# Patient Record
Sex: Female | Born: 1940
Health system: Southern US, Community
[De-identification: ages and names within clinical notes are randomized; demographics above are authoritative.]

## PROBLEM LIST (undated history)

## (undated) DIAGNOSIS — R0602 Shortness of breath: Secondary | ICD-10-CM

## (undated) DIAGNOSIS — M48061 Spinal stenosis, lumbar region without neurogenic claudication: Secondary | ICD-10-CM

## (undated) DIAGNOSIS — N390 Urinary tract infection, site not specified: Secondary | ICD-10-CM

## (undated) DIAGNOSIS — M1710 Unilateral primary osteoarthritis, unspecified knee: Secondary | ICD-10-CM

## (undated) DIAGNOSIS — N200 Calculus of kidney: Secondary | ICD-10-CM

## (undated) DIAGNOSIS — E1142 Type 2 diabetes mellitus with diabetic polyneuropathy: Secondary | ICD-10-CM

## (undated) DIAGNOSIS — M7989 Other specified soft tissue disorders: Secondary | ICD-10-CM

## (undated) DIAGNOSIS — G4733 Obstructive sleep apnea (adult) (pediatric): Secondary | ICD-10-CM

## (undated) DIAGNOSIS — M170 Bilateral primary osteoarthritis of knee: Secondary | ICD-10-CM

## (undated) DIAGNOSIS — Z7409 Other reduced mobility: Secondary | ICD-10-CM

## (undated) DIAGNOSIS — M25561 Pain in right knee: Secondary | ICD-10-CM

## (undated) DIAGNOSIS — N2581 Secondary hyperparathyroidism of renal origin: Secondary | ICD-10-CM

## (undated) DIAGNOSIS — M5432 Sciatica, left side: Secondary | ICD-10-CM

## (undated) DIAGNOSIS — R700 Elevated erythrocyte sedimentation rate: Secondary | ICD-10-CM

## (undated) DIAGNOSIS — G894 Chronic pain syndrome: Secondary | ICD-10-CM

## (undated) DIAGNOSIS — E1129 Type 2 diabetes mellitus with other diabetic kidney complication: Secondary | ICD-10-CM

## (undated) HISTORY — DX: Pain in right knee: M25.561

## (undated) HISTORY — DX: Spinal stenosis, lumbar region without neurogenic claudication: M48.061

## (undated) HISTORY — PX: OTHER SURGICAL HISTORY: SHX169

## (undated) HISTORY — DX: Other reduced mobility: Z74.09

## (undated) HISTORY — DX: Unilateral primary osteoarthritis, unspecified knee: M17.10

## (undated) HISTORY — DX: Urinary tract infection, site not specified: N39.0

## (undated) HISTORY — DX: Chronic pain syndrome: G89.4

## (undated) HISTORY — DX: Type 2 diabetes mellitus with diabetic polyneuropathy: E11.42

## (undated) HISTORY — DX: Elevated erythrocyte sedimentation rate: R70.0

## (undated) HISTORY — DX: Calculus of kidney: N20.0

## (undated) HISTORY — DX: Secondary hyperparathyroidism of renal origin: N25.81

## (undated) HISTORY — DX: Sciatica, left side: M54.32

## (undated) HISTORY — DX: Obstructive sleep apnea (adult) (pediatric): G47.33

---
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
## 1991-03-06 HISTORY — PX: COLPOSCOPY: SHX161

## 1993-07-05 HISTORY — PX: OTHER SURGICAL HISTORY: SHX169

## 1998-09-17 ENCOUNTER — Encounter: Admission: RE | Admit: 1998-09-17 | Discharge: 1998-09-17 | Payer: Self-pay | Admitting: Family Medicine

## 1998-10-21 ENCOUNTER — Encounter: Admission: RE | Admit: 1998-10-21 | Discharge: 1998-10-21 | Payer: Self-pay | Admitting: Family Medicine

## 1998-12-03 ENCOUNTER — Encounter: Admission: RE | Admit: 1998-12-03 | Discharge: 1998-12-03 | Payer: Self-pay | Admitting: Family Medicine

## 1999-07-18 ENCOUNTER — Encounter: Admission: RE | Admit: 1999-07-18 | Discharge: 1999-07-18 | Payer: Self-pay | Admitting: Family Medicine

## 1999-09-24 ENCOUNTER — Encounter: Admission: RE | Admit: 1999-09-24 | Discharge: 1999-09-24 | Payer: Self-pay | Admitting: Family Medicine

## 1999-11-04 ENCOUNTER — Encounter: Admission: RE | Admit: 1999-11-04 | Discharge: 1999-11-04 | Payer: Self-pay | Admitting: Family Medicine

## 1999-11-26 ENCOUNTER — Encounter: Admission: RE | Admit: 1999-11-26 | Discharge: 1999-11-26 | Payer: Self-pay | Admitting: Family Medicine

## 1999-12-23 ENCOUNTER — Encounter: Admission: RE | Admit: 1999-12-23 | Discharge: 1999-12-23 | Payer: Self-pay | Admitting: Family Medicine

## 2000-01-02 ENCOUNTER — Encounter: Admission: RE | Admit: 2000-01-02 | Discharge: 2000-01-02 | Payer: Self-pay | Admitting: Family Medicine

## 2000-02-24 ENCOUNTER — Encounter: Admission: RE | Admit: 2000-02-24 | Discharge: 2000-02-24 | Payer: Self-pay | Admitting: Sports Medicine

## 2000-03-30 ENCOUNTER — Encounter: Admission: RE | Admit: 2000-03-30 | Discharge: 2000-03-30 | Payer: Self-pay | Admitting: Family Medicine

## 2000-05-21 ENCOUNTER — Encounter: Admission: RE | Admit: 2000-05-21 | Discharge: 2000-05-21 | Payer: Self-pay | Admitting: Family Medicine

## 2000-07-20 ENCOUNTER — Encounter: Admission: RE | Admit: 2000-07-20 | Discharge: 2000-07-20 | Payer: Self-pay | Admitting: Family Medicine

## 2000-07-27 ENCOUNTER — Encounter: Admission: RE | Admit: 2000-07-27 | Discharge: 2000-07-27 | Payer: Self-pay | Admitting: Family Medicine

## 2000-08-03 ENCOUNTER — Encounter: Admission: RE | Admit: 2000-08-03 | Discharge: 2000-08-03 | Payer: Self-pay | Admitting: Family Medicine

## 2000-10-12 ENCOUNTER — Encounter: Admission: RE | Admit: 2000-10-12 | Discharge: 2000-10-12 | Payer: Self-pay | Admitting: Family Medicine

## 2000-11-17 ENCOUNTER — Encounter: Admission: RE | Admit: 2000-11-17 | Discharge: 2000-11-17 | Payer: Self-pay | Admitting: Family Medicine

## 2001-04-04 DIAGNOSIS — M179 Osteoarthritis of knee, unspecified: Secondary | ICD-10-CM

## 2001-04-04 DIAGNOSIS — M1710 Unilateral primary osteoarthritis, unspecified knee: Secondary | ICD-10-CM

## 2001-04-04 HISTORY — DX: Osteoarthritis of knee, unspecified: M17.9

## 2001-04-04 HISTORY — DX: Unilateral primary osteoarthritis, unspecified knee: M17.10

## 2001-04-19 ENCOUNTER — Encounter: Admission: RE | Admit: 2001-04-19 | Discharge: 2001-04-19 | Payer: Self-pay | Admitting: Family Medicine

## 2001-04-19 ENCOUNTER — Encounter: Payer: Self-pay | Admitting: Family Medicine

## 2001-05-04 ENCOUNTER — Encounter: Admission: RE | Admit: 2001-05-04 | Discharge: 2001-05-04 | Payer: Self-pay | Admitting: Family Medicine

## 2001-09-20 ENCOUNTER — Encounter: Admission: RE | Admit: 2001-09-20 | Discharge: 2001-09-20 | Payer: Self-pay | Admitting: Family Medicine

## 2001-10-14 ENCOUNTER — Encounter: Admission: RE | Admit: 2001-10-14 | Discharge: 2001-10-14 | Payer: Self-pay | Admitting: Family Medicine

## 2002-05-09 ENCOUNTER — Encounter: Admission: RE | Admit: 2002-05-09 | Discharge: 2002-05-09 | Payer: Self-pay | Admitting: Family Medicine

## 2002-06-23 ENCOUNTER — Encounter: Admission: RE | Admit: 2002-06-23 | Discharge: 2002-06-23 | Payer: Self-pay | Admitting: Family Medicine

## 2002-07-17 ENCOUNTER — Encounter: Admission: RE | Admit: 2002-07-17 | Discharge: 2002-07-17 | Payer: Self-pay | Admitting: Family Medicine

## 2002-07-18 ENCOUNTER — Encounter: Admission: RE | Admit: 2002-07-18 | Discharge: 2002-07-18 | Payer: Self-pay | Admitting: Sports Medicine

## 2002-07-18 ENCOUNTER — Encounter: Payer: Self-pay | Admitting: Sports Medicine

## 2002-09-12 ENCOUNTER — Encounter: Admission: RE | Admit: 2002-09-12 | Discharge: 2002-09-12 | Payer: Self-pay | Admitting: Family Medicine

## 2002-10-10 ENCOUNTER — Encounter: Admission: RE | Admit: 2002-10-10 | Discharge: 2002-10-10 | Payer: Self-pay | Admitting: Family Medicine

## 2003-02-02 ENCOUNTER — Encounter: Admission: RE | Admit: 2003-02-02 | Discharge: 2003-02-02 | Payer: Self-pay | Admitting: Family Medicine

## 2003-02-06 ENCOUNTER — Encounter: Admission: RE | Admit: 2003-02-06 | Discharge: 2003-02-06 | Payer: Self-pay | Admitting: Family Medicine

## 2003-04-03 ENCOUNTER — Encounter: Admission: RE | Admit: 2003-04-03 | Discharge: 2003-04-03 | Payer: Self-pay | Admitting: Family Medicine

## 2003-04-25 ENCOUNTER — Encounter: Admission: RE | Admit: 2003-04-25 | Discharge: 2003-04-25 | Payer: Self-pay | Admitting: Family Medicine

## 2003-08-02 ENCOUNTER — Encounter: Admission: RE | Admit: 2003-08-02 | Discharge: 2003-08-02 | Payer: Self-pay | Admitting: Family Medicine

## 2003-11-23 ENCOUNTER — Encounter: Admission: RE | Admit: 2003-11-23 | Discharge: 2003-11-23 | Payer: Self-pay | Admitting: Family Medicine

## 2003-11-24 ENCOUNTER — Emergency Department (HOSPITAL_COMMUNITY): Admission: EM | Admit: 2003-11-24 | Discharge: 2003-11-24 | Payer: Self-pay | Admitting: Family Medicine

## 2003-12-04 ENCOUNTER — Encounter: Admission: RE | Admit: 2003-12-04 | Discharge: 2003-12-04 | Payer: Self-pay | Admitting: Family Medicine

## 2003-12-04 DIAGNOSIS — M5432 Sciatica, left side: Secondary | ICD-10-CM

## 2003-12-04 HISTORY — DX: Sciatica, left side: M54.32

## 2003-12-18 ENCOUNTER — Encounter: Admission: RE | Admit: 2003-12-18 | Discharge: 2003-12-18 | Payer: Self-pay | Admitting: Family Medicine

## 2003-12-25 ENCOUNTER — Encounter: Admission: RE | Admit: 2003-12-25 | Discharge: 2003-12-25 | Payer: Self-pay | Admitting: Family Medicine

## 2003-12-25 ENCOUNTER — Encounter: Admission: RE | Admit: 2003-12-25 | Discharge: 2003-12-25 | Payer: Self-pay | Admitting: Sports Medicine

## 2004-01-01 ENCOUNTER — Encounter: Admission: RE | Admit: 2004-01-01 | Discharge: 2004-01-01 | Payer: Self-pay | Admitting: Family Medicine

## 2004-01-15 ENCOUNTER — Encounter: Admission: RE | Admit: 2004-01-15 | Discharge: 2004-01-15 | Payer: Self-pay | Admitting: Family Medicine

## 2004-01-23 ENCOUNTER — Encounter: Admission: RE | Admit: 2004-01-23 | Discharge: 2004-02-29 | Payer: Self-pay | Admitting: Family Medicine

## 2004-01-29 ENCOUNTER — Encounter: Admission: RE | Admit: 2004-01-29 | Discharge: 2004-01-29 | Payer: Self-pay | Admitting: Sports Medicine

## 2004-02-26 ENCOUNTER — Encounter: Admission: RE | Admit: 2004-02-26 | Discharge: 2004-02-26 | Payer: Self-pay | Admitting: Sports Medicine

## 2004-03-21 ENCOUNTER — Encounter: Admission: RE | Admit: 2004-03-21 | Discharge: 2004-03-21 | Payer: Self-pay | Admitting: Family Medicine

## 2004-03-25 ENCOUNTER — Encounter: Admission: RE | Admit: 2004-03-25 | Discharge: 2004-03-25 | Payer: Self-pay | Admitting: Family Medicine

## 2004-03-29 ENCOUNTER — Encounter: Admission: RE | Admit: 2004-03-29 | Discharge: 2004-03-29 | Payer: Self-pay | Admitting: Family Medicine

## 2004-04-04 ENCOUNTER — Encounter (INDEPENDENT_AMBULATORY_CARE_PROVIDER_SITE_OTHER): Payer: Self-pay | Admitting: *Deleted

## 2004-04-04 DIAGNOSIS — M48061 Spinal stenosis, lumbar region without neurogenic claudication: Secondary | ICD-10-CM

## 2004-04-04 HISTORY — DX: Spinal stenosis, lumbar region without neurogenic claudication: M48.061

## 2004-04-04 LAB — CONVERTED CEMR LAB

## 2004-04-22 ENCOUNTER — Encounter: Admission: RE | Admit: 2004-04-22 | Discharge: 2004-04-22 | Payer: Self-pay | Admitting: Family Medicine

## 2004-04-22 ENCOUNTER — Encounter: Payer: Self-pay | Admitting: Family Medicine

## 2004-05-06 ENCOUNTER — Encounter: Admission: RE | Admit: 2004-05-06 | Discharge: 2004-05-06 | Payer: Self-pay | Admitting: Family Medicine

## 2004-07-21 ENCOUNTER — Ambulatory Visit: Payer: Self-pay | Admitting: Sports Medicine

## 2004-09-03 ENCOUNTER — Emergency Department (HOSPITAL_COMMUNITY): Admission: EM | Admit: 2004-09-03 | Discharge: 2004-09-03 | Payer: Self-pay | Admitting: Family Medicine

## 2004-09-09 ENCOUNTER — Ambulatory Visit: Payer: Self-pay | Admitting: Family Medicine

## 2004-10-07 ENCOUNTER — Ambulatory Visit: Payer: Self-pay | Admitting: Family Medicine

## 2004-12-02 ENCOUNTER — Ambulatory Visit: Payer: Self-pay | Admitting: Family Medicine

## 2004-12-09 ENCOUNTER — Ambulatory Visit: Payer: Self-pay | Admitting: Family Medicine

## 2004-12-30 ENCOUNTER — Ambulatory Visit: Payer: Self-pay | Admitting: Family Medicine

## 2005-01-05 ENCOUNTER — Emergency Department (HOSPITAL_COMMUNITY): Admission: EM | Admit: 2005-01-05 | Discharge: 2005-01-05 | Payer: Self-pay | Admitting: Family Medicine

## 2005-07-07 ENCOUNTER — Ambulatory Visit: Payer: Self-pay | Admitting: Family Medicine

## 2006-04-06 ENCOUNTER — Ambulatory Visit: Payer: Self-pay | Admitting: Family Medicine

## 2006-07-14 ENCOUNTER — Ambulatory Visit: Payer: Self-pay | Admitting: Sports Medicine

## 2006-09-03 ENCOUNTER — Ambulatory Visit: Payer: Self-pay | Admitting: Family Medicine

## 2006-09-07 ENCOUNTER — Ambulatory Visit: Payer: Self-pay | Admitting: Family Medicine

## 2006-12-02 DIAGNOSIS — M5136 Other intervertebral disc degeneration, lumbar region: Secondary | ICD-10-CM

## 2006-12-02 DIAGNOSIS — F339 Major depressive disorder, recurrent, unspecified: Secondary | ICD-10-CM | POA: Insufficient documentation

## 2006-12-02 DIAGNOSIS — E1149 Type 2 diabetes mellitus with other diabetic neurological complication: Secondary | ICD-10-CM | POA: Insufficient documentation

## 2006-12-02 DIAGNOSIS — J329 Chronic sinusitis, unspecified: Secondary | ICD-10-CM | POA: Insufficient documentation

## 2006-12-02 DIAGNOSIS — M159 Polyosteoarthritis, unspecified: Secondary | ICD-10-CM | POA: Insufficient documentation

## 2006-12-02 DIAGNOSIS — I2510 Atherosclerotic heart disease of native coronary artery without angina pectoris: Secondary | ICD-10-CM

## 2006-12-02 DIAGNOSIS — I10 Essential (primary) hypertension: Secondary | ICD-10-CM

## 2006-12-02 DIAGNOSIS — E7800 Pure hypercholesterolemia, unspecified: Secondary | ICD-10-CM

## 2006-12-02 DIAGNOSIS — N3941 Urge incontinence: Secondary | ICD-10-CM

## 2006-12-03 ENCOUNTER — Encounter (INDEPENDENT_AMBULATORY_CARE_PROVIDER_SITE_OTHER): Payer: Self-pay | Admitting: *Deleted

## 2007-03-25 ENCOUNTER — Telehealth (INDEPENDENT_AMBULATORY_CARE_PROVIDER_SITE_OTHER): Payer: Self-pay

## 2007-04-18 ENCOUNTER — Encounter: Payer: Self-pay | Admitting: Family Medicine

## 2007-05-03 ENCOUNTER — Ambulatory Visit: Payer: Self-pay | Admitting: Family Medicine

## 2007-05-03 ENCOUNTER — Encounter: Payer: Self-pay | Admitting: Family Medicine

## 2007-05-03 LAB — CONVERTED CEMR LAB
BUN: 26 mg/dL — ABNORMAL HIGH (ref 6–23)
CO2: 24 meq/L (ref 19–32)
Calcium: 9.4 mg/dL (ref 8.4–10.5)
Chloride: 100 meq/L (ref 96–112)
Cholesterol: 139 mg/dL (ref 0–200)
Creatinine, Ser: 1.1 mg/dL (ref 0.40–1.20)
Glucose, Bld: 95 mg/dL (ref 70–99)
HDL: 39 mg/dL — ABNORMAL LOW (ref >39)
LDL Cholesterol: 52 mg/dL (ref 0–99)
Potassium: 4.6 meq/L (ref 3.5–5.3)
Sodium: 138 meq/L (ref 135–145)
TSH: 1.614 microintl units/mL (ref 0.350–5.50)
Total CHOL/HDL Ratio: 3.6
Triglycerides: 241 mg/dL — ABNORMAL HIGH (ref <150)
VLDL: 48 mg/dL — ABNORMAL HIGH (ref 0–40)

## 2007-05-04 ENCOUNTER — Telehealth: Payer: Self-pay | Admitting: Family Medicine

## 2007-05-04 ENCOUNTER — Encounter: Payer: Self-pay | Admitting: Family Medicine

## 2007-07-20 ENCOUNTER — Ambulatory Visit: Payer: Self-pay | Admitting: Family Medicine

## 2007-08-06 ENCOUNTER — Emergency Department (HOSPITAL_COMMUNITY): Admission: EM | Admit: 2007-08-06 | Discharge: 2007-08-06 | Payer: Self-pay | Admitting: Emergency Medicine

## 2007-08-11 ENCOUNTER — Ambulatory Visit: Payer: Self-pay | Admitting: Family Medicine

## 2008-05-22 ENCOUNTER — Telehealth: Payer: Self-pay | Admitting: Family Medicine

## 2008-06-05 ENCOUNTER — Ambulatory Visit: Payer: Self-pay | Admitting: Family Medicine

## 2008-06-05 LAB — CONVERTED CEMR LAB
AST: 15 units/L (ref 0–37)
Albumin: 4.4 g/dL (ref 3.5–5.2)
BUN: 25 mg/dL — ABNORMAL HIGH (ref 6–23)
CO2: 24 meq/L (ref 19–32)
Calcium: 9.3 mg/dL (ref 8.4–10.5)
Chloride: 97 meq/L (ref 96–112)
Cholesterol: 142 mg/dL (ref 0–200)
Glucose, Bld: 161 mg/dL — ABNORMAL HIGH (ref 70–99)
HDL: 34 mg/dL — ABNORMAL LOW (ref >39)
Hemoglobin: 15.1 g/dL — ABNORMAL HIGH (ref 12.0–15.0)
Potassium: 3.8 meq/L (ref 3.5–5.3)
RBC: 4.86 M/uL (ref 3.87–5.11)
Triglycerides: 350 mg/dL — ABNORMAL HIGH (ref <150)
Uric Acid, Serum: 8.2 mg/dL — ABNORMAL HIGH (ref 2.4–7.0)
WBC: 9.9 10*3/uL (ref 4.0–10.5)

## 2008-06-07 ENCOUNTER — Encounter: Payer: Self-pay | Admitting: Family Medicine

## 2008-06-27 ENCOUNTER — Ambulatory Visit: Payer: Self-pay | Admitting: Family Medicine

## 2009-01-08 ENCOUNTER — Ambulatory Visit: Payer: Self-pay | Admitting: Family Medicine

## 2009-01-08 DIAGNOSIS — J301 Allergic rhinitis due to pollen: Secondary | ICD-10-CM

## 2009-01-22 ENCOUNTER — Ambulatory Visit (HOSPITAL_COMMUNITY): Admission: RE | Admit: 2009-01-22 | Discharge: 2009-01-22 | Payer: Self-pay | Admitting: Family Medicine

## 2009-01-22 ENCOUNTER — Ambulatory Visit: Payer: Self-pay | Admitting: Family Medicine

## 2009-01-22 DIAGNOSIS — I4821 Permanent atrial fibrillation: Secondary | ICD-10-CM

## 2009-01-22 LAB — CONVERTED CEMR LAB
CO2: 25 meq/L (ref 19–32)
Chloride: 99 meq/L (ref 96–112)
Creatinine, Ser: 1.06 mg/dL (ref 0.40–1.20)
HCT: 42.2 % (ref 36.0–46.0)
Hemoglobin: 14.4 g/dL (ref 12.0–15.0)
RDW: 14.3 % (ref 11.5–15.5)
aPTT: 30 s (ref 24–37)

## 2009-01-24 ENCOUNTER — Telehealth (INDEPENDENT_AMBULATORY_CARE_PROVIDER_SITE_OTHER): Payer: Self-pay | Admitting: *Deleted

## 2009-01-25 ENCOUNTER — Encounter: Payer: Self-pay | Admitting: Family Medicine

## 2009-01-28 ENCOUNTER — Encounter: Payer: Self-pay | Admitting: Family Medicine

## 2009-01-29 ENCOUNTER — Ambulatory Visit: Payer: Self-pay | Admitting: Family Medicine

## 2009-01-29 ENCOUNTER — Ambulatory Visit (HOSPITAL_COMMUNITY): Admission: RE | Admit: 2009-01-29 | Discharge: 2009-01-29 | Payer: Self-pay | Admitting: Family Medicine

## 2009-01-30 ENCOUNTER — Encounter: Payer: Self-pay | Admitting: Family Medicine

## 2009-01-30 ENCOUNTER — Ambulatory Visit (HOSPITAL_COMMUNITY): Admission: RE | Admit: 2009-01-30 | Discharge: 2009-01-30 | Payer: Self-pay | Admitting: Family Medicine

## 2009-01-30 ENCOUNTER — Ambulatory Visit: Payer: Self-pay | Admitting: Internal Medicine

## 2009-01-31 ENCOUNTER — Ambulatory Visit: Payer: Self-pay | Admitting: Family Medicine

## 2009-01-31 ENCOUNTER — Encounter: Payer: Self-pay | Admitting: Internal Medicine

## 2009-01-31 ENCOUNTER — Encounter: Payer: Self-pay | Admitting: Family Medicine

## 2009-01-31 LAB — CONVERTED CEMR LAB: INR: 1.7

## 2009-02-01 ENCOUNTER — Ambulatory Visit: Payer: Self-pay | Admitting: Family Medicine

## 2009-02-01 ENCOUNTER — Encounter: Payer: Self-pay | Admitting: Family Medicine

## 2009-02-01 LAB — CONVERTED CEMR LAB: INR: 2.7

## 2009-02-04 ENCOUNTER — Ambulatory Visit: Payer: Self-pay | Admitting: Family Medicine

## 2009-02-08 ENCOUNTER — Ambulatory Visit: Payer: Self-pay | Admitting: Family Medicine

## 2009-02-12 ENCOUNTER — Ambulatory Visit: Payer: Self-pay | Admitting: Family Medicine

## 2009-02-12 LAB — CONVERTED CEMR LAB: INR: 2.3

## 2009-02-19 ENCOUNTER — Ambulatory Visit: Payer: Self-pay | Admitting: Family Medicine

## 2009-02-19 LAB — CONVERTED CEMR LAB: INR: 1.9

## 2009-02-20 ENCOUNTER — Ambulatory Visit: Payer: Self-pay | Admitting: Internal Medicine

## 2009-02-26 ENCOUNTER — Ambulatory Visit: Payer: Self-pay | Admitting: Family Medicine

## 2009-02-26 LAB — CONVERTED CEMR LAB: INR: 1.9

## 2009-03-05 ENCOUNTER — Ambulatory Visit: Payer: Self-pay | Admitting: Family Medicine

## 2009-03-05 LAB — CONVERTED CEMR LAB: INR: 1.8

## 2009-03-19 ENCOUNTER — Ambulatory Visit: Payer: Self-pay | Admitting: Family Medicine

## 2009-04-16 ENCOUNTER — Ambulatory Visit: Payer: Self-pay | Admitting: Family Medicine

## 2009-04-16 LAB — CONVERTED CEMR LAB: INR: 2.4

## 2009-05-14 ENCOUNTER — Ambulatory Visit: Payer: Self-pay | Admitting: Family Medicine

## 2009-05-14 LAB — CONVERTED CEMR LAB

## 2009-06-13 ENCOUNTER — Ambulatory Visit: Payer: Self-pay | Admitting: Family Medicine

## 2009-07-11 ENCOUNTER — Ambulatory Visit: Payer: Self-pay | Admitting: Family Medicine

## 2009-08-08 ENCOUNTER — Ambulatory Visit: Payer: Self-pay | Admitting: Family Medicine

## 2009-08-08 LAB — CONVERTED CEMR LAB: INR: 2.4

## 2009-09-03 ENCOUNTER — Ambulatory Visit: Payer: Self-pay | Admitting: Family Medicine

## 2009-09-03 LAB — CONVERTED CEMR LAB
Hgb A1c MFr Bld: 6.5 %
INR: 2.2

## 2009-09-04 ENCOUNTER — Encounter: Payer: Self-pay | Admitting: Family Medicine

## 2009-09-04 LAB — CONVERTED CEMR LAB
ALT: 12 units/L (ref 0–35)
AST: 12 units/L (ref 0–37)
Albumin: 4.4 g/dL (ref 3.5–5.2)
Alkaline Phosphatase: 54 units/L (ref 39–117)
BUN: 24 mg/dL — ABNORMAL HIGH (ref 6–23)
Calcium: 9.8 mg/dL (ref 8.4–10.5)
Chloride: 101 meq/L (ref 96–112)
Potassium: 4.2 meq/L (ref 3.5–5.3)
Sodium: 139 meq/L (ref 135–145)
Total Protein: 7 g/dL (ref 6.0–8.3)

## 2009-09-24 ENCOUNTER — Encounter: Payer: Self-pay | Admitting: Family Medicine

## 2009-10-01 ENCOUNTER — Ambulatory Visit: Payer: Self-pay | Admitting: Family Medicine

## 2009-10-23 ENCOUNTER — Encounter: Payer: Self-pay | Admitting: Family Medicine

## 2009-10-23 ENCOUNTER — Telehealth: Payer: Self-pay | Admitting: Family Medicine

## 2009-10-29 ENCOUNTER — Encounter: Payer: Self-pay | Admitting: Family Medicine

## 2009-10-29 ENCOUNTER — Ambulatory Visit: Payer: Self-pay | Admitting: Family Medicine

## 2009-10-29 LAB — CONVERTED CEMR LAB
HDL: 38 mg/dL — ABNORMAL LOW (ref >39)
INR: 2.6
LDL Cholesterol: 30 mg/dL (ref 0–99)

## 2009-11-12 ENCOUNTER — Ambulatory Visit: Payer: Self-pay | Admitting: Family Medicine

## 2009-11-26 ENCOUNTER — Ambulatory Visit: Payer: Self-pay | Admitting: Family Medicine

## 2009-11-26 ENCOUNTER — Encounter: Payer: Self-pay | Admitting: Family Medicine

## 2009-11-26 LAB — CONVERTED CEMR LAB
HCT: 46.8 % — ABNORMAL HIGH (ref 36.0–46.0)
Hemoglobin: 15.6 g/dL — ABNORMAL HIGH (ref 12.0–15.0)
INR: 2.3
MCHC: 33.3 g/dL (ref 30.0–36.0)
MCV: 94 fL (ref 78.0–100.0)
RBC: 4.98 M/uL (ref 3.87–5.11)

## 2009-11-27 ENCOUNTER — Encounter: Payer: Self-pay | Admitting: Family Medicine

## 2009-12-13 ENCOUNTER — Encounter: Payer: Self-pay | Admitting: Family Medicine

## 2009-12-16 ENCOUNTER — Encounter: Payer: Self-pay | Admitting: Family Medicine

## 2009-12-24 ENCOUNTER — Ambulatory Visit: Payer: Self-pay | Admitting: Family Medicine

## 2010-01-21 ENCOUNTER — Ambulatory Visit: Payer: Self-pay | Admitting: Family Medicine

## 2010-02-06 ENCOUNTER — Encounter: Payer: Self-pay | Admitting: Family Medicine

## 2010-02-18 ENCOUNTER — Ambulatory Visit: Payer: Self-pay | Admitting: Family Medicine

## 2010-03-04 ENCOUNTER — Ambulatory Visit: Payer: Self-pay | Admitting: Family Medicine

## 2010-03-04 LAB — CONVERTED CEMR LAB
CO2: 24 meq/L (ref 19–32)
Chloride: 101 meq/L (ref 96–112)
Glucose, Bld: 106 mg/dL — ABNORMAL HIGH (ref 70–99)
Potassium: 4.3 meq/L (ref 3.5–5.3)
Sodium: 139 meq/L (ref 135–145)

## 2010-03-05 ENCOUNTER — Encounter: Payer: Self-pay | Admitting: Family Medicine

## 2010-03-18 ENCOUNTER — Ambulatory Visit: Payer: Self-pay | Admitting: Family Medicine

## 2010-03-25 ENCOUNTER — Encounter: Payer: Self-pay | Admitting: *Deleted

## 2010-04-15 ENCOUNTER — Ambulatory Visit: Payer: Self-pay | Admitting: Family Medicine

## 2010-05-20 ENCOUNTER — Ambulatory Visit: Payer: Self-pay | Admitting: Family Medicine

## 2010-05-20 LAB — CONVERTED CEMR LAB: Hgb A1c MFr Bld: 6.1 %

## 2010-06-17 ENCOUNTER — Ambulatory Visit: Payer: Self-pay | Admitting: Family Medicine

## 2010-06-17 LAB — CONVERTED CEMR LAB: INR: 2.6

## 2010-07-15 ENCOUNTER — Ambulatory Visit: Payer: Self-pay | Admitting: Family Medicine

## 2010-08-12 ENCOUNTER — Ambulatory Visit: Payer: Self-pay | Admitting: Family Medicine

## 2010-08-12 LAB — CONVERTED CEMR LAB: INR: 2.3

## 2010-09-09 ENCOUNTER — Ambulatory Visit: Payer: Self-pay | Admitting: Family Medicine

## 2010-09-09 LAB — CONVERTED CEMR LAB: INR: 2.5

## 2010-09-16 ENCOUNTER — Ambulatory Visit: Payer: Self-pay | Admitting: Family Medicine

## 2010-09-16 LAB — CONVERTED CEMR LAB: Hgb A1c MFr Bld: 6.4 %

## 2010-10-14 ENCOUNTER — Ambulatory Visit: Admission: RE | Admit: 2010-10-14 | Discharge: 2010-10-14 | Payer: Self-pay | Source: Home / Self Care

## 2010-11-04 NOTE — Letter (Signed)
Summary: Results Follow-up Letter  [facility]  [address]   [place], [place] [address]   Phone: [phone]  Fax: [phone]    03/05/2010  [place], [place]  [address]  Dear Ms. Degidio,   The following are the results of your recent test(s):  Tests: (1) Basic Metabolic Panel (A999333)   Sodium                    139 mEq/L                   135-145   Potassium                 4.3 mEq/L                   3.5-5.3   Chloride                  101 mEq/L                   96-112   CO2                       24 mEq/L                    19-32   Glucose              [H]  106 mg/dL                   70-99   BUN                  [H]  24 mg/dL                    6-23   Creatinine                1.07 mg/dL                  0.40-1.20   Calcium                   9.4 mg/dL                   8.4-10.5  Your kidney function remains good. I added Enalapril 20 mg to take one daily to decrease your blood pressure and protect your kidneys. Losing weight will also help your blood pressure. Please make an appointment in one month to check your blood pressure  and kidney function.  Don't forget to schedule your mammogram. We could also do your last pap smear then.   03/04/2010 22:49 Sincerely,  Candelaria Celeste MD [facility]           Appended Document: Results Follow-up Letter mailed.

## 2010-11-04 NOTE — Assessment & Plan Note (Signed)
Summary: F/U VISIT AND INR CHECK/[facility]   Vital Signs:  Patient profile:   70 year old female Height:      63 inches Weight:      263 pounds BMI:     46.76 Pulse rate:   92 / minute BP sitting:   130 / 88  (right arm)  Vitals Entered By: Mauricia Area CMA, (May 20, 2010 3:03 PM) CC: f/up DM and refill meds. Is Patient Diabetic? Yes Pain Assessment Patient in pain? no        Primary Care Provider:  Candelaria Celeste MD  CC:  f/up DM and refill meds..  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   Her older son is now walking as he recovers from the car accident.   Denies chest pain or palpitations  No hyppglycemic symptoms   Habits & Providers  Alcohol-Tobacco-Diet     Tobacco Status: never  Current Medications (verified): 1)  Acetaminophen 500 Mg Tabs (Acetaminophen) .... As Needed 2)  Nitrostat 0.4 Mg Subl (Nitroglycerin) .... Place 1 Tablet Under Tongue As Directed 3)  Procardia Xl 90 Mg Xr24h-Tab (Nifedipine) .... Take One Tablet Daily 4)  Metoprolol Succinate 100 Mg Xr24h-Tab (Metoprolol Succinate) .... Take One Tablet Twice Daily 5)  Flonase 50 Mcg/act  Susp (Fluticasone Propionate) .... Two Sprays in Each Nostril Daily 6)  Zyrtec Allergy 10 Mg Tabs (Cetirizine Hcl) .... Take One Tablet Daily 7)  Coumadin 5 Mg Tabs (Warfarin Sodium) .... Take As Directed 8)  Metformin Hcl 500 Mg Tabs (Metformin Hcl) .... Take One Tablet Two Times A Day 9)  Enalapril-Hydrochlorothiazide 10-25 Mg Tabs (Enalapril-Hydrochlorothiazide) .... Take One Tablet Daily 10)  Crestor 20 Mg Tabs (Rosuvastatin Calcium) .Marland Kitchen.. 1 Tablet By Mouth Daily At Bedtime 11)  Hydrocodone-Acetaminophen 7.5-500 Mg Tabs (Hydrocodone-Acetaminophen) .... Take One Tab Every 4 Hr Prn 12)  Enalapril Maleate 10 Mg Tabs (Enalapril Maleate) .... Take One Tab Daily 13)  Zostavax 19400 Unt/0.69ml Solr (Zoster Vaccine Live) .... Take As  Directed  Allergies (verified): 1)  Lisinopril (Lisinopril)  Physical Exam  General:  alert, well-nourished, and a very overweight-appearing.   Lungs:  Normal respiratory effort, chest expands symmetrically. Lungs are clear to auscultation, no crackles or wheezes. Heart:  irregularly irregular rhythm at apical rate of 80.  S1 and S2 normal without gallop, murmur, click, rub or other extra sounds.  Extremities:  trace left pedal edema and trace right pedal edema.     Impression & Recommendations:  Problem # 1:  DM (ICD-250.00) Assessment Improved  Her updated medication list for this problem includes:    Metformin Hcl 500 Mg Tabs (Metformin hcl) .Marland Kitchen... Take one tablet two times a day    Enalapril-hydrochlorothiazide 10-25 Mg Tabs (Enalapril-hydrochlorothiazide) .Marland Kitchen... Take one tablet daily    Enalapril Maleate 10 Mg Tabs (Enalapril maleate) .Marland Kitchen... Take one tab daily  Orders: A1C-[facility] [id number]) [place]- Est  Level 4 [id number])  Problem # 2:  SCIATICA (ICD-724.3) Assessment: Unchanged  Her updated medication list for this problem includes:    Acetaminophen 500 Mg Tabs (Acetaminophen) .Marland Kitchen... As needed    Hydrocodone-acetaminophen 7.5-500 Mg Tabs (Hydrocodone-acetaminophen) .Marland Kitchen... Take one tab every 4 hr prn  Orders: [place]- Est  Level 4 [id number])  Problem # 3:  ATRIAL FIBRILLATION (ICD-427.31) Assessment: Unchanged  Her updated medication list for this problem includes:    Procardia Xl 90 Mg Xr24h-tab (Nifedipine) .Marland Kitchen... Take one tablet daily  Metoprolol Succinate 100 Mg Xr24h-tab (Metoprolol succinate) .Marland Kitchen... Take one tablet twice daily    Coumadin 5 Mg Tabs (Warfarin sodium) .Marland Kitchen... Take as directed  Orders: [place]- Est  Level 4 ([id number])  Problem # 4:  OBESITY, NOS (ICD-278.00) 1 lb weight loss Orders: [place]- Est  Level 4 [id number])  Problem # 5:  HYPERTENSION, BENIGN SYSTEMIC (ICD-401.1) Assessment: Improved  Her updated medication list for this problem includes:    Procardia Xl 90 Mg  Xr24h-tab (Nifedipine) .Marland Kitchen... Take one tablet daily    Metoprolol Succinate 100 Mg Xr24h-tab (Metoprolol succinate) .Marland Kitchen... Take one tablet twice daily    Enalapril-hydrochlorothiazide 10-25 Mg Tabs (Enalapril-hydrochlorothiazide) .Marland Kitchen... Take one tablet daily    Enalapril Maleate 10 Mg Tabs (Enalapril maleate) .Marland Kitchen... Take one tab daily  Orders: [place]- Est  Level 4 [id number])  Problem # 6:  CORONARY, ARTERIOSCLEROSIS (ICD-414.00) no symptoms  Her updated medication list for this problem includes:    Nitrostat 0.4 Mg Subl (Nitroglycerin) .Marland Kitchen... Place 1 tablet under tongue as directed    Procardia Xl 90 Mg Xr24h-tab (Nifedipine) .Marland Kitchen... Take one tablet daily    Metoprolol Succinate 100 Mg Xr24h-tab (Metoprolol succinate) .Marland Kitchen... Take one tablet twice daily    Enalapril-hydrochlorothiazide 10-25 Mg Tabs (Enalapril-hydrochlorothiazide) .Marland Kitchen... Take one tablet daily    Enalapril Maleate 10 Mg Tabs (Enalapril maleate) .Marland Kitchen... Take one tab daily  Complete Medication List: 1)  Acetaminophen 500 Mg Tabs (Acetaminophen) .... As needed 2)  Nitrostat 0.4 Mg Subl (Nitroglycerin) .... Place 1 tablet under tongue as directed 3)  Procardia Xl 90 Mg Xr24h-tab (Nifedipine) .... Take one tablet daily 4)  Metoprolol Succinate 100 Mg Xr24h-tab (Metoprolol succinate) .... Take one tablet twice daily 5)  Flonase 50 Mcg/act Susp (Fluticasone propionate) .... Two sprays in each nostril daily 6)  Zyrtec Allergy 10 Mg Tabs (Cetirizine hcl) .... Take one tablet daily 7)  Coumadin 5 Mg Tabs (Warfarin sodium) .... Take as directed 8)  Metformin Hcl 500 Mg Tabs (Metformin hcl) .... Take one tablet two times a day 9)  Enalapril-hydrochlorothiazide 10-25 Mg Tabs (Enalapril-hydrochlorothiazide) .... Take one tablet daily 10)  Crestor 20 Mg Tabs (Rosuvastatin calcium) .Marland Kitchen.. 1 tablet by mouth daily at bedtime 11)  Hydrocodone-acetaminophen 7.5-500 Mg Tabs (Hydrocodone-acetaminophen) .... Take one tab every 4 hr prn 12)  Enalapril Maleate 10 Mg  Tabs (Enalapril maleate) .... Take one tab daily 13)  Zostavax 19400 Unt/0.18ml Solr (Zoster vaccine live) .... Take as directed  Other Orders: INR/PT-[facility] [id number])  Patient Instructions: 1)  Try Fiber One cereal for fiber 2)  Please schedule a follow-up appointment in 3 months .  Prescriptions: HYDROCODONE-ACETAMINOPHEN 7.5-500 MG TABS (HYDROCODONE-ACETAMINOPHEN) Take one tab every 4 hr prn  #100 x 2   Entered and Authorized by:   Candelaria Celeste MD   Signed by:   Candelaria Celeste MD on 05/20/2010   Method used:   Print then Give to Patient   RxID:   [id number] PROCARDIA XL 90 MG XR24H-TAB (NIFEDIPINE) Take one tablet daily  #90 Tablet x 3   Entered and Authorized by:   Candelaria Celeste MD   Signed by:   Candelaria Celeste MD on 05/20/2010   Method used:   Electronically to        [id number] (retail)       [address]       [place], [place]  [address]       Ph: [phone]  Fax: [phone]   RxID:   [id number]    ANTICOAGULATION RECORD PREVIOUS REGIMEN & LAB RESULTS Anticoagulation Diagnosis:  Atrial fibrillation on  01/31/2009 Previous INR Goal Range:  2-3 on  01/31/2009 Previous INR:  2.3 on  04/15/2010 Previous Coumadin Dose(mg):  5MG  TABLET on  01/31/2009 Previous Regimen:  2.5mg  Sun, Tues, Demetrius Charity; 5mg  other days on  04/15/2010  NEW REGIMEN & LAB RESULTS Current INR: 2.8 Regimen: continue 2.5mg  Sun, Tues, Thur; 5mg  other days  Provider: Dr. Walker Kehr Repeat testing in: 4 weeks Other Comments: ...........test performed by...........Marland KitchenHedy Camara, CMA   Dose has been reviewed with patient or caretaker during this visit. Reviewed by: D. Delsa Sale, CMA  Anticoagulation Visit Questionnaire Coumadin dose missed/changed:  No Abnormal Bleeding Symptoms:  No  Any diet changes including alcohol intake, vegetables or greens since the last visit:  No Any illnesses or hospitalizations since the last visit:  No Any signs of clotting since the last  visit (including chest discomfort, dizziness, shortness of breath, arm tingling, slurred speech, swelling or redness in leg):  No  MEDICATIONS ACETAMINOPHEN 500 MG TABS (ACETAMINOPHEN) as needed NITROSTAT 0.4 MG SUBL (NITROGLYCERIN) Place 1 tablet under tongue as directed PROCARDIA XL 90 MG XR24H-TAB (NIFEDIPINE) Take one tablet daily METOPROLOL SUCCINATE 100 MG XR24H-TAB (METOPROLOL SUCCINATE) Take one tablet twice daily FLONASE 50 MCG/ACT  SUSP (FLUTICASONE PROPIONATE) Two sprays in each nostril daily ZYRTEC ALLERGY 10 MG TABS (CETIRIZINE HCL) Take one tablet daily COUMADIN 5 MG TABS (WARFARIN SODIUM) Take as directed METFORMIN HCL 500 MG TABS (METFORMIN HCL) Take one tablet two times a day ENALAPRIL-HYDROCHLOROTHIAZIDE 10-25 MG TABS (ENALAPRIL-HYDROCHLOROTHIAZIDE) Take one tablet daily CRESTOR 20 MG TABS (ROSUVASTATIN CALCIUM) 1 tablet by mouth daily at bedtime HYDROCODONE-ACETAMINOPHEN 7.5-500 MG TABS (HYDROCODONE-ACETAMINOPHEN) Take one tab every 4 hr prn ENALAPRIL MALEATE 10 MG TABS (ENALAPRIL MALEATE) Take one tab daily ZOSTAVAX [address] UNT/0.65ML SOLR (ZOSTER VACCINE LIVE) Take as directed    Laboratory Results   Blood Tests   Date/Time Received: May 20, 2010 3:07 PM  Date/Time Reported: May 20, 2010 3:19 PM   HGBA1C: 6.1%   (Normal Range: Non-Diabetic - 3-6%   Control Diabetic - 6-8%)  INR: 2.8   (Normal Range: 0.88-1.12   Therap INR: 2.0-3.5) Comments: ...........test performed by...........Marland KitchenHedy Camara, CMA       Prevention & Chronic Care Immunizations   Influenza vaccine: Historical  (07/14/2009)   Influenza vaccine due: 06/27/2009    Tetanus booster: 01/04/2004: Done.   Tetanus booster due: 01/03/2014    Pneumococcal vaccine: Pneumovax (Medicare)  (03/04/2010)   Pneumococcal vaccine deferral: Not indicated  (05/14/2009)   Pneumococcal vaccine due: None    H. zoster vaccine: Not documented  Colorectal Screening   Hemoccult: Done.  (05/06/1995)    Hemoccult action/deferral: Ordered  (11/12/2009)   Hemoccult due: 05/05/1996    Colonoscopy: Not documented   Colonoscopy action/deferral: Deferred  (03/04/2010)  Other Screening   Pap smear: Done.  (04/04/2004)   Pap smear due: 04/04/2005    Mammogram: Done.  (05/05/2002)   Mammogram due: 05/06/2003    DXA bone density scan: Not documented   Smoking status: never  (05/20/2010)  Diabetes Mellitus   HgbA1C: 6.1  (05/20/2010)    Eye exam: No diabetic retinopathy.     (01/03/2010)   Eye exam due: 10/2009    Foot exam: yes  (09/03/2009)   High risk foot: Not documented   Foot care education: Not documented    Urine microalbumin/creatinine ratio: Not documented    Diabetes  flowsheet reviewed?: Yes   Progress toward A1C goal: Improved  Lipids   Total Cholesterol: 128  (10/29/2009)   LDL: 30  (10/29/2009)   LDL Direct: 59  (09/03/2009)   HDL: 38  (10/29/2009)   Triglycerides: 300  (10/29/2009)    SGOT (AST): 12  (09/03/2009)   SGPT (ALT): 12  (09/03/2009)   Alkaline phosphatase: 54  (09/03/2009)   Total bilirubin: 0.6  (09/03/2009)    Lipid flowsheet reviewed?: Yes   Progress toward LDL goal: At goal  Hypertension   Last Blood Pressure: 130 / 88  (05/20/2010)   Serum creatinine: 1.07  (03/04/2010)   Serum potassium 4.3  (03/04/2010)    Hypertension flowsheet reviewed?: Yes   Progress toward BP goal: At goal  Self-Management Support :   Personal Goals (by the next clinic visit) :     Personal A1C goal: 7  (09/03/2009)     Personal blood pressure goal: 130/80  (09/03/2009)     Personal LDL goal: 100  (09/03/2009)    Diabetes self-management support: Not documented    Hypertension self-management support: Not documented    Lipid self-management support: Not documented

## 2010-11-04 NOTE — Miscellaneous (Signed)
Summary: shingles vaccine rx   Clinical Lists Changes  Medications: Added new medication of ZOSTAVAX 91478 UNT/0.65ML SOLR (ZOSTER VACCINE LIVE) Take as directed - Signed Rx of ZOSTAVAX 29562 UNT/0.65ML SOLR (ZOSTER VACCINE LIVE) Take as directed;  #1 x 0;  Signed;  Entered by: Mauricia Area CMA,;  Authorized by: Candelaria Celeste MD;  Method used: Print then Give to Patient    Prescriptions: ZOSTAVAX 13086 UNT/0.65ML SOLR (ZOSTER VACCINE LIVE) Take as directed  #1 x 0   Entered by:   Mauricia Area CMA,   Authorized by:   Candelaria Celeste MD   Signed by:   Mauricia Area CMA, on 03/25/2010   Method used:   Print then Give to Patient   RxID:   [id number]

## 2010-11-04 NOTE — Miscellaneous (Signed)
  Clinical Lists Changes  Medications: Rx of LORTAB 7.5-500 MG/15ML ELIX (HYDROCODONE-ACETAMINOPHEN) Take one tab every four hours as needed for pain;  #100 x 1;  Signed;  Entered by: Candelaria Celeste MD;  Authorized by: Candelaria Celeste MD;  Method used: Historical Returned fax from pharmacy with one additional refill.    Prescriptions: LORTAB 7.5-500 MG/15ML ELIX (HYDROCODONE-ACETAMINOPHEN) Take one tab every four hours as needed for pain  #100 x 1   Entered and Authorized by:   Candelaria Celeste MD   Signed by:   Candelaria Celeste MD on 12/13/2009   Method used:   Historical   RxID:   [id number]

## 2010-11-04 NOTE — Miscellaneous (Signed)
Summary: hydrocodone refill  Clinical Lists Changes Received fax refill request from pharm.  Dr. Walker Kehr out.  Reviewing record, she has been getting 100 per month and the refill is timely.  Will approve the fax request. Medications: Rx of LORTAB 7.5-500 MG/15ML ELIX (HYDROCODONE-ACETAMINOPHEN) Take one tab every four hours as needed for pain;  #100 x 1;  Signed;  Entered by: Madison Hickman MD;  Authorized by: Madison Hickman MD;  Method used: Handwritten    Prescriptions: LORTAB 7.5-500 MG/15ML ELIX (HYDROCODONE-ACETAMINOPHEN) Take one tab every four hours as needed for pain  #100 x 1   Entered and Authorized by:   Madison Hickman MD   Signed by:   Madison Hickman MD on 10/23/2009   Method used:   Handwritten   RxID:   [id number]

## 2010-11-04 NOTE — Letter (Signed)
Summary: Results Follow-up Letter  [facility]  [address]   [place], [place] [address]   Phone: [phone]  Fax: [phone]    11/27/2009  [place], [place]  [address]  Dear Ms. Bellucci,   The following are the results of your recent test(s):  Patient: Abigail Duncan Note: All result statuses are Final unless otherwise noted.  Tests: (1) CBC NO Diff (Complete Blood Count) (10000)   WBC                       9.4 K/uL                    4.0-10.5   RBC                       4.98 MIL/uL                 3.87-5.11   Hemoglobin           [H]  15.6 g/dL                   12.0-15.0   Hematocrit           [H]  46.8 %                      36.0-46.0   MCV                       94.0 fL                     78.0-100.0   MCHC                      33.3 g/dL                   30.0-36.0   RDW                       13.7 %                      11.5-15.5   Platelet Count            329 K/uL                    150-400  Your blood count is on the high side of normal. Not a problem unless you are low on oxygen at times. Do you have any symptoms of sleep apnea, like snoring, pausing breathing, resting poorly?   Document Creation Date: 11/27/2009 12:04 AM  Sincerely,  Candelaria Celeste MD [facility]           Appended Document: Results Follow-up Letter mailed.
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
## 2010-11-04 NOTE — Progress Notes (Signed)
Summary: Rx Prob  Phone Note Call from Patient Call back at Home Phone [phone]   Caller: Patient Summary of Call: Pt wanted Dr. Walker Kehr to know that she did not change the Crestor.  She is still wanting to take it vs. the new medication Dr. Walker Kehr was going to put her on.  Her children paid for last months supply, but her ins should pay for it this month.  And she wants to get this called back in for her.  [facility]. Initial call taken by: Raymond Gurney,  October 23, 2009 10:12 AM  Follow-up for Phone Call        will forward this message to Dr.McDiarmid, preceptor today, in Dr. Jamison Oka absence . Follow-up by: Marcell Barlow RN,  October 23, 2009 10:18 AM  Additional Follow-up for Phone Call Additional follow up Details #1::        Please let Ms Manoogian know I sent in a prescription for Crestor to her pharmacy Additional Follow-up by: Sherren Mocha McDiarmid MD,  October 23, 2009 11:24 AM    New/Updated Medications: CRESTOR 20 MG TABS (ROSUVASTATIN CALCIUM) 1 tablet by mouth daily at bedtime Prescriptions: CRESTOR 20 MG TABS (ROSUVASTATIN CALCIUM) 1 tablet by mouth daily at bedtime  #34 x 2   Entered and Authorized by:   Sherren Mocha McDiarmid MD   Signed by:   Sherren Mocha McDiarmid MD on 10/23/2009   Method used:   [facility]        [facility]  [address] [id number] (retail)       [address]       [place], [place]  [address]       Ph: [phone]       Fax: [phone]   RxID:   [id number]   Appended Document: Rx Prob patient notified. patient states the pharmacist told her that Pravastatin would not mix with some of her other meds. she has had no problem with Crestor and her children are helping her pay for it.

## 2010-11-04 NOTE — Miscellaneous (Signed)
Summary: Returned fax to replace elixer  Clinical Lists Changes  Medications: Removed medication of LORTAB 7.5-500 MG/15ML ELIX (HYDROCODONE-ACETAMINOPHEN) Take one tab every four hours as needed for pain Added new medication of HYDROCODONE-ACETAMINOPHEN 7.5-500 MG TABS (HYDROCODONE-ACETAMINOPHEN) Take one tab every 4 hr prn - Signed Rx of HYDROCODONE-ACETAMINOPHEN 7.5-500 MG TABS (HYDROCODONE-ACETAMINOPHEN) Take one tab every 4 hr prn;  #100 x 1;  Signed;  Entered by: Candelaria Celeste MD;  Authorized by: Candelaria Celeste MD;  Method used: Printed then [facility] [address] [id number]*, [address] [place], [place], [place]  [address], Ph: [phone], Fax: [phone]    Prescriptions: HYDROCODONE-ACETAMINOPHEN 7.5-500 MG TABS (HYDROCODONE-ACETAMINOPHEN) Take one tab every 4 hr prn  #100 x 1   Entered and Authorized by:   Candelaria Celeste MD   Signed by:   Candelaria Celeste MD on 12/16/2009   Method used:   Printed then faxed to ...       [id number] ([facility]ail)       [address]       [place], [place]  [address]       Ph: [phone]       Fax: [phone]   RxID:   [id number]

## 2010-11-04 NOTE — Miscellaneous (Signed)
Summary: Hydrocodone refill via fax request  Clinical Lists Changes Received fax request for refill of hydrocodone.  Seems like a long term med for her.  I am unclear if this would be an early refill.  She received an RX for 100 on 3/14 with one refill.  I will give her the benefit of the doubt and send RX with zero refills.  I'll let Dr. Walker Kehr address longterm plan on his return. Medications: Rx of HYDROCODONE-ACETAMINOPHEN 7.5-500 MG TABS (HYDROCODONE-ACETAMINOPHEN) Take one tab every 4 hr prn;  #100 x 0;  Signed;  Entered by: Madison Hickman MD;  Authorized by: Madison Hickman MD;  Method used: Handwritten    Prescriptions: HYDROCODONE-ACETAMINOPHEN 7.5-500 MG TABS (HYDROCODONE-ACETAMINOPHEN) Take one tab every 4 hr prn  #100 x 0   Entered and Authorized by:   Madison Hickman MD   Signed by:   Madison Hickman MD on 02/06/2010   Method used:   Handwritten   RxID:   [id number]

## 2010-11-04 NOTE — Assessment & Plan Note (Signed)
Summary: FU/KH   Vital Signs:  Patient profile:   70 year old female Height:      63 inches Weight:      259.2 pounds BMI:     46.08 Pulse rate:   60 / minute BP sitting:   130 / 80  (right arm)  Vitals Entered By: Mauricia Area CMA, (November 12, 2009 1:41 PM) CC: f/up HTN Is Patient Diabetic? Yes Pain Assessment Patient in pain? no        Primary Care Provider:  Candelaria Celeste MD  CC:  f/up HTN.  History of Present Illness: right hip and knee pain the same.   Didn't switch to Crestor due to interaction of meds warned by pharmacist.Will bear the cost.  Accuchecks in low 100's, no hypoglycemia  Retired from [employer], plans to garden for exercise.   No chest pain or palpitations.    Habits & Providers  Alcohol-Tobacco-Diet     Tobacco Status: never  Current Medications (verified): 1)  Acetaminophen 500 Mg Tabs (Acetaminophen) .... As Needed 2)  Nitrostat 0.4 Mg Subl (Nitroglycerin) .... Place 1 Tablet Under Tongue As Directed 3)  Procardia Xl 90 Mg Tb24 (Nifedipine) .... Take One Tablet Daily 4)  Metoprolol Succinate 100 Mg Xr24h-Tab (Metoprolol Succinate) .... Take One Tablet Twice Daily 5)  Flonase 50 Mcg/act  Susp (Fluticasone Propionate) .... Two Sprays in Each Nostril Daily 6)  Zyrtec Allergy 10 Mg Tabs (Cetirizine Hcl) .... Take One Tablet Daily 7)  Lortab 7.5-500 Mg/48ml Elix (Hydrocodone-Acetaminophen) .... Take One Tab Every Four Hours As Needed For Pain 8)  Coumadin 5 Mg Tabs (Warfarin Sodium) .... Take As Directed 9)  Metformin Hcl 500 Mg Tabs (Metformin Hcl) .... Take One Tablet Two Times A Day 10)  Enalapril-Hydrochlorothiazide 10-25 Mg Tabs (Enalapril-Hydrochlorothiazide) .... Take One Tab Daily 11)  Crestor 20 Mg Tabs (Rosuvastatin Calcium) .Marland Kitchen.. 1 Tablet By Mouth Daily At Bedtime  Allergies (verified): 1)  Lisinopril (Lisinopril)  Social History: Lives in [place].  Retired, widowed, husband died of lung cancer Son, [name], disabled lives  with her Son, Traer, well She denies ETOH, drugs, or tobacco use.  Physical Exam  General:  alert, well-nourished, and a very overweight-appearing.   Lungs:  Normal respiratory effort, chest expands symmetrically. Lungs are clear to auscultation, no crackles or wheezes. Heart:  irregularly irregular rhythm at apical rate of 76.  S1 and S2 normal without gallop, murmur, click, rub or other extra sounds.  Psych:  normally interactive, good eye contact, and slightly anxious.     Impression & Recommendations:  Problem # 1:  DM (ICD-250.00) Due for A1c in 1 month.  Her updated medication list for this problem includes:    Metformin Hcl 500 Mg Tabs (Metformin hcl) .Marland Kitchen... Take one tablet two times a day    Enalapril-hydrochlorothiazide 10-25 Mg Tabs (Enalapril-hydrochlorothiazide) .Marland Kitchen... Take one tab daily  Orders: [place]- Est  Level 4 [id number])  Problem # 2:  SCIATICA (ICD-724.3) Assessment: Unchanged  Her updated medication list for this problem includes:    Acetaminophen 500 Mg Tabs (Acetaminophen) .Marland Kitchen... As needed    Lortab 7.5-500 Mg/35ml Elix (Hydrocodone-acetaminophen) .Marland Kitchen... Take one tab every four hours as needed for pain  Orders: [place]- Est  Level 4 (99214)  Problem # 3:  OBESITY, NOS (ICD-278.00) Assessment: Unchanged  Orders: [place]- Est  Level 4 [id number])  Problem # 4:  ATRIAL FIBRILLATION (ICD-427.31) Sounds like she is in sinus rhythm currently Her updated medication list for this problem includes:  Procardia Xl 90 Mg Tb24 (Nifedipine) .Marland Kitchen... Take one tablet daily    Metoprolol Succinate 100 Mg Xr24h-tab (Metoprolol succinate) .Marland Kitchen... Take one tablet twice daily    Coumadin 5 Mg Tabs (Warfarin sodium) .Marland Kitchen... Take as directed  Complete Medication List: 1)  Acetaminophen 500 Mg Tabs (Acetaminophen) .... As needed 2)  Nitrostat 0.4 Mg Subl (Nitroglycerin) .... Place 1 tablet under tongue as directed 3)  Procardia Xl 90 Mg Tb24 (Nifedipine) .... Take one tablet daily 4)   Metoprolol Succinate 100 Mg Xr24h-tab (Metoprolol succinate) .... Take one tablet twice daily 5)  Flonase 50 Mcg/act Susp (Fluticasone propionate) .... Two sprays in each nostril daily 6)  Zyrtec Allergy 10 Mg Tabs (Cetirizine hcl) .... Take one tablet daily 7)  Lortab 7.5-500 Mg/17ml Elix (Hydrocodone-acetaminophen) .... Take one tab every four hours as needed for pain 8)  Coumadin 5 Mg Tabs (Warfarin sodium) .... Take as directed 9)  Metformin Hcl 500 Mg Tabs (Metformin hcl) .... Take one tablet two times a day 10)  Enalapril-hydrochlorothiazide 10-25 Mg Tabs (Enalapril-hydrochlorothiazide) .... Take one tab daily 11)  Crestor 20 Mg Tabs (Rosuvastatin calcium) .Marland Kitchen.. 1 tablet by mouth daily at bedtime  Other Orders: Hemoccult Cards (Take Home) (Hemoccult Cards)  Patient Instructions: 1)  Please schedule a follow-up appointment in 2 months.  2)  Walk 3 days weekly x 20 minutes plus work in the garden.  3)  Mammogram will be scheduled by her.  4)  Zostavax info given.  5)  Handicap tag form signed. But encouraged to walk   Prevention & Chronic Care Immunizations   Influenza vaccine: Historical  (07/14/2009)   Influenza vaccine due: 06/27/2009    Tetanus booster: 01/04/2004: Done.   Tetanus booster due: 01/03/2014    Pneumococcal vaccine: Done.  (11/05/1998)   Pneumococcal vaccine deferral: Not indicated  (05/14/2009)   Pneumococcal vaccine due: None    H. zoster vaccine: Not documented  Colorectal Screening   Hemoccult: Done.  (05/06/1995)   Hemoccult action/deferral: Ordered  (11/12/2009)   Hemoccult due: 05/05/1996    Colonoscopy: Not documented  Other Screening   Pap smear: Done.  (04/04/2004)   Pap smear due: 04/04/2005    Mammogram: Done.  (05/05/2002)   Mammogram due: 05/06/2003    DXA bone density scan: Not documented   Smoking status: never  (11/12/2009)  Diabetes Mellitus   HgbA1C: 6.5  (09/03/2009)    Eye exam: normal  (10/05/2008)   Eye exam due:  10/2009    Foot exam: yes  (09/03/2009)   High risk foot: Not documented   Foot care education: Not documented    Urine microalbumin/creatinine ratio: Not documented    Diabetes flowsheet reviewed?: Yes   Progress toward A1C goal: At goal  Lipids   Total Cholesterol: 128  (10/29/2009)   LDL: 30  (10/29/2009)   LDL Direct: 59  (09/03/2009)   HDL: 38  (10/29/2009)   Triglycerides: 300  (10/29/2009)    SGOT (AST): 12  (09/03/2009)   SGPT (ALT): 12  (09/03/2009)   Alkaline phosphatase: 54  (09/03/2009)   Total bilirubin: 0.6  (09/03/2009)    Lipid flowsheet reviewed?: Yes   Progress toward LDL goal: At goal  Hypertension   Last Blood Pressure: 130 / 80  (11/12/2009)   Serum creatinine: 1.02  (09/03/2009)   Serum potassium 4.2  (09/03/2009)    Hypertension flowsheet reviewed?: Yes   Progress toward BP goal: At goal  Self-Management Support :   Personal Goals (by the next clinic  visit) :     Personal A1C goal: 7  (09/03/2009)     Personal blood pressure goal: 130/80  (09/03/2009)     Personal LDL goal: 100  (09/03/2009)    Patient will work on the following items until the next clinic visit to reach self-care goals:     Medications and monitoring: check my blood pressure, bring all of my medications to every visit  (09/03/2009)     Activity: join a walking program  (09/03/2009)    Diabetes self-management support: Not documented    Hypertension self-management support: Not documented    Lipid self-management support: Not documented    Nursing Instructions: Provide Hemoccult cards with instructions (see order)     Prevention & Chronic Care Immunizations   Influenza vaccine: Historical  (07/14/2009)   Influenza vaccine due: 06/27/2009    Tetanus booster: 01/04/2004: Done.   Tetanus booster due: 01/03/2014    Pneumococcal vaccine: Done.  (11/05/1998)   Pneumococcal vaccine deferral: Not indicated  (05/14/2009)   Pneumococcal vaccine due: None    H. zoster  vaccine: Not documented  Colorectal Screening   Hemoccult: Done.  (05/06/1995)   Hemoccult action/deferral: Ordered  (11/12/2009)   Hemoccult due: 05/05/1996    Colonoscopy: Not documented  Other Screening   Pap smear: Done.  (04/04/2004)   Pap smear due: 04/04/2005    Mammogram: Done.  (05/05/2002)   Mammogram due: 05/06/2003    DXA bone density scan: Not documented   Smoking status: never  (11/12/2009)  Diabetes Mellitus   HgbA1C: 6.5  (09/03/2009)    Eye exam: normal  (10/05/2008)   Eye exam due: 10/2009    Foot exam: yes  (09/03/2009)   High risk foot: Not documented   Foot care education: Not documented    Urine microalbumin/creatinine ratio: Not documented    Diabetes flowsheet reviewed?: Yes   Progress toward A1C goal: At goal  Lipids   Total Cholesterol: 128  (10/29/2009)   LDL: 30  (10/29/2009)   LDL Direct: 59  (09/03/2009)   HDL: 38  (10/29/2009)   Triglycerides: 300  (10/29/2009)    SGOT (AST): 12  (09/03/2009)   SGPT (ALT): 12  (09/03/2009)   Alkaline phosphatase: 54  (09/03/2009)   Total bilirubin: 0.6  (09/03/2009)    Lipid flowsheet reviewed?: Yes   Progress toward LDL goal: At goal  Hypertension   Last Blood Pressure: 130 / 80  (11/12/2009)   Serum creatinine: 1.02  (09/03/2009)   Serum potassium 4.2  (09/03/2009)    Hypertension flowsheet reviewed?: Yes   Progress toward BP goal: At goal  Self-Management Support :   Personal Goals (by the next clinic visit) :     Personal A1C goal: 7  (09/03/2009)     Personal blood pressure goal: 130/80  (09/03/2009)     Personal LDL goal: 100  (09/03/2009)    Diabetes self-management support: Not documented    Hypertension self-management support: Not documented    Lipid self-management support: Not documented

## 2010-11-04 NOTE — Miscellaneous (Signed)
Summary: [place] Controlled Substance Contract  [facility] Controlled Substance Contract   Imported By: Raymond Gurney 03/06/2010 13:42:58  _____________________________________________________________________  External Attachment:    Type:   Image     Comment:   External Document  Appended Document: [facility] Controlled Substance Contract    Clinical Lists Changes  Directives: Added new directive of PAIN CONTRACT IN DOCUMENTS

## 2010-11-06 NOTE — Assessment & Plan Note (Signed)
    Prevention & Chronic Care Immunizations   Influenza vaccine: Historical  (07/14/2009)   Influenza vaccine due: 06/27/2009    Tetanus booster: 01/04/2004: Done.   Tetanus booster due: 01/03/2014    Pneumococcal vaccine: Pneumovax (Medicare)  (03/04/2010)   Pneumococcal vaccine deferral: Not indicated  (05/14/2009)   Pneumococcal vaccine due: None    H. zoster vaccine: Not documented  Colorectal Screening   Hemoccult: Done.  (05/06/1995)   Hemoccult action/deferral: Ordered  (11/12/2009)   Hemoccult due: 05/05/1996    Colonoscopy: Not documented   Colonoscopy action/deferral: Deferred  (03/04/2010)  Other Screening   Pap smear: Done.  (04/04/2004)   Pap smear due: 04/04/2005    Mammogram: Done.  (05/05/2002)   Mammogram due: 05/06/2003    DXA bone density scan: Not documented   Smoking status: never  (05/20/2010)  Diabetes Mellitus   HgbA1C: 6.1  (05/20/2010)    Eye exam: No diabetic retinopathy.     (01/03/2010)   Eye exam due: 10/2009    Foot exam: yes  (09/03/2009)   High risk foot: Not documented   Foot care education: Not documented    Urine microalbumin/creatinine ratio: Not documented  Lipids   Total Cholesterol: 128  (10/29/2009)   LDL: 30  (10/29/2009)   LDL Direct: 59  (09/03/2009)   HDL: 38  (10/29/2009)   Triglycerides: 300  (10/29/2009)    SGOT (AST): 12  (09/03/2009)   SGPT (ALT): 12  (09/03/2009)   Alkaline phosphatase: 54  (09/03/2009)   Total bilirubin: 0.6  (09/03/2009)  Hypertension   Last Blood Pressure: 130 / 88  (05/20/2010)   Serum creatinine: 1.07  (03/04/2010)   Serum potassium 4.3  (03/04/2010)  Self-Management Support :   Personal Goals (by the next clinic visit) :     Personal A1C goal: 7  (09/03/2009)     Personal blood pressure goal: 130/80  (09/03/2009)     Personal LDL goal: 100  (09/03/2009)    Diabetes self-management support: Not documented    Hypertension self-management support: Not documented    Lipid  self-management support: Not documented
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
## 2010-11-11 ENCOUNTER — Encounter: Payer: Self-pay | Admitting: Family Medicine

## 2010-11-11 ENCOUNTER — Other Ambulatory Visit (INDEPENDENT_AMBULATORY_CARE_PROVIDER_SITE_OTHER): Payer: MEDICARE

## 2010-11-11 DIAGNOSIS — I4891 Unspecified atrial fibrillation: Secondary | ICD-10-CM

## 2010-11-11 DIAGNOSIS — Z7901 Long term (current) use of anticoagulants: Secondary | ICD-10-CM

## 2010-11-11 LAB — CONVERTED CEMR LAB
ALT: 14 units/L (ref 0–35)
AST: 14 units/L (ref 0–37)
Alkaline Phosphatase: 51 units/L (ref 39–117)
Cholesterol: 133 mg/dL (ref 0–200)
INR: 2.3
MCHC: 32.9 g/dL (ref 30.0–36.0)
MCV: 94.7 fL (ref 78.0–100.0)
Platelets: 321 10*3/uL (ref 150–400)
Sodium: 139 meq/L (ref 135–145)
Total Bilirubin: 0.5 mg/dL (ref 0.3–1.2)
Total Protein: 6.6 g/dL (ref 6.0–8.3)
VLDL: 59 mg/dL — ABNORMAL HIGH (ref 0–40)

## 2010-11-12 ENCOUNTER — Encounter: Payer: Self-pay | Admitting: Family Medicine

## 2010-11-15 ENCOUNTER — Encounter: Payer: Self-pay | Admitting: Family Medicine

## 2010-11-15 DIAGNOSIS — Z7901 Long term (current) use of anticoagulants: Secondary | ICD-10-CM | POA: Insufficient documentation

## 2010-11-15 DIAGNOSIS — I4891 Unspecified atrial fibrillation: Secondary | ICD-10-CM

## 2010-11-20 NOTE — Letter (Signed)
Summary: Results Follow-up Letter  [facility]  [address]   [place], [place] [address]   Phone: [phone]  Fax: [phone]    11/12/2010  [place], [place]  [address]  [place]  Dear Ms. Law,   The following are the results of your recent test(s): Patient: Abigail Duncan Your body salts are normal  Tests: (1) CMP with Estimated GFR (2402)   Order Note: FASTING   Sodium                    139 mEq/L                   135-145   Potassium                 4.3 mEq/L                   3.5-5.3   Chloride                  103 mEq/L                   96-112   CO2                       22 mEq/L                    19-32   Glucose              [H]  130 mg/dL                   70-99   BUN                  [H]  27 mg/dL                    6-23   Creatinine                1.12 mg/dL                  0.40-1.20   Bilirubin, Total          0.5 mg/dL                   0.3-1.2   Alkaline Phosphatase      51 U/L                      39-117   AST/SGOT                  14 U/L                      0-37   ALT/SGPT                  14 U/L                      0-35   Total Protein             6.6 g/dL                    6.0-8.3   Albumin                   4.2 g/dL  3.5-5.2   Calcium                   8.9 mg/dL                   8.4-10.5 ! Est GFR, African American                        [L]  59 mL/min                   >60 You have a mild decrease in your kidney function  Tests: (2) CBC NO Diff (Complete Blood Count) (10000)   WBC                       9.6 K/uL                    4.0-10.5   RBC                       4.52 MIL/uL                 3.87-5.11   Hemoglobin                14.1 g/dL                   12.0-15.0   Hematocrit                42.8 %                      36.0-46.0   MCV                       94.7 fL                     78.0-100.0 ! MCH                       31.2 pg                     26.0-34.0   MCHC                       32.9 g/dL                   30.0-36.0   RDW                       14.3 %                      11.5-15.5   Platelet Count            321 K/uL                    150-400  Tests: (3) Lipid Profile [id number])   Cholesterol               133 mg/dL                   0-200         Triglyceride         [H]  293 mg/dL                   <150   HDL Cholesterol      [  L]  36 mg/dL                    >39   Total Chol/HDL Ratio      3.7 Ratio  VLDL Cholesterol (Calc)                        [H]  59 mg/dL                    0-40  LDL Cholesterol (Calc)                             38 mg/dL                    0-99 Your cholesterol is low enough that it should prevent further build-up of plaque in your arteries. Document Creation Date: 11/12/2010 10:27 AM _______________________________________________________________________  Sincerely,  Candelaria Celeste MD [facility]

## 2010-11-20 NOTE — Letter (Signed)
Summary: Results Follow-up Letter  [facility]  [address]   [place], [place] [address]   Phone: [phone]  Fax: [phone]    11/12/2010  [place], [place]  [address]  [place]  Dear Ms. Winrow,   The following are the results of your recent test(s):  Test     Result     Pap Smear    Normal_______  Not Normal_____       Comments: _________________________________________________________ Cholesterol LDL(Bad cholesterol):          Your goal is less than:         HDL (Good cholesterol):        Your goal is more than: _________________________________________________________ Other Tests:   _________________________________________________________  Please call for an appointment Or _________________________________________________________ _________________________________________________________ _________________________________________________________  Sincerely,  Candelaria Celeste MD [place]

## 2010-12-08 ENCOUNTER — Ambulatory Visit (INDEPENDENT_AMBULATORY_CARE_PROVIDER_SITE_OTHER): Payer: MEDICARE | Admitting: *Deleted

## 2010-12-08 DIAGNOSIS — Z7901 Long term (current) use of anticoagulants: Secondary | ICD-10-CM

## 2010-12-08 DIAGNOSIS — I4891 Unspecified atrial fibrillation: Secondary | ICD-10-CM

## 2010-12-08 LAB — PROTIME-INR: INR: 2.4 — AB (ref 0.9–1.1)

## 2010-12-15 ENCOUNTER — Ambulatory Visit: Payer: MEDICARE

## 2010-12-15 ENCOUNTER — Encounter: Payer: Self-pay | Admitting: Home Health Services

## 2011-01-05 ENCOUNTER — Ambulatory Visit (INDEPENDENT_AMBULATORY_CARE_PROVIDER_SITE_OTHER): Payer: MEDICARE | Admitting: *Deleted

## 2011-01-05 DIAGNOSIS — I4891 Unspecified atrial fibrillation: Secondary | ICD-10-CM

## 2011-01-05 DIAGNOSIS — Z7901 Long term (current) use of anticoagulants: Secondary | ICD-10-CM

## 2011-01-05 LAB — POCT INR: INR: 3.2

## 2011-01-13 LAB — GLUCOSE, CAPILLARY: Glucose-Capillary: 199 mg/dL — ABNORMAL HIGH (ref 70–99)

## 2011-01-20 ENCOUNTER — Ambulatory Visit: Payer: MEDICARE | Admitting: Family Medicine

## 2011-01-20 ENCOUNTER — Ambulatory Visit: Payer: MEDICARE

## 2011-01-23 ENCOUNTER — Ambulatory Visit (INDEPENDENT_AMBULATORY_CARE_PROVIDER_SITE_OTHER): Payer: MEDICARE | Admitting: *Deleted

## 2011-01-23 DIAGNOSIS — I4891 Unspecified atrial fibrillation: Secondary | ICD-10-CM

## 2011-01-23 DIAGNOSIS — Z7901 Long term (current) use of anticoagulants: Secondary | ICD-10-CM

## 2011-01-29 ENCOUNTER — Other Ambulatory Visit: Payer: Self-pay | Admitting: Family Medicine

## 2011-01-29 MED ORDER — HYDROCODONE-ACETAMINOPHEN 7.5-500 MG PO TABS: 1.0000 | ORAL_TABLET | ORAL | Status: DC | PRN

## 2011-01-29 NOTE — Telephone Encounter (Signed)
Refilled faxed on their form with 0 refills. She has a visit next week.

## 2011-02-03 ENCOUNTER — Ambulatory Visit (INDEPENDENT_AMBULATORY_CARE_PROVIDER_SITE_OTHER): Payer: MEDICARE | Admitting: Family Medicine

## 2011-02-03 ENCOUNTER — Encounter: Payer: Self-pay | Admitting: Family Medicine

## 2011-02-03 VITALS — BP 156/83 | HR 68 | Temp 98.1°F | Ht 63.0 in | Wt 268.0 lb

## 2011-02-03 DIAGNOSIS — I10 Essential (primary) hypertension: Secondary | ICD-10-CM

## 2011-02-03 DIAGNOSIS — E119 Type 2 diabetes mellitus without complications: Secondary | ICD-10-CM

## 2011-02-03 DIAGNOSIS — M5430 Sciatica, unspecified side: Secondary | ICD-10-CM

## 2011-02-03 DIAGNOSIS — M1710 Unilateral primary osteoarthritis, unspecified knee: Secondary | ICD-10-CM

## 2011-02-03 DIAGNOSIS — E785 Hyperlipidemia, unspecified: Secondary | ICD-10-CM

## 2011-02-03 DIAGNOSIS — E66813 Obesity, class 3: Secondary | ICD-10-CM

## 2011-02-03 DIAGNOSIS — IMO0002 Reserved for concepts with insufficient information to code with codable children: Secondary | ICD-10-CM

## 2011-02-03 LAB — POCT UA - MICROALBUMIN: Albumin/Creatinine Ratio, Urine, POC: <30

## 2011-02-03 MED ORDER — METFORMIN HCL 500 MG PO TABS: 500.0000 mg | ORAL_TABLET | Freq: Two times a day (BID) | ORAL | Status: DC

## 2011-02-03 MED ORDER — HYDROCODONE-ACETAMINOPHEN 7.5-500 MG PO TABS: 1.0000 | ORAL_TABLET | ORAL | Status: DC | PRN

## 2011-02-03 NOTE — Progress Notes (Signed)
  Subjective:    Patient ID: LAQUIDA STROUP, female    DOB: Apr 01, 1941, 70 y.o.   MRN: [id number]  HPI Mrs. Escobar is here for a followup visit for diabetes and hypertension. She continues her back and knee pain which are worse on the right side.  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 Her back pain continues to be controlled with the hydrocodone.  She has scheduled a mammogram soon and after that plans to get colonoscopy  She has not had chest pain or used any nitroglycerin.  [facility]80-83    Review of Systems  Constitutional: Positive for appetite change. Negative for activity change and unexpected weight change.  Respiratory: Negative for cough and shortness of breath.   Cardiovascular: Negative for chest pain and leg swelling.  Neurological: Positive for tremors.       Objective:   Physical Exam  Constitutional:       Generalized morbid obesity Comfortable  Cardiovascular: Normal rate and regular rhythm.   Pulmonary/Chest: Effort normal and breath sounds normal.  Musculoskeletal: She exhibits edema.       Trace edema Decreased rotation right hip Limited range of motion low back Lumbar lordosis compensatory for abdominal obesty  Neurological: She exhibits normal muscle tone.       Fine postural tremor that she reports having for years.          Assessment & Plan:

## 2011-02-03 NOTE — Patient Instructions (Signed)
Recheck in 3 months. Sooner if your blood pressure is running above 130.

## 2011-02-06 ENCOUNTER — Encounter: Payer: Self-pay | Admitting: Family Medicine

## 2011-02-06 MED ORDER — OMEGA-3 FATTY ACIDS 1000 MG PO CAPS: 2.0000 g | ORAL_CAPSULE | Freq: Every day | ORAL | Status: DC

## 2011-02-06 NOTE — Assessment & Plan Note (Signed)
Fair control. Definite white coal elevation.

## 2011-02-06 NOTE — Assessment & Plan Note (Signed)
LDL well controlled. Triglycerides high. Increase fish oil to 2 caps daily

## 2011-02-06 NOTE — Assessment & Plan Note (Signed)
No success at weight loss. I reminded her that reducing weight is best treatment for her arthritic pains.

## 2011-02-06 NOTE — Assessment & Plan Note (Signed)
Continues low back pain

## 2011-02-12 ENCOUNTER — Ambulatory Visit: Payer: MEDICARE | Admitting: Home Health Services

## 2011-02-20 ENCOUNTER — Other Ambulatory Visit: Payer: Self-pay | Admitting: Family Medicine

## 2011-02-20 ENCOUNTER — Ambulatory Visit (INDEPENDENT_AMBULATORY_CARE_PROVIDER_SITE_OTHER): Payer: Medicare Other | Admitting: *Deleted

## 2011-02-20 DIAGNOSIS — I4891 Unspecified atrial fibrillation: Secondary | ICD-10-CM

## 2011-02-20 DIAGNOSIS — Z7901 Long term (current) use of anticoagulants: Secondary | ICD-10-CM

## 2011-02-20 LAB — POCT INR: INR: 2.2

## 2011-02-20 NOTE — Telephone Encounter (Signed)
Refill request

## 2011-02-25 ENCOUNTER — Telehealth: Payer: Self-pay | Admitting: Family Medicine

## 2011-02-25 NOTE — Telephone Encounter (Signed)
Ms. Sultani calling regarding the last rx she got for her hydrocodone.  She is use to having the rx filled for 100 tabs, but only received rx for 30 this last refill.  Want to know why the reduction in quantity.

## 2011-02-26 ENCOUNTER — Telehealth: Payer: Self-pay | Admitting: *Deleted
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
## 2011-02-27 ENCOUNTER — Other Ambulatory Visit: Payer: Self-pay | Admitting: Family Medicine

## 2011-02-27 MED ORDER — HYDROCODONE-ACETAMINOPHEN 7.5-500 MG PO TABS: 1.0000 | ORAL_TABLET | ORAL | Status: DC | PRN

## 2011-02-27 NOTE — Telephone Encounter (Signed)
I called the corrected prescription to the pharmacy and left a message on her machine that I had done so.

## 2011-03-13 ENCOUNTER — Other Ambulatory Visit: Payer: Self-pay | Admitting: Family Medicine

## 2011-03-13 NOTE — Telephone Encounter (Signed)
Refill request

## 2011-03-24 ENCOUNTER — Ambulatory Visit (INDEPENDENT_AMBULATORY_CARE_PROVIDER_SITE_OTHER): Payer: Medicare Other | Admitting: *Deleted

## 2011-03-24 DIAGNOSIS — I4891 Unspecified atrial fibrillation: Secondary | ICD-10-CM

## 2011-03-24 DIAGNOSIS — Z7901 Long term (current) use of anticoagulants: Secondary | ICD-10-CM

## 2011-03-29 ENCOUNTER — Other Ambulatory Visit: Payer: Self-pay | Admitting: Family Medicine

## 2011-03-29 NOTE — Telephone Encounter (Signed)
Refill request

## 2011-04-21 ENCOUNTER — Ambulatory Visit (INDEPENDENT_AMBULATORY_CARE_PROVIDER_SITE_OTHER): Payer: Medicare Other | Admitting: *Deleted

## 2011-04-21 DIAGNOSIS — Z7901 Long term (current) use of anticoagulants: Secondary | ICD-10-CM

## 2011-04-21 DIAGNOSIS — I4891 Unspecified atrial fibrillation: Secondary | ICD-10-CM

## 2011-05-17 ENCOUNTER — Other Ambulatory Visit: Payer: Self-pay | Admitting: Family Medicine

## 2011-05-17 DIAGNOSIS — I10 Essential (primary) hypertension: Secondary | ICD-10-CM

## 2011-05-17 NOTE — Telephone Encounter (Signed)
Refill request

## 2011-05-19 ENCOUNTER — Ambulatory Visit (INDEPENDENT_AMBULATORY_CARE_PROVIDER_SITE_OTHER): Payer: Medicare Other | Admitting: *Deleted

## 2011-05-19 DIAGNOSIS — Z7901 Long term (current) use of anticoagulants: Secondary | ICD-10-CM

## 2011-05-19 DIAGNOSIS — I4891 Unspecified atrial fibrillation: Secondary | ICD-10-CM

## 2011-05-19 LAB — POCT INR: INR: 2.6

## 2011-06-09 ENCOUNTER — Ambulatory Visit: Payer: Medicare Other | Admitting: Family Medicine

## 2011-06-16 ENCOUNTER — Ambulatory Visit (INDEPENDENT_AMBULATORY_CARE_PROVIDER_SITE_OTHER): Payer: Medicare Other | Admitting: Family Medicine

## 2011-06-16 ENCOUNTER — Ambulatory Visit: Payer: Medicare Other

## 2011-06-16 ENCOUNTER — Ambulatory Visit (INDEPENDENT_AMBULATORY_CARE_PROVIDER_SITE_OTHER): Payer: Medicare Other | Admitting: *Deleted

## 2011-06-16 ENCOUNTER — Encounter: Payer: Self-pay | Admitting: Family Medicine

## 2011-06-16 VITALS — BP 150/80 | HR 89 | Ht 62.0 in | Wt 261.0 lb

## 2011-06-16 DIAGNOSIS — I998 Other disorder of circulatory system: Secondary | ICD-10-CM | POA: Insufficient documentation

## 2011-06-16 DIAGNOSIS — I4891 Unspecified atrial fibrillation: Secondary | ICD-10-CM

## 2011-06-16 DIAGNOSIS — M5430 Sciatica, unspecified side: Secondary | ICD-10-CM

## 2011-06-16 DIAGNOSIS — I10 Essential (primary) hypertension: Secondary | ICD-10-CM

## 2011-06-16 DIAGNOSIS — I999 Unspecified disorder of circulatory system: Secondary | ICD-10-CM

## 2011-06-16 DIAGNOSIS — Z7901 Long term (current) use of anticoagulants: Secondary | ICD-10-CM

## 2011-06-16 DIAGNOSIS — E119 Type 2 diabetes mellitus without complications: Secondary | ICD-10-CM

## 2011-06-16 DIAGNOSIS — I2510 Atherosclerotic heart disease of native coronary artery without angina pectoris: Secondary | ICD-10-CM

## 2011-06-16 LAB — POCT URINALYSIS DIPSTICK
Blood, UA: NEGATIVE
Glucose, UA: NEGATIVE
Nitrite, UA: NEGATIVE
Urobilinogen, UA: 1
pH, UA: 5.5

## 2011-06-16 LAB — POCT GLYCOSYLATED HEMOGLOBIN (HGB A1C): Hemoglobin A1C: 6.1

## 2011-06-16 MED ORDER — NIFEDIPINE ER OSMOTIC RELEASE 90 MG PO TB24: 90.0000 mg | ORAL_TABLET | Freq: Every day | ORAL | Status: DC

## 2011-06-16 MED ORDER — HYDROCODONE-ACETAMINOPHEN 7.5-500 MG PO TABS: 1.0000 | ORAL_TABLET | Freq: Four times a day (QID) | ORAL | Status: DC | PRN

## 2011-06-16 NOTE — Assessment & Plan Note (Signed)
Rate controlled 

## 2011-06-16 NOTE — Progress Notes (Signed)
  Subjective:    Patient ID: Abigail Duncan, female    DOB: [birth year], 70 y.o.   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 She does not have trouble with her back as long she takes the hydrocodone. It does bother her as in the right low back and in the right by Dr. does not go down the leg. She does have a great toes feel cold but she's not get any calf pain with walking. 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    Review of Systems     Objective:   Physical Exam  Constitutional:       Morbidly obese  Cardiovascular: Normal rate.   No murmur heard.      Irregularly irregular rhythm  Dorsalis pedis pulses were one + Posterior tibial pulses not palpable, but ankles obese   Pulmonary/Chest: Effort normal and breath sounds normal.  Musculoskeletal: She exhibits no edema.  Neurological: She is alert.  Psychiatric: She has a normal mood and affect. Her behavior is normal. Judgment and thought content normal.          Assessment & Plan:

## 2011-06-16 NOTE — Assessment & Plan Note (Signed)
In therapeutic range. Recheck in one month

## 2011-06-16 NOTE — Assessment & Plan Note (Signed)
Cold toes could be ASPVD. Will order Arterial dopplers.

## 2011-06-16 NOTE — Patient Instructions (Signed)
I will call if any lab abnormality or need to change medication doses.   Recheck in 3 months.

## 2011-06-16 NOTE — Assessment & Plan Note (Addendum)
No chest pain

## 2011-06-16 NOTE — Assessment & Plan Note (Signed)
well controlled Continue medications the same

## 2011-06-17 LAB — BASIC METABOLIC PANEL
CO2: 23 mEq/L (ref 19–32)
Chloride: 102 mEq/L (ref 96–112)
Creat: 1.1 mg/dL (ref 0.50–1.10)

## 2011-06-17 LAB — DRUG SCREEN, URINE
Creatinine,U: 241.4 mg/dL
Methadone: NEGATIVE
Opiates: POSITIVE — AB
Phencyclidine (PCP): NEGATIVE
Propoxyphene: NEGATIVE

## 2011-07-14 ENCOUNTER — Ambulatory Visit (INDEPENDENT_AMBULATORY_CARE_PROVIDER_SITE_OTHER): Payer: Medicare Other | Admitting: *Deleted

## 2011-07-14 DIAGNOSIS — I4891 Unspecified atrial fibrillation: Secondary | ICD-10-CM

## 2011-07-14 DIAGNOSIS — Z7901 Long term (current) use of anticoagulants: Secondary | ICD-10-CM

## 2011-07-18 ENCOUNTER — Other Ambulatory Visit: Payer: Self-pay | Admitting: Family Medicine

## 2011-07-18 DIAGNOSIS — I10 Essential (primary) hypertension: Secondary | ICD-10-CM

## 2011-07-19 NOTE — Telephone Encounter (Signed)
Refill request

## 2011-07-20 ENCOUNTER — Telehealth: Payer: Self-pay | Admitting: *Deleted

## 2011-07-20 DIAGNOSIS — M5430 Sciatica, unspecified side: Secondary | ICD-10-CM

## 2011-07-20 MED ORDER — HYDROCODONE-ACETAMINOPHEN 7.5-500 MG PO TABS: 1.0000 | ORAL_TABLET | Freq: Four times a day (QID) | ORAL | Status: DC | PRN

## 2011-07-20 NOTE — Telephone Encounter (Signed)
I called Abigail Duncan who confirmed that [facility], so I called and left a message for that Rx #100 with one refill. She denies taking any benzodiazepines though the UDS was positive for that.

## 2011-07-20 NOTE — Telephone Encounter (Addendum)
Call received from pharmacy . Patient needs refill on lortabs. Last rx was 06/16/2011. Was filled on 06/22/2011. Pharmacy can not fax request any longer and Rx cannot be faxed to them. Has to be called in or either patient picks up RX to take to pharmacy. Will send message to MD.  [facility] [place].

## 2011-08-11 ENCOUNTER — Ambulatory Visit (INDEPENDENT_AMBULATORY_CARE_PROVIDER_SITE_OTHER): Payer: Medicare Other | Admitting: *Deleted

## 2011-08-11 DIAGNOSIS — Z7901 Long term (current) use of anticoagulants: Secondary | ICD-10-CM

## 2011-08-11 DIAGNOSIS — I4891 Unspecified atrial fibrillation: Secondary | ICD-10-CM

## 2011-08-11 LAB — POCT INR: INR: 2.5

## 2011-09-08 ENCOUNTER — Ambulatory Visit: Payer: Medicare Other | Admitting: Family Medicine

## 2011-09-08 ENCOUNTER — Ambulatory Visit (INDEPENDENT_AMBULATORY_CARE_PROVIDER_SITE_OTHER): Payer: Medicare Other | Admitting: *Deleted

## 2011-09-08 DIAGNOSIS — Z7901 Long term (current) use of anticoagulants: Secondary | ICD-10-CM

## 2011-09-08 DIAGNOSIS — I4891 Unspecified atrial fibrillation: Secondary | ICD-10-CM

## 2011-09-08 LAB — POCT INR: INR: 1.9

## 2011-09-15 ENCOUNTER — Ambulatory Visit (INDEPENDENT_AMBULATORY_CARE_PROVIDER_SITE_OTHER): Payer: Medicare Other | Admitting: Family Medicine

## 2011-09-15 ENCOUNTER — Encounter: Payer: Self-pay | Admitting: Family Medicine

## 2011-09-15 VITALS — BP 134/80 | HR 82 | Ht 62.0 in | Wt 263.0 lb

## 2011-09-15 DIAGNOSIS — M5430 Sciatica, unspecified side: Secondary | ICD-10-CM

## 2011-09-15 DIAGNOSIS — E785 Hyperlipidemia, unspecified: Secondary | ICD-10-CM

## 2011-09-15 DIAGNOSIS — I2510 Atherosclerotic heart disease of native coronary artery without angina pectoris: Secondary | ICD-10-CM

## 2011-09-15 DIAGNOSIS — E119 Type 2 diabetes mellitus without complications: Secondary | ICD-10-CM

## 2011-09-15 MED ORDER — METOPROLOL TARTRATE 100 MG PO TABS: 100.0000 mg | ORAL_TABLET | Freq: Two times a day (BID) | ORAL | Status: DC

## 2011-09-15 MED ORDER — NITROGLYCERIN 0.4 MG SL SUBL: 0.4000 mg | SUBLINGUAL_TABLET | SUBLINGUAL | Status: DC

## 2011-09-15 MED ORDER — HYDROCODONE-ACETAMINOPHEN 7.5-500 MG PO TABS: 1.0000 | ORAL_TABLET | Freq: Four times a day (QID) | ORAL | Status: DC | PRN

## 2011-09-15 NOTE — Patient Instructions (Signed)
Return end of Feb for fasting lab tests Return to see Dr Walker Kehr in 3 months  Signing up with [employer] is a good idea.   Schedule your mammogram.

## 2011-09-15 NOTE — Assessment & Plan Note (Signed)
Remains functional on her current narcotic dose

## 2011-09-15 NOTE — Progress Notes (Signed)
  Subjective:    Patient ID: Abigail Duncan, female    DOB: 1941/01/21, 70 y.o.   MRN: [id number]  HPI from back in right sciatica pain continue about the same. She manages it with 3 times a day Vicodin.  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  Hasn't had chest pain. She hasn't used it but needs her nitroglycerin refilled.  She promises to schedule her mammogram.  She hasn't been able lose weight and she has pain in her knees right greater than left.    Review of Systems     Objective:   Physical Exam  Constitutional: She is oriented to person, place, and time.       Morbidly obese  Cardiovascular: Normal rate, regular rhythm and normal heart sounds.   No murmur heard. Pulmonary/Chest: Effort normal and breath sounds normal.  Musculoskeletal: She exhibits edema.       No effusions, but DJD type enlargement  Trace ankle edema  Lordotic due to obesity. No pain with range of motion of back, but limited   Neurological: She is alert and oriented to person, place, and time.  Psychiatric: She has a normal mood and affect. Her behavior is normal. Thought content normal.          Assessment & Plan:

## 2011-09-15 NOTE — Assessment & Plan Note (Signed)
No improvement

## 2011-09-15 NOTE — Assessment & Plan Note (Signed)
well controlled  

## 2011-09-15 NOTE — Assessment & Plan Note (Signed)
Asymptomatic. Will continue to have ntg on hand

## 2011-09-28 ENCOUNTER — Other Ambulatory Visit: Payer: Self-pay | Admitting: Family Medicine

## 2011-09-28 DIAGNOSIS — I10 Essential (primary) hypertension: Secondary | ICD-10-CM

## 2011-09-30 NOTE — Telephone Encounter (Signed)
Refill request

## 2011-10-13 ENCOUNTER — Ambulatory Visit (INDEPENDENT_AMBULATORY_CARE_PROVIDER_SITE_OTHER): Payer: Medicare Other | Admitting: *Deleted

## 2011-10-13 DIAGNOSIS — I4891 Unspecified atrial fibrillation: Secondary | ICD-10-CM

## 2011-10-13 DIAGNOSIS — Z7901 Long term (current) use of anticoagulants: Secondary | ICD-10-CM

## 2011-10-13 LAB — POCT INR: INR: 2.2

## 2011-11-10 ENCOUNTER — Ambulatory Visit (INDEPENDENT_AMBULATORY_CARE_PROVIDER_SITE_OTHER): Payer: Medicare Other | Admitting: *Deleted

## 2011-11-10 DIAGNOSIS — Z7901 Long term (current) use of anticoagulants: Secondary | ICD-10-CM

## 2011-11-10 DIAGNOSIS — I4891 Unspecified atrial fibrillation: Secondary | ICD-10-CM

## 2011-11-10 LAB — POCT INR: INR: 2.4

## 2011-11-24 ENCOUNTER — Ambulatory Visit (INDEPENDENT_AMBULATORY_CARE_PROVIDER_SITE_OTHER): Payer: Medicare Other | Admitting: Family Medicine

## 2011-11-24 ENCOUNTER — Encounter: Payer: Self-pay | Admitting: Family Medicine

## 2011-11-24 VITALS — BP 118/81 | HR 63 | Temp 97.9°F | Ht 62.0 in | Wt 263.0 lb

## 2011-11-24 DIAGNOSIS — L2089 Other atopic dermatitis: Secondary | ICD-10-CM

## 2011-11-24 DIAGNOSIS — L209 Atopic dermatitis, unspecified: Secondary | ICD-10-CM | POA: Insufficient documentation

## 2011-11-24 MED ORDER — HYDROCORTISONE 2.5 % EX LOTN: TOPICAL_LOTION | Freq: Two times a day (BID) | CUTANEOUS | Status: AC

## 2011-11-24 MED ORDER — CETIRIZINE HCL 10 MG PO TABS
10.0000 mg | ORAL_TABLET | Freq: Every day | ORAL | Status: DC
Start: 2011-11-24 — End: 2014-07-23

## 2011-11-24 NOTE — Patient Instructions (Signed)
It was a pleasure to see you today.  I believe that your rash and itch is a reaction to something in your environment.   I am prescribing a topical steroid lotion, called Hydrocortisone 2.5% lotion, to apply two times daily.  Use for no more than 2 weeks consecutively.   Also, resume the Zyrtec 10mg  one time daily, for the itch.    I agree with the baby oil and oatmeal baths, which can provide some relief.   Follow up if not better.

## 2011-11-24 NOTE — Progress Notes (Signed)
Abigail Duncan is a 71 y.o. female with PMHx of diabetes and HTN who presents to [facility] today for rash.  Pt reports that she noticed an itch all over her back about 4 days ago.  She says that since then, it has not improved at all.  Oatmeal baths help relieve pruritis, but other creams do not.  Denies associated burning, tingling, or pain with the rash.  She says that it is the worst when she is lying down but it is extremely itchy all the time.  She denies any changes in detergent, clothes, environment, soaps, or foods.  No sick contacts,recent travel, or med changes.  Denies fever, chills, nausea, diarrhea, vomiting.  She says that she has never had anything like this before.     PMH reviewed.  ROS as above otherwise neg Medications reviewed. Current Outpatient Prescriptions  Medication Sig Dispense Refill  . acetaminophen (TYLENOL) 500 MG tablet As needed       . cetirizine (ZYRTEC ALLERGY) 10 MG tablet Take 10 mg by mouth daily.        . CRESTOR 20 MG tablet 1 TABLET BY MOUTH DAILY AT BEDTIME  34 tablet  9  . enalapril (VASOTEC) 10 MG tablet TAKE ONE TAB DAILY  30 tablet  11  . enalapril-hydrochlorothiazide (VASERETIC) 10-25 MG per tablet TAKE 1 TABLET EVERY DAY  30 tablet  11  . fluticasone (FLONASE) 50 MCG/ACT nasal spray 2 sprays. In each nostril daily       . HYDROcodone-acetaminophen (LORTAB) 7.5-500 MG per tablet Take 1 tablet by mouth every 6 (six) hours as needed.  100 tablet  3  . metFORMIN (GLUCOPHAGE) 500 MG tablet TAKE ONE TABLET TWO TIMES A DAY  60 tablet  11  . metoprolol (LOPRESSOR) 100 MG tablet TAKE 1 TABLET BY MOUTH TWICE DAILY  60 tablet  11  . NIFEdipine (PROCARDIA XL/ADALAT-CC) 90 MG 24 hr tablet Take 1 tablet (90 mg total) by mouth daily.  90 tablet  3  . nitroGLYCERIN (NITROSTAT) 0.4 MG SL tablet Place 1 tablet (0.4 mg total) under the tongue as directed.  20 tablet  11  . warfarin (COUMADIN) 5 MG tablet TAKE AS DIRECTED  30 tablet  9    Exam:  BP 118/81  Pulse 63   Temp(Src) 97.9 F (36.6 C) (Oral)  Ht 5\' 2"  (1.575 m)  Wt 263 lb (119.296 kg)  BMI 48.10 kg/m2 Gen: Well NAD, obese  HEENT: EOMI,  MMM, erythematous ear canals Lungs: CTABL Nl WOB Heart: RRR no MRG, distant heart sounds 2/2 body habitus Exts: 1+ pitting BL  LE, warm and well perfused.  Skin: diffuse bilateral blanching erythema spreading from nape of neck, to either scapula down to mid-thoracic vertebrae; no pustules, wheals, scaling, or edema  Assessment and Plan: Abigail Duncan is a 71 yo F with PMHx of diabetes and htn who presents to PCP with pruritis and rash on upper and mid back.  Physical exam findings and history are consistent with a contact dermatitis or xerosis.  Due to condition of patient and nature of rash, skin biopsy is not necessary at this time.  1. Hydrocortisone 2.5% apply as needed 2. Zyrtec 10 mg po daily 3. Oatmeal baths and Eucerin if desired for itch relief  Abigail Duncan, MS3  Patient seen and examined with medical student Abigail Duncan (MS3), I have reviewed and personally verified all aspect of her her documentation and exam.   Patient has acute onset pruritic erythematous area on  mid-back; has not found relief with baby oil, but uses oatmeal baths with some relief.  No oral meds for this.  Scratches with a stick.  Not expanding to other areas of body.  She denies changes in hygiene products or household cleaners, etc.  No recent illness.  PE Well appearing, no apparent distress SKIN: diffuse area of blanching erythema without papules/pustules from mid-scapulae to upper lumbar area; not involving nuchal area. Not extending to intertriginous area or abdomen/extremities, or scalp.

## 2011-11-25 NOTE — Assessment & Plan Note (Signed)
Patient with diffuse blanching erythematous area across mid-back, from scapulae to upper lumbar region, not involving nape of neck, not radiating anteriorly to front.  No pustules or papules.  Not present in intertriginous areas.  Suspect atopic dermatitis, discussed use of mild-moderate potency topical steroid, continued use of oatmeal baths, emollients, follow up if not improved.  She cannot identify any new hygiene product or household product that might have provoked this.

## 2011-12-08 ENCOUNTER — Ambulatory Visit (INDEPENDENT_AMBULATORY_CARE_PROVIDER_SITE_OTHER): Payer: Medicare Other | Admitting: *Deleted

## 2011-12-08 DIAGNOSIS — Z7901 Long term (current) use of anticoagulants: Secondary | ICD-10-CM

## 2011-12-08 DIAGNOSIS — E785 Hyperlipidemia, unspecified: Secondary | ICD-10-CM

## 2011-12-08 DIAGNOSIS — I4891 Unspecified atrial fibrillation: Secondary | ICD-10-CM

## 2011-12-08 LAB — COMPREHENSIVE METABOLIC PANEL
ALT: 11 U/L (ref 0–35)
AST: 15 U/L (ref 0–37)
Albumin: 4.1 g/dL (ref 3.5–5.2)
Alkaline Phosphatase: 53 U/L (ref 39–117)
BUN: 26 mg/dL — ABNORMAL HIGH (ref 6–23)
Calcium: 9 mg/dL (ref 8.4–10.5)
Chloride: 102 mEq/L (ref 96–112)
Potassium: 4.2 mEq/L (ref 3.5–5.3)

## 2011-12-08 LAB — LIPID PANEL
HDL: 35 mg/dL — ABNORMAL LOW (ref >39)
LDL Cholesterol: 42 mg/dL (ref 0–99)
Total CHOL/HDL Ratio: 3.9 Ratio

## 2011-12-25 ENCOUNTER — Encounter: Payer: Self-pay | Admitting: Family Medicine

## 2011-12-29 ENCOUNTER — Encounter: Payer: Self-pay | Admitting: Family Medicine

## 2011-12-29 ENCOUNTER — Ambulatory Visit (INDEPENDENT_AMBULATORY_CARE_PROVIDER_SITE_OTHER): Payer: Medicare Other | Admitting: Family Medicine

## 2011-12-29 ENCOUNTER — Ambulatory Visit (INDEPENDENT_AMBULATORY_CARE_PROVIDER_SITE_OTHER): Payer: Medicare Other | Admitting: *Deleted

## 2011-12-29 VITALS — BP 137/81 | HR 79 | Ht 64.0 in | Wt 267.5 lb

## 2011-12-29 DIAGNOSIS — E119 Type 2 diabetes mellitus without complications: Secondary | ICD-10-CM

## 2011-12-29 DIAGNOSIS — I4891 Unspecified atrial fibrillation: Secondary | ICD-10-CM

## 2011-12-29 DIAGNOSIS — I10 Essential (primary) hypertension: Secondary | ICD-10-CM

## 2011-12-29 DIAGNOSIS — Z7901 Long term (current) use of anticoagulants: Secondary | ICD-10-CM

## 2011-12-29 DIAGNOSIS — L209 Atopic dermatitis, unspecified: Secondary | ICD-10-CM

## 2011-12-29 DIAGNOSIS — M5430 Sciatica, unspecified side: Secondary | ICD-10-CM

## 2011-12-29 DIAGNOSIS — M1710 Unilateral primary osteoarthritis, unspecified knee: Secondary | ICD-10-CM

## 2011-12-29 DIAGNOSIS — L2089 Other atopic dermatitis: Secondary | ICD-10-CM

## 2011-12-29 DIAGNOSIS — Z780 Asymptomatic menopausal state: Secondary | ICD-10-CM

## 2011-12-29 MED ORDER — HYDROCODONE-ACETAMINOPHEN 7.5-500 MG PO TABS: 1.0000 | ORAL_TABLET | Freq: Four times a day (QID) | ORAL | Status: DC | PRN

## 2011-12-29 NOTE — Assessment & Plan Note (Signed)
resolved 

## 2011-12-29 NOTE — Assessment & Plan Note (Addendum)
Rate controlled and seems to be in NSR

## 2011-12-29 NOTE — Assessment & Plan Note (Signed)
Not at goal, encouraged weight loss and salt restriction

## 2011-12-29 NOTE — Progress Notes (Signed)
  Subjective:    Patient ID: Abigail Duncan, female    DOB: 1940/12/28, 71 y.o.   MRN: [id number]  HPIshe admits that she is gets and other bread but she can't resist. She also realizes she is ingesting more salt than she should.  She sleeps on 2 pillows chronically but has not had PND or orthopnea.   No recent visit chest pain. Has her nitroglycerin with her  Can't recall when she had her last bone mineral density test done.  Plan to schedule her mammogram in 2 weeks when she's on vacation  She's not recently had a need to use her nasal steroid or antihistamine  She continues on her hydrocodone for her low back pain without any red flags. Her walking is limited to walking into and to the  back of [employer] when she first clocks in, when she eats lunch and then leaves for the day. Working 36 hours weekly  No recent hypoglycemic symptoms. She plans to schedule her ophthalmologic exam  Review of Systems     Objective:   Physical Exam  Constitutional:       morbid generalized obesity  Cardiovascular: Normal rate, regular rhythm and normal heart sounds.   No murmur heard. Pulmonary/Chest: Effort normal and breath sounds normal. No respiratory distress. She has no wheezes. She has no rales.  Abdominal: Soft. She exhibits no mass. There is no tenderness. There is no rebound and no guarding.       Exam limited by abdominal obesity  Musculoskeletal: She exhibits edema.       2 Plus right and 1+ left ankle edema          Assessment & Plan:

## 2011-12-29 NOTE — Patient Instructions (Signed)
Please return to see Dr Walker Kehr in 1months.  Work on losing weight and avoiding salt to reach a blood pressure under 130/80  Increase walking by parking further away at your job.

## 2011-12-29 NOTE — Assessment & Plan Note (Signed)
well controlled  

## 2012-01-05 ENCOUNTER — Ambulatory Visit: Payer: Medicare Other

## 2012-01-16 ENCOUNTER — Other Ambulatory Visit: Payer: Self-pay | Admitting: Family Medicine

## 2012-01-19 ENCOUNTER — Encounter: Payer: Self-pay | Admitting: Family Medicine

## 2012-01-19 ENCOUNTER — Ambulatory Visit (INDEPENDENT_AMBULATORY_CARE_PROVIDER_SITE_OTHER): Payer: Medicare Other | Admitting: Family Medicine

## 2012-01-19 VITALS — BP 146/86 | HR 98 | Ht 62.0 in | Wt 267.0 lb

## 2012-01-19 DIAGNOSIS — M1710 Unilateral primary osteoarthritis, unspecified knee: Secondary | ICD-10-CM

## 2012-01-19 NOTE — Assessment & Plan Note (Addendum)
R knee injected with steroid Continue acetominophen for pain Weight loss encouraged.  If no improvement consider Ortho consult

## 2012-01-19 NOTE — Patient Instructions (Signed)
Return as scheduled. Sooner if the joint gets worse instead of better or if any signs of infection.  Avoid step and squats until the knee has improved.

## 2012-01-19 NOTE — Progress Notes (Signed)
  Subjective:    Patient ID: Abigail Duncan, female    DOB: [birth year], 71 y.o.   MRN: [id number]  HPI The right knee continues to be painful and she would like a steroid injection which helped in the past.    Review of Systems     Objective:   Physical Exam  Musculoskeletal:       Obese legs. 1+ ankle edema bilaterally Both knees are large without effusion and are stable with full range of motion. right knee tender along the medial joint line.  After obtaining consent and doing sterile preparation the right knee was injected in the medial suprapatellar pouch with 3 cc Triamcinolone 10 mg and 5 cc of Marcaine. She had some relief of weight bearing pain immediately after the procedure.    Xrays from 2003 showed DJD in both knees       Assessment & Plan:

## 2012-01-26 ENCOUNTER — Ambulatory Visit (INDEPENDENT_AMBULATORY_CARE_PROVIDER_SITE_OTHER): Payer: Medicare Other | Admitting: *Deleted

## 2012-01-26 DIAGNOSIS — Z7901 Long term (current) use of anticoagulants: Secondary | ICD-10-CM

## 2012-01-26 DIAGNOSIS — I4891 Unspecified atrial fibrillation: Secondary | ICD-10-CM

## 2012-02-13 ENCOUNTER — Other Ambulatory Visit: Payer: Self-pay | Admitting: Family Medicine

## 2012-02-23 ENCOUNTER — Ambulatory Visit (INDEPENDENT_AMBULATORY_CARE_PROVIDER_SITE_OTHER): Payer: Medicare Other | Admitting: *Deleted

## 2012-02-23 DIAGNOSIS — Z7901 Long term (current) use of anticoagulants: Secondary | ICD-10-CM

## 2012-02-23 DIAGNOSIS — I4891 Unspecified atrial fibrillation: Secondary | ICD-10-CM

## 2012-02-23 LAB — POCT INR: INR: 2.7

## 2012-03-22 ENCOUNTER — Ambulatory Visit (INDEPENDENT_AMBULATORY_CARE_PROVIDER_SITE_OTHER): Payer: Medicare Other | Admitting: *Deleted

## 2012-03-22 DIAGNOSIS — I4891 Unspecified atrial fibrillation: Secondary | ICD-10-CM

## 2012-03-22 DIAGNOSIS — Z7901 Long term (current) use of anticoagulants: Secondary | ICD-10-CM

## 2012-03-22 LAB — POCT INR: INR: 2.2

## 2012-03-29 ENCOUNTER — Other Ambulatory Visit: Payer: Self-pay | Admitting: Family Medicine

## 2012-04-05 ENCOUNTER — Other Ambulatory Visit: Payer: Self-pay | Admitting: Family Medicine

## 2012-04-11 ENCOUNTER — Other Ambulatory Visit: Payer: Self-pay | Admitting: *Deleted

## 2012-04-11 DIAGNOSIS — I10 Essential (primary) hypertension: Secondary | ICD-10-CM

## 2012-04-11 MED ORDER — METOPROLOL TARTRATE 100 MG PO TABS: 100.0000 mg | ORAL_TABLET | Freq: Two times a day (BID) | ORAL | Status: DC

## 2012-04-11 MED ORDER — METFORMIN HCL 500 MG PO TABS: 500.0000 mg | ORAL_TABLET | Freq: Two times a day (BID) | ORAL | Status: DC

## 2012-04-21 ENCOUNTER — Other Ambulatory Visit: Payer: Self-pay | Admitting: Family Medicine

## 2012-04-21 DIAGNOSIS — M5430 Sciatica, unspecified side: Secondary | ICD-10-CM

## 2012-04-21 MED ORDER — HYDROCODONE-ACETAMINOPHEN 7.5-500 MG PO TABS: 1.0000 | ORAL_TABLET | Freq: Four times a day (QID) | ORAL | Status: DC | PRN

## 2012-04-26 ENCOUNTER — Ambulatory Visit (INDEPENDENT_AMBULATORY_CARE_PROVIDER_SITE_OTHER): Payer: Medicare Other | Admitting: *Deleted

## 2012-04-26 DIAGNOSIS — I4891 Unspecified atrial fibrillation: Secondary | ICD-10-CM

## 2012-04-26 DIAGNOSIS — Z7901 Long term (current) use of anticoagulants: Secondary | ICD-10-CM

## 2012-04-26 LAB — POCT INR: INR: 2

## 2012-05-17 ENCOUNTER — Ambulatory Visit (INDEPENDENT_AMBULATORY_CARE_PROVIDER_SITE_OTHER): Payer: Medicare Other | Admitting: *Deleted

## 2012-05-17 ENCOUNTER — Ambulatory Visit (INDEPENDENT_AMBULATORY_CARE_PROVIDER_SITE_OTHER): Payer: Medicare Other | Admitting: Family Medicine

## 2012-05-17 ENCOUNTER — Encounter: Payer: Self-pay | Admitting: Family Medicine

## 2012-05-17 VITALS — BP 130/80 | HR 98 | Ht 62.0 in | Wt 261.0 lb

## 2012-05-17 DIAGNOSIS — I4891 Unspecified atrial fibrillation: Secondary | ICD-10-CM

## 2012-05-17 DIAGNOSIS — E119 Type 2 diabetes mellitus without complications: Secondary | ICD-10-CM

## 2012-05-17 DIAGNOSIS — Z7901 Long term (current) use of anticoagulants: Secondary | ICD-10-CM

## 2012-05-17 DIAGNOSIS — M1710 Unilateral primary osteoarthritis, unspecified knee: Secondary | ICD-10-CM

## 2012-05-17 DIAGNOSIS — M5430 Sciatica, unspecified side: Secondary | ICD-10-CM

## 2012-05-17 DIAGNOSIS — I10 Essential (primary) hypertension: Secondary | ICD-10-CM

## 2012-05-17 LAB — POCT GLYCOSYLATED HEMOGLOBIN (HGB A1C): Hemoglobin A1C: 6.3

## 2012-05-17 MED ORDER — HYDROCODONE-ACETAMINOPHEN 7.5-500 MG PO TABS: 1.0000 | ORAL_TABLET | Freq: Four times a day (QID) | ORAL | Status: DC | PRN

## 2012-05-17 NOTE — Patient Instructions (Addendum)
Your repeat blood pressure was 130/80  Gradually increase your walking   Please return to see Dr Walker Kehr in 6months.

## 2012-05-18 NOTE — Assessment & Plan Note (Signed)
well controlled  

## 2012-05-18 NOTE — Progress Notes (Signed)
  Subjective:    Patient ID: Abigail Duncan, female    DOB: December 23, 1940, 71 y.o.   MRN: [id number]  HPI Back pain - This is tolerable on the pain medication. 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 No chest pain or increasing dyspnea on exertion.   Hip - pain on right side at times  Anticoagulation - recent INR's have been stable. No bleeding.  Atrial fib - No chest pain or palpitations felt.  DM - no hypoglycemia episodes. Thinks she had an eye exam this year.   HM - still hasn't schedule her mammogram. Doesn't like them, but willing to get one and BMD test  Sleep - trouble initiating, goes to bed 9-11 PM   see HPI for ROS  Review of Systems     Objective:   Physical Exam  Constitutional: She is oriented to person, place, and time.       Generalized obesity no apparent distress   Cardiovascular: Normal rate.   No murmur heard.      Irregularly irreg  Pulmonary/Chest: Effort normal and breath sounds normal.  Musculoskeletal: She exhibits no edema.       Hips - decreased internal rotation on right compared with left  Neurological: She is alert and oriented to person, place, and time.  Psychiatric: She has a normal mood and affect. Her behavior is normal. Judgment and thought content normal.          Assessment & Plan:

## 2012-05-18 NOTE — Assessment & Plan Note (Signed)
Congratulated on her weight loss. Also occurred last year, so encouraged to keep it off this year.

## 2012-05-18 NOTE — Assessment & Plan Note (Signed)
Use medication to increase function

## 2012-05-19 MED ORDER — NIFEDIPINE ER OSMOTIC RELEASE 90 MG PO TB24: 90.0000 mg | ORAL_TABLET | Freq: Every day | ORAL | Status: DC

## 2012-05-19 NOTE — Assessment & Plan Note (Signed)
Clinically suspect more DJD on right hip. Will xray if more symptomatic.

## 2012-06-14 ENCOUNTER — Ambulatory Visit (INDEPENDENT_AMBULATORY_CARE_PROVIDER_SITE_OTHER): Payer: Medicare Other | Admitting: *Deleted

## 2012-06-14 DIAGNOSIS — Z7901 Long term (current) use of anticoagulants: Secondary | ICD-10-CM

## 2012-06-14 DIAGNOSIS — I4891 Unspecified atrial fibrillation: Secondary | ICD-10-CM

## 2012-06-14 LAB — POCT INR: INR: 2.5

## 2012-06-24 ENCOUNTER — Encounter: Payer: Self-pay | Admitting: Family Medicine

## 2012-07-19 ENCOUNTER — Ambulatory Visit (INDEPENDENT_AMBULATORY_CARE_PROVIDER_SITE_OTHER): Payer: Medicare Other | Admitting: *Deleted

## 2012-07-19 DIAGNOSIS — I4891 Unspecified atrial fibrillation: Secondary | ICD-10-CM

## 2012-07-19 DIAGNOSIS — Z7901 Long term (current) use of anticoagulants: Secondary | ICD-10-CM

## 2012-08-11 ENCOUNTER — Other Ambulatory Visit: Payer: Self-pay | Admitting: Family Medicine

## 2012-08-16 ENCOUNTER — Encounter: Payer: Self-pay | Admitting: Family Medicine

## 2012-08-16 ENCOUNTER — Ambulatory Visit (INDEPENDENT_AMBULATORY_CARE_PROVIDER_SITE_OTHER): Payer: Medicare Other | Admitting: Family Medicine

## 2012-08-16 ENCOUNTER — Ambulatory Visit (INDEPENDENT_AMBULATORY_CARE_PROVIDER_SITE_OTHER): Payer: Medicare Other | Admitting: *Deleted

## 2012-08-16 VITALS — BP 132/76 | HR 99 | Ht 64.0 in | Wt 262.0 lb

## 2012-08-16 DIAGNOSIS — I4891 Unspecified atrial fibrillation: Secondary | ICD-10-CM

## 2012-08-16 DIAGNOSIS — E119 Type 2 diabetes mellitus without complications: Secondary | ICD-10-CM

## 2012-08-16 DIAGNOSIS — M1710 Unilateral primary osteoarthritis, unspecified knee: Secondary | ICD-10-CM

## 2012-08-16 DIAGNOSIS — Z7901 Long term (current) use of anticoagulants: Secondary | ICD-10-CM

## 2012-08-16 DIAGNOSIS — I10 Essential (primary) hypertension: Secondary | ICD-10-CM

## 2012-08-16 DIAGNOSIS — M5430 Sciatica, unspecified side: Secondary | ICD-10-CM

## 2012-08-16 LAB — POCT GLYCOSYLATED HEMOGLOBIN (HGB A1C): Hemoglobin A1C: 6.2

## 2012-08-16 MED ORDER — ENALAPRIL-HYDROCHLOROTHIAZIDE 10-25 MG PO TABS: 1.0000 | ORAL_TABLET | Freq: Every day | ORAL | Status: DC

## 2012-08-16 MED ORDER — HYDROCODONE-ACETAMINOPHEN 7.5-325 MG PO TABS: 1.0000 | ORAL_TABLET | Freq: Four times a day (QID) | ORAL | Status: DC | PRN

## 2012-08-16 NOTE — Patient Instructions (Addendum)
Your diabetes control is excellent.   Your blood pressure is borderline. Try harder to lose weight and reduce salt.   I recommend restarting at the recreation center.  Please return to see Dr Walker Kehr in 53months.

## 2012-08-17 NOTE — Assessment & Plan Note (Signed)
Rate controlled 

## 2012-08-17 NOTE — Progress Notes (Signed)
  Subjective:    Patient ID: Abigail Duncan, female    DOB: 05-04-1941, 71 y.o.   MRN: [id number]  HPI CAD - She denies chest pain or use of ntg. No shortness of breath   Arthritis - Activity is walking to the back of [employer]. right knee has been hurting recently.   DM - No hypoglycemic symptoms   Anticoagulation - no bleeding  AF - no lightheadedness or palpitations  Hypertension - blood pressures about 135/85 at home   see HPI for ROS   Review of Systems     Objective:   Physical Exam  Constitutional:       Generalized obesity   Cardiovascular: Normal rate.   Pulmonary/Chest: Effort normal and breath sounds normal. She has no rales.  Musculoskeletal: She exhibits edema.       Bilateral trace edema  Neurological: She is alert.  Psychiatric: She has a normal mood and affect. Her behavior is normal. Thought content normal.          Assessment & Plan:

## 2012-08-17 NOTE — Assessment & Plan Note (Signed)
well controlled  

## 2012-08-17 NOTE — Assessment & Plan Note (Signed)
DJD flare, reminded that losing weight would help

## 2012-08-17 NOTE — Assessment & Plan Note (Signed)
Close to good control

## 2012-08-31 ENCOUNTER — Other Ambulatory Visit: Payer: Self-pay | Admitting: Family Medicine

## 2012-09-13 ENCOUNTER — Ambulatory Visit (INDEPENDENT_AMBULATORY_CARE_PROVIDER_SITE_OTHER): Payer: Medicare Other | Admitting: *Deleted

## 2012-09-13 DIAGNOSIS — Z7901 Long term (current) use of anticoagulants: Secondary | ICD-10-CM

## 2012-09-13 DIAGNOSIS — I4891 Unspecified atrial fibrillation: Secondary | ICD-10-CM

## 2012-09-13 LAB — POCT INR: INR: 2.2

## 2012-10-06 ENCOUNTER — Other Ambulatory Visit: Payer: Self-pay | Admitting: Family Medicine

## 2012-10-18 ENCOUNTER — Ambulatory Visit (INDEPENDENT_AMBULATORY_CARE_PROVIDER_SITE_OTHER): Payer: Medicare Other | Admitting: *Deleted

## 2012-10-18 DIAGNOSIS — Z7901 Long term (current) use of anticoagulants: Secondary | ICD-10-CM

## 2012-10-18 DIAGNOSIS — I4891 Unspecified atrial fibrillation: Secondary | ICD-10-CM

## 2012-11-22 ENCOUNTER — Ambulatory Visit (INDEPENDENT_AMBULATORY_CARE_PROVIDER_SITE_OTHER): Payer: Medicare Other | Admitting: *Deleted

## 2012-11-22 DIAGNOSIS — I4891 Unspecified atrial fibrillation: Secondary | ICD-10-CM

## 2012-11-22 DIAGNOSIS — Z7901 Long term (current) use of anticoagulants: Secondary | ICD-10-CM

## 2012-12-04 ENCOUNTER — Other Ambulatory Visit: Payer: Self-pay | Admitting: Family Medicine

## 2012-12-05 ENCOUNTER — Other Ambulatory Visit: Payer: Self-pay | Admitting: Family Medicine

## 2012-12-07 ENCOUNTER — Other Ambulatory Visit: Payer: Self-pay | Admitting: Family Medicine

## 2012-12-13 ENCOUNTER — Ambulatory Visit: Payer: Medicare Other

## 2012-12-15 ENCOUNTER — Ambulatory Visit (INDEPENDENT_AMBULATORY_CARE_PROVIDER_SITE_OTHER): Payer: Medicare Other | Admitting: *Deleted

## 2012-12-15 DIAGNOSIS — I10 Essential (primary) hypertension: Secondary | ICD-10-CM

## 2012-12-15 DIAGNOSIS — Z7901 Long term (current) use of anticoagulants: Secondary | ICD-10-CM

## 2012-12-15 DIAGNOSIS — I4891 Unspecified atrial fibrillation: Secondary | ICD-10-CM

## 2012-12-15 DIAGNOSIS — E785 Hyperlipidemia, unspecified: Secondary | ICD-10-CM

## 2012-12-15 DIAGNOSIS — E119 Type 2 diabetes mellitus without complications: Secondary | ICD-10-CM

## 2012-12-15 LAB — POCT GLYCOSYLATED HEMOGLOBIN (HGB A1C): Hemoglobin A1C: 6.2

## 2012-12-15 LAB — COMPREHENSIVE METABOLIC PANEL
Albumin: 4 g/dL (ref 3.5–5.2)
BUN: 28 mg/dL — ABNORMAL HIGH (ref 6–23)
Calcium: 9.3 mg/dL (ref 8.4–10.5)
Chloride: 102 mEq/L (ref 96–112)
Glucose, Bld: 122 mg/dL — ABNORMAL HIGH (ref 70–99)
Potassium: 4.2 mEq/L (ref 3.5–5.3)
Sodium: 138 mEq/L (ref 135–145)
Total Protein: 6.5 g/dL (ref 6.0–8.3)

## 2012-12-15 LAB — CBC
HCT: 41.2 % (ref 36.0–46.0)
Hemoglobin: 14.4 g/dL (ref 12.0–15.0)
MCH: 30.3 pg (ref 26.0–34.0)
MCHC: 35 g/dL (ref 30.0–36.0)
RBC: 4.76 MIL/uL (ref 3.87–5.11)

## 2012-12-15 LAB — LIPID PANEL
HDL: 37 mg/dL — ABNORMAL LOW (ref >39)
Triglycerides: 249 mg/dL — ABNORMAL HIGH (ref <150)

## 2012-12-15 LAB — TSH: TSH: 2.508 u[IU]/mL (ref 0.350–4.500)

## 2012-12-15 NOTE — Progress Notes (Signed)
Fasting CMET, FLP, TSH, CBC, INR, A1c done today.  Orders per Dr. Walker Kehr.  Pt has ov scheduled for Tues. Unk Lightning, MLS

## 2012-12-15 NOTE — Addendum Note (Signed)
Addended by: [place], Brena Windsor on: 12/15/2012 01:05 PM   Modules accepted: Orders

## 2012-12-20 ENCOUNTER — Ambulatory Visit: Payer: Medicare Other | Admitting: Family Medicine

## 2012-12-29 ENCOUNTER — Ambulatory Visit (INDEPENDENT_AMBULATORY_CARE_PROVIDER_SITE_OTHER): Payer: Medicare Other | Admitting: Family Medicine

## 2012-12-29 ENCOUNTER — Encounter: Payer: Self-pay | Admitting: Family Medicine

## 2012-12-29 VITALS — BP 116/62 | HR 80 | Ht 63.0 in | Wt 263.0 lb

## 2012-12-29 DIAGNOSIS — M1710 Unilateral primary osteoarthritis, unspecified knee: Secondary | ICD-10-CM

## 2012-12-29 DIAGNOSIS — I10 Essential (primary) hypertension: Secondary | ICD-10-CM

## 2012-12-29 DIAGNOSIS — M5431 Sciatica, right side: Secondary | ICD-10-CM

## 2012-12-29 DIAGNOSIS — IMO0002 Reserved for concepts with insufficient information to code with codable children: Secondary | ICD-10-CM

## 2012-12-29 DIAGNOSIS — E119 Type 2 diabetes mellitus without complications: Secondary | ICD-10-CM

## 2012-12-29 DIAGNOSIS — E66813 Obesity, class 3: Secondary | ICD-10-CM

## 2012-12-29 DIAGNOSIS — M5430 Sciatica, unspecified side: Secondary | ICD-10-CM

## 2012-12-29 DIAGNOSIS — I4891 Unspecified atrial fibrillation: Secondary | ICD-10-CM

## 2012-12-29 MED ORDER — ENALAPRIL MALEATE 10 MG PO TABS: ORAL_TABLET | ORAL | Status: DC

## 2012-12-29 MED ORDER — HYDROCODONE-ACETAMINOPHEN 7.5-325 MG PO TABS: ORAL_TABLET | ORAL | Status: DC

## 2012-12-29 NOTE — Assessment & Plan Note (Signed)
well controlled by recent normal A1c without hypoglycemia symptoms

## 2012-12-29 NOTE — Assessment & Plan Note (Signed)
No improvement

## 2012-12-29 NOTE — Assessment & Plan Note (Signed)
well controlled  

## 2012-12-29 NOTE — Assessment & Plan Note (Signed)
DJD in knees and clinically in right hip. I emphasized the importance of losing weight.

## 2012-12-29 NOTE — Patient Instructions (Addendum)
Please return to see Dr Walker Kehr in 3 months  Add a 325 mg Acetaminophen three times daily to your hydrocodone acetaminophen.   Please schedule your mammogram

## 2012-12-29 NOTE — Progress Notes (Signed)
  Subjective:    Patient ID: Abigail Duncan, female    DOB: 09-22-1941, 72 y.o.   MRN: [id number]  HPI DJD - Knees hurting R>L, worse doing steps and squatting. No falls. Not very active this winter. Not taking any additional acetaminophen.   Chronic low back pain - well controlled on hydrocodone. Mild discomfort going down the right leg.  Diabetes - no diabetic symptoms or hypoglycemia. Occasionally has brief numbness in feel. Promises to schedule an ey exam at [facility].   CAD - No chest pain or shortness of breath.    Review of Systems     Objective:   Physical Exam  Constitutional: She is oriented to person, place, and time.  Generalized obesity   Cardiovascular: Normal rate.   No murmur heard. Irregularly  Irregular, rate controlled  Pulmonary/Chest: Effort normal and breath sounds normal. She has no wheezes. She has no rales.  Musculoskeletal: She exhibits no edema.  Ankles obese but not pitting  Negative sitting root test on right   Tender over both anterior-medial joint lines without warmth or effusion. Full range of motion without crepitus.   Neurological: She is alert and oriented to person, place, and time.  Psychiatric: She has a normal mood and affect. Her behavior is normal. Judgment and thought content normal.          Assessment & Plan:

## 2012-12-29 NOTE — Assessment & Plan Note (Signed)
well controlled on recheck

## 2012-12-29 NOTE — Assessment & Plan Note (Signed)
Rate controlled 

## 2013-01-17 ENCOUNTER — Ambulatory Visit: Payer: Medicare Other

## 2013-01-23 ENCOUNTER — Ambulatory Visit (INDEPENDENT_AMBULATORY_CARE_PROVIDER_SITE_OTHER): Payer: Medicare Other | Admitting: *Deleted

## 2013-01-23 ENCOUNTER — Encounter: Payer: Self-pay | Admitting: *Deleted

## 2013-01-23 ENCOUNTER — Other Ambulatory Visit: Payer: Self-pay | Admitting: Family Medicine

## 2013-01-23 DIAGNOSIS — I4891 Unspecified atrial fibrillation: Secondary | ICD-10-CM

## 2013-01-23 DIAGNOSIS — Z7901 Long term (current) use of anticoagulants: Secondary | ICD-10-CM

## 2013-01-23 LAB — POCT INR: INR: 2.4

## 2013-01-26 ENCOUNTER — Other Ambulatory Visit: Payer: Self-pay | Admitting: Family Medicine

## 2013-02-26 ENCOUNTER — Other Ambulatory Visit: Payer: Self-pay | Admitting: Family Medicine

## 2013-02-28 ENCOUNTER — Other Ambulatory Visit: Payer: Self-pay | Admitting: Family Medicine

## 2013-02-28 ENCOUNTER — Other Ambulatory Visit: Payer: Self-pay | Admitting: *Deleted

## 2013-02-28 ENCOUNTER — Ambulatory Visit (INDEPENDENT_AMBULATORY_CARE_PROVIDER_SITE_OTHER): Payer: Medicare Other | Admitting: *Deleted

## 2013-02-28 DIAGNOSIS — I10 Essential (primary) hypertension: Secondary | ICD-10-CM

## 2013-02-28 DIAGNOSIS — Z7901 Long term (current) use of anticoagulants: Secondary | ICD-10-CM

## 2013-02-28 DIAGNOSIS — I4891 Unspecified atrial fibrillation: Secondary | ICD-10-CM

## 2013-02-28 LAB — POCT INR: INR: 2

## 2013-02-28 MED ORDER — ENALAPRIL-HYDROCHLOROTHIAZIDE 10-25 MG PO TABS: 1.0000 | ORAL_TABLET | Freq: Every day | ORAL | Status: DC

## 2013-02-28 NOTE — Telephone Encounter (Signed)
Requested Prescriptions   Pending Prescriptions Disp Refills  . enalapril-hydrochlorothiazide (VASERETIC) 10-25 MG per tablet 30 tablet 11    Sig: Take 1 tablet by mouth daily.

## 2013-02-28 NOTE — Telephone Encounter (Signed)
I believe this is the 4th request I've gotten for this, even though I've signed it over duplicate warnings. Where's the problem?

## 2013-03-28 ENCOUNTER — Ambulatory Visit (INDEPENDENT_AMBULATORY_CARE_PROVIDER_SITE_OTHER): Payer: Medicare Other | Admitting: Family Medicine

## 2013-03-28 ENCOUNTER — Ambulatory Visit (INDEPENDENT_AMBULATORY_CARE_PROVIDER_SITE_OTHER): Payer: Medicare Other | Admitting: *Deleted

## 2013-03-28 ENCOUNTER — Encounter: Payer: Self-pay | Admitting: Family Medicine

## 2013-03-28 VITALS — BP 138/84 | HR 91 | Ht 63.0 in | Wt 263.0 lb

## 2013-03-28 DIAGNOSIS — I2510 Atherosclerotic heart disease of native coronary artery without angina pectoris: Secondary | ICD-10-CM

## 2013-03-28 DIAGNOSIS — M5431 Sciatica, right side: Secondary | ICD-10-CM

## 2013-03-28 DIAGNOSIS — Z7901 Long term (current) use of anticoagulants: Secondary | ICD-10-CM

## 2013-03-28 DIAGNOSIS — I4891 Unspecified atrial fibrillation: Secondary | ICD-10-CM

## 2013-03-28 DIAGNOSIS — I10 Essential (primary) hypertension: Secondary | ICD-10-CM

## 2013-03-28 DIAGNOSIS — E119 Type 2 diabetes mellitus without complications: Secondary | ICD-10-CM

## 2013-03-28 DIAGNOSIS — M5430 Sciatica, unspecified side: Secondary | ICD-10-CM

## 2013-03-28 MED ORDER — HYDROCODONE-ACETAMINOPHEN 7.5-325 MG PO TABS: ORAL_TABLET | ORAL | Status: DC

## 2013-03-28 MED ORDER — FLUTICASONE PROPIONATE 50 MCG/ACT NA SUSP: 2.0000 | Freq: Every day | NASAL | Status: DC

## 2013-03-28 MED ORDER — WARFARIN SODIUM 5 MG PO TABS: ORAL_TABLET | ORAL | Status: DC

## 2013-03-28 NOTE — Patient Instructions (Signed)
Please return to see Dr Ree Kida in 3 months.  I will be retiring in the summer of this year. Thank you for allowing me to work with you in maintaining your health. Dossie Arbour, MD will be my replacement beginning the second week in August. He is moving to [place] having completed his family medicine residency training in [place], [place]. Please let me know if you would like to be assigned to a different physician.

## 2013-03-28 NOTE — Progress Notes (Signed)
  Subjective:    Patient ID: Abigail Duncan, female    DOB: May 29, 1941, 72 y.o.   MRN: [id number]  HPI CAD - no chest pain or shortness of breath  DJD - her feet hurt. Does some standing on her part-time job at [employer]  Obesity - hasn't been able to lose weight. Her insurance has a Silver Sneakers benefit that she is considering using.   Back pain - continue to require the Vicodin. 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  Review of Systems     Objective:   Physical Exam  Constitutional:  Morbid generalized obesity  Cardiovascular: Normal rate.   No murmur heard. Irregularly irregular  Pulmonary/Chest: Effort normal and breath sounds normal.  Musculoskeletal: She exhibits edema.  Trace bilateral edema  Psychiatric: She has a normal mood and affect. Her behavior is normal. Judgment and thought content normal.          Assessment & Plan:

## 2013-03-29 NOTE — Assessment & Plan Note (Signed)
Fair control.

## 2013-03-29 NOTE — Assessment & Plan Note (Signed)
Continues to function with low dose narcotic dosing I encouraged an exercise program under supervison

## 2013-03-29 NOTE — Assessment & Plan Note (Signed)
Well controlled 

## 2013-03-29 NOTE — Assessment & Plan Note (Signed)
Well controlled on current meds. I encouraged weight loss

## 2013-03-29 NOTE — Assessment & Plan Note (Signed)
No recent symptoms

## 2013-04-13 ENCOUNTER — Other Ambulatory Visit: Payer: Self-pay

## 2013-05-02 ENCOUNTER — Ambulatory Visit (INDEPENDENT_AMBULATORY_CARE_PROVIDER_SITE_OTHER): Payer: Medicare Other | Admitting: *Deleted

## 2013-05-02 DIAGNOSIS — Z7901 Long term (current) use of anticoagulants: Secondary | ICD-10-CM

## 2013-05-02 DIAGNOSIS — I4891 Unspecified atrial fibrillation: Secondary | ICD-10-CM

## 2013-05-24 ENCOUNTER — Other Ambulatory Visit: Payer: Self-pay | Admitting: Family Medicine

## 2013-06-06 ENCOUNTER — Ambulatory Visit (INDEPENDENT_AMBULATORY_CARE_PROVIDER_SITE_OTHER): Payer: Medicare Other | Admitting: *Deleted

## 2013-06-06 DIAGNOSIS — Z7901 Long term (current) use of anticoagulants: Secondary | ICD-10-CM

## 2013-06-06 DIAGNOSIS — I4891 Unspecified atrial fibrillation: Secondary | ICD-10-CM

## 2013-06-20 ENCOUNTER — Ambulatory Visit (INDEPENDENT_AMBULATORY_CARE_PROVIDER_SITE_OTHER): Payer: Medicare Other | Admitting: *Deleted

## 2013-06-20 DIAGNOSIS — I4891 Unspecified atrial fibrillation: Secondary | ICD-10-CM

## 2013-06-20 DIAGNOSIS — Z7901 Long term (current) use of anticoagulants: Secondary | ICD-10-CM

## 2013-07-07 ENCOUNTER — Ambulatory Visit: Payer: Medicare Other | Admitting: Family Medicine

## 2013-07-28 ENCOUNTER — Encounter: Payer: Self-pay | Admitting: Family Medicine

## 2013-07-28 ENCOUNTER — Ambulatory Visit (INDEPENDENT_AMBULATORY_CARE_PROVIDER_SITE_OTHER): Payer: Medicare Other | Admitting: *Deleted

## 2013-07-28 ENCOUNTER — Ambulatory Visit (INDEPENDENT_AMBULATORY_CARE_PROVIDER_SITE_OTHER): Payer: Medicare Other | Admitting: Family Medicine

## 2013-07-28 VITALS — BP 146/85 | HR 98 | Ht 63.0 in | Wt 265.0 lb

## 2013-07-28 DIAGNOSIS — I4891 Unspecified atrial fibrillation: Secondary | ICD-10-CM

## 2013-07-28 DIAGNOSIS — I10 Essential (primary) hypertension: Secondary | ICD-10-CM

## 2013-07-28 DIAGNOSIS — E119 Type 2 diabetes mellitus without complications: Secondary | ICD-10-CM

## 2013-07-28 DIAGNOSIS — Z7901 Long term (current) use of anticoagulants: Secondary | ICD-10-CM

## 2013-07-28 DIAGNOSIS — E785 Hyperlipidemia, unspecified: Secondary | ICD-10-CM

## 2013-07-28 LAB — POCT INR: INR: 2.4

## 2013-07-28 MED ORDER — ENALAPRIL-HYDROCHLOROTHIAZIDE 10-25 MG PO TABS: 1.0000 | ORAL_TABLET | Freq: Every day | ORAL | Status: DC

## 2013-07-28 MED ORDER — HYDROCODONE-ACETAMINOPHEN 7.5-325 MG PO TABS: ORAL_TABLET | ORAL | Status: DC

## 2013-07-28 MED ORDER — NIFEDIPINE ER OSMOTIC RELEASE 90 MG PO TB24: 90.0000 mg | ORAL_TABLET | Freq: Every day | ORAL | Status: DC

## 2013-07-28 MED ORDER — ROSUVASTATIN CALCIUM 20 MG PO TABS: 10.0000 mg | ORAL_TABLET | Freq: Every day | ORAL | Status: DC

## 2013-07-28 NOTE — Patient Instructions (Signed)
Diabetes - will check A1C and Dr. Ree Kida will you, a referral has been made to the podiatrist and you should hear from our office in one wek.   High Blood Pressure - well controlled today, will check kidney function and electrolytes  Atrial Fibrillation - check INR  Pain - refilled pain medications  Return to office in 3 months.

## 2013-07-28 NOTE — Assessment & Plan Note (Signed)
Well-controlled based on lab work performed and March of 2014. Patient has decreased her dose of Crestor to 10 mg daily secondary to muscle cramping. This is improved with a decreased dose. - Continue current dose of 10 mg Crestor daily

## 2013-07-28 NOTE — Progress Notes (Signed)
  Subjective:    Patient ID: Abigail Duncan, female    DOB: August 01, 1941, 72 y.o.   MRN: [id number]  HPI 72 year old female presents for followup of multiple medical problems.  Non-insulin-dependent type 2 diabetes without complication-patient is to for recheck of her hemoglobin A1c, she does not check her blood sugars on a regular basis, she currently takes metformin is tolerating the medication well, she has an upcoming ophthalmology appointment in November, denies foot numbness and/or tingling, no foot ulcerations, she does not follow regularly with a podiatrist.  Atrial fibrillation-patient currently on Coumadin, recent INRs have been in the therapeutic range, denies palpitations, denies dizziness, no syncope, no chest pain  Hyperlipidemia-patient is currently taking Crestor 10 mg daily, this was cut down from previous 20 mg daily due to leg cramping, she states that her leg cramping has improved on the lower dose  Reviewed previous social history  Review of Systems  Constitutional: Negative for fever, chills and fatigue.  Respiratory: Negative for apnea and chest tightness.   Cardiovascular: Negative for chest pain and palpitations.  Gastrointestinal: Negative for nausea, vomiting, abdominal pain, diarrhea and abdominal distention.  Musculoskeletal: Positive for arthralgias and myalgias.  Skin: Negative for rash.       Objective:   Physical Exam Vitals: Reviewed General: Pleasant Caucasian female, no acute distress, obese HEENT: Pupils are equal round and reactive to light, extra puncture intact, no scleral icterus, moist because murmurs, uvula midline, no pharyngeal erythema or exudate noted, neck was supple, no anterior posterior cervical lymphadenopathy, hirsutism present Cardiac: Irregular irregular rate, S1 and S2 present, no murmurs, patient does Respiratory: Clear to auscultation bilaterally, normal effort Abdomen: Soft, nontender, bowel sounds present Extremities: Trace  bilateral lower extremity edema, patient has very long toenails and her bilateral feet without evidence of onychomycosis, no foot ulceration noted Vascular: 2+ radial and dorsalis pedis pulses bilaterally        Assessment & Plan:  Please see problem specific assessment and plan.

## 2013-07-28 NOTE — Assessment & Plan Note (Signed)
Patient is currently rate controlled and asymptomatic. Due for INR checked today. -Patient to continue warfarin on a daily basis

## 2013-07-28 NOTE — Assessment & Plan Note (Signed)
Appears to be well controlled based a previous hemoglobin A1c. She is due for recheck of A1c today. Tolerating metformin well. -Patient to continue current dose of metformin -Patient to see ophthalmologist next month -Referral made to podiatry for annual foot exams and toenail clipping -Currently on ACE inhibitor for renal protection

## 2013-07-28 NOTE — Assessment & Plan Note (Signed)
Due for INR checked today.

## 2013-07-29 LAB — BASIC METABOLIC PANEL
BUN: 22 mg/dL (ref 6–23)
Calcium: 9.1 mg/dL (ref 8.4–10.5)
Creat: 0.99 mg/dL (ref 0.50–1.10)
Glucose, Bld: 100 mg/dL — ABNORMAL HIGH (ref 70–99)

## 2013-08-01 ENCOUNTER — Encounter: Payer: Self-pay | Admitting: Family Medicine

## 2013-08-08 ENCOUNTER — Telehealth: Payer: Self-pay

## 2013-08-08 NOTE — Telephone Encounter (Signed)
Referral is in [place] they will contact pt   [place]

## 2013-08-08 NOTE — Telephone Encounter (Signed)
Patient calls inquiring the status of her podiatry referral. Please call patient once appt has been scheduled.

## 2013-08-13 ENCOUNTER — Other Ambulatory Visit: Payer: Self-pay | Admitting: Family Medicine

## 2013-08-19 ENCOUNTER — Other Ambulatory Visit: Payer: Self-pay | Admitting: Family Medicine

## 2013-08-22 ENCOUNTER — Other Ambulatory Visit: Payer: Self-pay | Admitting: Family Medicine

## 2013-08-28 ENCOUNTER — Ambulatory Visit (INDEPENDENT_AMBULATORY_CARE_PROVIDER_SITE_OTHER): Payer: Medicare Other

## 2013-08-28 ENCOUNTER — Ambulatory Visit (INDEPENDENT_AMBULATORY_CARE_PROVIDER_SITE_OTHER): Payer: Medicare Other | Admitting: Podiatry

## 2013-08-28 ENCOUNTER — Encounter: Payer: Self-pay | Admitting: Podiatry

## 2013-08-28 VITALS — BP 138/78 | HR 80 | Resp 12 | Ht 63.0 in | Wt 265.0 lb

## 2013-08-28 DIAGNOSIS — M79609 Pain in unspecified limb: Secondary | ICD-10-CM

## 2013-08-28 DIAGNOSIS — R52 Pain, unspecified: Secondary | ICD-10-CM

## 2013-08-28 DIAGNOSIS — B351 Tinea unguium: Secondary | ICD-10-CM

## 2013-08-28 DIAGNOSIS — M7750 Other enthesopathy of unspecified foot: Secondary | ICD-10-CM

## 2013-08-28 NOTE — Progress Notes (Signed)
  Subjective:    Patient ID: Abigail Duncan, female    DOB: [birth year], 72 y.o.   MRN: [id number]  Foot Pain      Review of Systems  HENT: Positive for sinus pressure.   Endocrine: Positive for cold intolerance.  Musculoskeletal: Positive for back pain.  Hematological: Bruises/bleeds easily.  All other systems reviewed and are negative.       Objective:   Physical Exam        Assessment & Plan:

## 2013-08-28 NOTE — Progress Notes (Signed)
N-SORE, NUMBNESS SENSATION L-B/L FEET D-3 MONTHS O-SLOWLY C-SAME A-WALKING, PRESSURE T-N/A

## 2013-08-28 NOTE — Progress Notes (Signed)
Subjective:     Patient ID: Abigail Duncan, female   DOB: 10/07/1940, 72 y.o.   MRN: [id number]  Foot Pain   patient states that both my feet hurt especially at night. States it is random and she does not know when it occurs   Review of Systems  All other systems reviewed and are negative.       Objective:   Physical Exam  Nursing note and vitals reviewed. Constitutional: She is oriented to person, place, and time.  Cardiovascular: Intact distal pulses.   Musculoskeletal: Normal range of motion.  Neurological: She is oriented to person, place, and time.  Skin: Skin is warm.   patient is found to have normal neurological sensation and muscle strength currently. Patient has mild discomfort plantar feet but was difficult at this time to recreate the pain that she experiences. Is found to have quite a bit of depression of the arch both feet     Assessment:     Might be mild neuropathy versus tendinitis or structural do to foot type    Plan:     H&P and x-rays reviewed with patient. At this time advised on soaks in the evening and changing to light her more supportive shoe gear and if symptoms persist we will need to consider gabapentin or other medication reappoint to recheck as needed

## 2013-08-29 ENCOUNTER — Ambulatory Visit (INDEPENDENT_AMBULATORY_CARE_PROVIDER_SITE_OTHER): Payer: Medicare Other | Admitting: *Deleted

## 2013-08-29 DIAGNOSIS — Z7901 Long term (current) use of anticoagulants: Secondary | ICD-10-CM

## 2013-08-29 DIAGNOSIS — I4891 Unspecified atrial fibrillation: Secondary | ICD-10-CM

## 2013-08-29 LAB — POCT INR: INR: 2.1

## 2013-09-20 ENCOUNTER — Other Ambulatory Visit: Payer: Self-pay | Admitting: Family Medicine

## 2013-09-21 ENCOUNTER — Other Ambulatory Visit: Payer: Self-pay | Admitting: Family Medicine

## 2013-10-03 ENCOUNTER — Ambulatory Visit (INDEPENDENT_AMBULATORY_CARE_PROVIDER_SITE_OTHER): Payer: Medicare Other | Admitting: *Deleted

## 2013-10-03 DIAGNOSIS — I4891 Unspecified atrial fibrillation: Secondary | ICD-10-CM

## 2013-10-03 DIAGNOSIS — Z7901 Long term (current) use of anticoagulants: Secondary | ICD-10-CM

## 2013-10-23 ENCOUNTER — Encounter: Payer: Self-pay | Admitting: Family Medicine

## 2013-10-23 ENCOUNTER — Ambulatory Visit (INDEPENDENT_AMBULATORY_CARE_PROVIDER_SITE_OTHER): Payer: Medicare Other | Admitting: *Deleted

## 2013-10-23 ENCOUNTER — Ambulatory Visit (INDEPENDENT_AMBULATORY_CARE_PROVIDER_SITE_OTHER): Payer: Medicare Other | Admitting: Family Medicine

## 2013-10-23 VITALS — Ht 63.0 in | Wt 263.0 lb

## 2013-10-23 DIAGNOSIS — I4891 Unspecified atrial fibrillation: Secondary | ICD-10-CM

## 2013-10-23 DIAGNOSIS — E1142 Type 2 diabetes mellitus with diabetic polyneuropathy: Secondary | ICD-10-CM | POA: Insufficient documentation

## 2013-10-23 DIAGNOSIS — E1149 Type 2 diabetes mellitus with other diabetic neurological complication: Secondary | ICD-10-CM

## 2013-10-23 DIAGNOSIS — E119 Type 2 diabetes mellitus without complications: Secondary | ICD-10-CM

## 2013-10-23 DIAGNOSIS — M5430 Sciatica, unspecified side: Secondary | ICD-10-CM

## 2013-10-23 DIAGNOSIS — I2510 Atherosclerotic heart disease of native coronary artery without angina pectoris: Secondary | ICD-10-CM

## 2013-10-23 DIAGNOSIS — I10 Essential (primary) hypertension: Secondary | ICD-10-CM

## 2013-10-23 DIAGNOSIS — Z7901 Long term (current) use of anticoagulants: Secondary | ICD-10-CM

## 2013-10-23 HISTORY — DX: Type 2 diabetes mellitus with diabetic polyneuropathy: E11.42

## 2013-10-23 LAB — POCT GLYCOSYLATED HEMOGLOBIN (HGB A1C): Hemoglobin A1C: 6.3

## 2013-10-23 LAB — POCT INR: INR: 2.5

## 2013-10-23 MED ORDER — HYDROCODONE-ACETAMINOPHEN 7.5-325 MG PO TABS
ORAL_TABLET | ORAL | Status: DC
Start: 2013-10-23 — End: 2013-10-23

## 2013-10-23 MED ORDER — GABAPENTIN 100 MG PO CAPS: 100.0000 mg | ORAL_CAPSULE | Freq: Three times a day (TID) | ORAL | Status: DC

## 2013-10-23 MED ORDER — HYDROCODONE-ACETAMINOPHEN 7.5-325 MG PO TABS: ORAL_TABLET | ORAL | Status: DC

## 2013-10-23 NOTE — Assessment & Plan Note (Signed)
INR stable on current Coumadin regimen

## 2013-10-23 NOTE — Assessment & Plan Note (Signed)
Patient is currently stable without recent symptoms

## 2013-10-23 NOTE — Assessment & Plan Note (Signed)
Patient is currently rate controlled and asymptomatic -Continue nifedipine -Continue current dose of Coumadin as INR stable

## 2013-10-23 NOTE — Assessment & Plan Note (Signed)
Patient continues to be functional with intermittent Percocet use -Will add gabapentin in hopes of decreasing Percocet use

## 2013-10-23 NOTE — Patient Instructions (Signed)
Diabetes - check Hemoglobin A1C today, continue current dose of Metformin, Dr. Ree Kida will call you with your results, keep eye appointment in February  Hand/Foot numbness - due to diabetes, start Gabapentin 100 mg three times a day  Atrial Fibrillation - continue current regimen  High Blood Pressure - controlled today  Sciatic/Back pain - continue Percocet as needed, may have benefit from Gabapentin, call Dr. Ree Kida in one month to let him know how things are going  Heart Disease - no changes to regimen  Return to office in 3 months for yearly physical/preventative exam.

## 2013-10-23 NOTE — Assessment & Plan Note (Addendum)
Mildly elevated however within acceptable range per JNC 8 guidelines -No change to medication regimen today

## 2013-10-23 NOTE — Progress Notes (Addendum)
   Subjective:    Patient ID: Abigail Duncan, female    DOB: [birth year], 73 y.o.   MRN: [id number]  HPI 73 year old female presents for followup of multiple medical conditions.  Diabetes-patient currently on metformin, tolerating well, she does not check her blood glucoses on a regular basis, she was seen by podiatry in November of 2014, she previously had a ophthalmologist appointment however her brother passed away around that time and she had to cancel the appointment, she has rescheduled the appointment for February 2014, she does report occasional numbness in her bilateral hands and feet it is well-controlled with Percocet as needed  Low back pain/sciatica-well-controlled with Percocet every 6 hours as needed, reports occasional radiation of pain down to her feet, no weakness, no saddle anesthesia  CAD-no recent chest pain, has not needed to use her nitroglycerin  Hypertension-currently on enalapril-hydrochlorothiazide 10-25, nifedipine 90 mg daily, and enalapril 10 mg daily, no medication side effects, no chest pain, no headache, no vision changes  Atrial fibrillation on chronic anticoagulation-INR has been stable, she is currently on Coumadin 5 mg 4 days a week and 2.5 mg 3 days a week, no palpitations, no chest pain  Social-reviewed and updated, patient does report recent loss of her brother, she states that she is having a difficult time with the loss however does have a support system in place  Review of Systems  Constitutional: Negative for fever, chills and fatigue.  Respiratory: Negative for apnea, cough, choking, shortness of breath and wheezing.   Cardiovascular: Negative for chest pain, palpitations and leg swelling.  Gastrointestinal: Negative for nausea, vomiting, abdominal pain, diarrhea and abdominal distention.  Neurological: Positive for numbness. Negative for syncope and light-headedness.       Objective:   Physical Exam Vitals: Reviewed General: Pleasant  Caucasian female, no acute distress HEENT: Normocephalic, pupils are equal round and reactive to light, extraocular movements are intact, no scleral icterus, moist mucous members, uvula midline, no pharyngeal erythema or exudate noted, neck was supple, no anterior posterior cervical lymphadenopathy Cardiac: Irregular irregular rate and rhythm, no murmurs, no heaves or thrills Respiratory: Clear to auscultation bilaterally, normal effort Abdomen: Soft, nontender Extremities: Trace pedal edema, 2+ radial pulses bilaterally, 2 posterior cells pedis pulses bilaterally Skin: No rashes, bilateral foot examination were negative for foot ulceration, patient did have mild onychomycosis of the right first toe MSK: Straight leg test positive on the right leg, negative on the left Neuro: Strength was 5 of 5 bilaterally to hip flexion, knee flexion and extension, foot plantar and dorsiflexion, sensation was grossly intact to light touch       Assessment & Plan:  Please see problem specific assessment and plan

## 2013-10-23 NOTE — Assessment & Plan Note (Signed)
Patient has symptoms consistent with diabetic peripheral neuropathy -Will initiate gabapentin 100 mg 3 times a day

## 2013-11-20 ENCOUNTER — Ambulatory Visit (INDEPENDENT_AMBULATORY_CARE_PROVIDER_SITE_OTHER): Payer: Medicare Other | Admitting: *Deleted

## 2013-11-20 DIAGNOSIS — Z7901 Long term (current) use of anticoagulants: Secondary | ICD-10-CM

## 2013-11-20 DIAGNOSIS — I4891 Unspecified atrial fibrillation: Secondary | ICD-10-CM

## 2013-11-20 LAB — POCT INR: INR: 2.5

## 2013-12-25 ENCOUNTER — Ambulatory Visit: Payer: Medicare Other

## 2013-12-26 ENCOUNTER — Ambulatory Visit (INDEPENDENT_AMBULATORY_CARE_PROVIDER_SITE_OTHER): Payer: Medicare Other | Admitting: *Deleted

## 2013-12-26 DIAGNOSIS — I4891 Unspecified atrial fibrillation: Secondary | ICD-10-CM

## 2013-12-26 DIAGNOSIS — Z7901 Long term (current) use of anticoagulants: Secondary | ICD-10-CM

## 2013-12-26 LAB — POCT INR: INR: 2.6

## 2014-01-05 ENCOUNTER — Other Ambulatory Visit: Payer: Self-pay | Admitting: Family Medicine

## 2014-01-26 ENCOUNTER — Encounter: Payer: Self-pay | Admitting: Family Medicine

## 2014-01-26 ENCOUNTER — Ambulatory Visit (INDEPENDENT_AMBULATORY_CARE_PROVIDER_SITE_OTHER): Payer: Medicare Other | Admitting: *Deleted

## 2014-01-26 ENCOUNTER — Ambulatory Visit (INDEPENDENT_AMBULATORY_CARE_PROVIDER_SITE_OTHER): Payer: Medicare Other | Admitting: Family Medicine

## 2014-01-26 VITALS — BP 140/80 | HR 88 | Ht 63.0 in | Wt 266.0 lb

## 2014-01-26 DIAGNOSIS — Z23 Encounter for immunization: Secondary | ICD-10-CM

## 2014-01-26 DIAGNOSIS — I4891 Unspecified atrial fibrillation: Secondary | ICD-10-CM

## 2014-01-26 DIAGNOSIS — I2510 Atherosclerotic heart disease of native coronary artery without angina pectoris: Secondary | ICD-10-CM

## 2014-01-26 DIAGNOSIS — I10 Essential (primary) hypertension: Secondary | ICD-10-CM

## 2014-01-26 DIAGNOSIS — M5430 Sciatica, unspecified side: Secondary | ICD-10-CM

## 2014-01-26 DIAGNOSIS — E2839 Other primary ovarian failure: Secondary | ICD-10-CM

## 2014-01-26 DIAGNOSIS — Z0000 Encounter for general adult medical examination without abnormal findings: Secondary | ICD-10-CM

## 2014-01-26 DIAGNOSIS — Z7901 Long term (current) use of anticoagulants: Secondary | ICD-10-CM

## 2014-01-26 LAB — CBC
HCT: 38.7 % (ref 36.0–46.0)
Hemoglobin: 13.4 g/dL (ref 12.0–15.0)
MCH: 29.6 pg (ref 26.0–34.0)
MCHC: 34.6 g/dL (ref 30.0–36.0)
MCV: 85.4 fL (ref 78.0–100.0)
PLATELETS: 366 10*3/uL (ref 150–400)
RBC: 4.53 MIL/uL (ref 3.87–5.11)
RDW: 14.9 % (ref 11.5–15.5)
WBC: 11.3 10*3/uL — AB (ref 4.0–10.5)

## 2014-01-26 LAB — POCT INR: INR: 2.2

## 2014-01-26 MED ORDER — METFORMIN HCL 500 MG PO TABS: 500.0000 mg | ORAL_TABLET | Freq: Two times a day (BID) | ORAL | Status: DC

## 2014-01-26 MED ORDER — HYDROCODONE-ACETAMINOPHEN 7.5-325 MG PO TABS: ORAL_TABLET | ORAL | Status: DC

## 2014-01-26 MED ORDER — GABAPENTIN 100 MG PO CAPS: 100.0000 mg | ORAL_CAPSULE | Freq: Three times a day (TID) | ORAL | Status: DC

## 2014-01-26 MED ORDER — HYDROCODONE-ACETAMINOPHEN 7.5-325 MG PO TABS
ORAL_TABLET | ORAL | Status: DC
Start: 2014-01-26 — End: 2014-04-17

## 2014-01-26 NOTE — Patient Instructions (Addendum)
Thank you for coming in today for your annual well visit.   Immunizations - you are up to date on shots today after receiving your pneumonia shot, you are also eligible for a tetanus shot however since it would cost you $65 out of pocket we held off on this today.  Cancer screening - please have your mammogram completed in May, you are up to date on you Colonoscopy  Rudene Re will call you with your appointment time for the Dexa scan (test for bone density)  Refills have been sent to your pharmacy. Dr. Ree Kida will call you with your lab results.  Please return in 3 months to follow up on diabetes and high blood pressure.

## 2014-01-27 LAB — BASIC METABOLIC PANEL
BUN: 27 mg/dL — ABNORMAL HIGH (ref 6–23)
CHLORIDE: 100 meq/L (ref 96–112)
CO2: 22 mEq/L (ref 19–32)
CREATININE: 1.11 mg/dL — AB (ref 0.50–1.10)
Calcium: 9 mg/dL (ref 8.4–10.5)
Glucose, Bld: 114 mg/dL — ABNORMAL HIGH (ref 70–99)
Potassium: 4.3 mEq/L (ref 3.5–5.3)
Sodium: 137 mEq/L (ref 135–145)

## 2014-01-27 NOTE — Assessment & Plan Note (Signed)
Controlled per JNC 8 guidelines

## 2014-01-27 NOTE — Progress Notes (Signed)
Patient ID: Abigail Duncan, female   DOB: July 23, 1941, 73 y.o.   MRN: [id number]  73 y.o. year old female presents for well woman/preventative visit and annual GYN examination.  Acute Concerns: No acute concerns identified. Needs refills of Norco which she takes for chronic hip and back pain  DPOA: Patient has a living will, her sons Abigail Duncan and Abigail Duncan are listed as DPOA.   Reviewed Medications and updated in EPIC.   Social:  History   Social History  . Marital Status: Widowed    Spouse Name: N/A    Number of Children: 2  . Years of Education: N/A   Occupational History  . [employer]/support/administrative officer     Retired   Social History Main Topics  . Smoking status: Never Smoker   . Smokeless tobacco: Never Used  . Alcohol Use: No  . Drug Use: No  . Sexual Activity: None   Other Topics Concern  . None   Social History Narrative   Husband died of lung cancer   Disabled son, Abigail Duncan, lives with her in [place], [place], lives in town, had severe auto accident a few years ago    Immunization:  Tdap/TD: 2005  Influenza: 2014  Pneumococcal: 2000, 2011 (both 23 valent)  Herpes Zoster: 2012  Cancer Screening:  Pap Smear: patient in uncertain of last Pap Smear  Mammogram: Many years ago, no breast issues, no breast lumps, no nipple drainage  Colonoscopy: 2013, patient reports no polyps/abnormalities  Dexa: She has never had a Dexa scan  ROS: no chest pain, no sob, no nausea, no abdominal pain, no emesis, no diarrhea, no constipation, chronic hip pain, pain currently 7/10,  Physical Exam: VITALS: reviewed GEN: pleasant obese female, NAD HEENT: normocephalic, PERRL, EOMI, bilateral TM pearly grey, nasal septum midline, uvula midline, MMM, no pharyngeal erythema or exudate noted, neck supple, no cervical lymphadenopathy CARDIAC: Irregular Irregular rate and rythym, S1 and S2 present, no murmurs, no heaves/thrills RESP:CTAB, normal effort BREAST:Exam performed in the presence of  a chaperone. No abnormal lesions noted, no nipple discharge, no axillary lymphadenopathy ABD: obese, soft, normal bowel sounds, no tenderness, no rebound, no guarding GU/GYN:Exam performed in the presence of a chaperone. Normal external vulvar anatomy, speculum exam not performed, bimanual attempted however limited due to patient no tolerating positioning/obesity, no gross mass or vaginal discharge identified EXT: trace edema bilaterally, 2+ radial pulses bilaterally SKIN: multiple SK lesions on trunk, no abnormal/suspicious lesions identified  ASSESSMENT & PLAN: 73 y.o. female presents for annual well woman/preventative exam and GYN exam. Please see problem specific assessment and plan.

## 2014-01-27 NOTE — Assessment & Plan Note (Signed)
Patient is a 73 year old female who presents for annual well visit.  -She is a candidate for the 13 valent pneumococcal vaccine (provided) and Tdap (not given as patient refused due to out of pocket cost). She is otherwise up to date on immunizations. -In regards to cancer screening she is due for mammogram (scheduled next month), she is up to date on her colon cancer screening, she is no longer a candidate for Pap Smear/Cervical Cancer screening as she is over the age of 25 -She is a candidate for DEXA scan as she is postmenopausal/estrogen deficient, nursing staff to schedule -Due for screening lab work including CBC and BMP (up to date on TSH and Lipid Panel) -She has a DPOA/Living will

## 2014-01-27 NOTE — Assessment & Plan Note (Signed)
Stable. -3 month refill of chronic pain medications provided

## 2014-02-02 ENCOUNTER — Other Ambulatory Visit: Payer: Self-pay | Admitting: Family Medicine

## 2014-02-20 ENCOUNTER — Ambulatory Visit (INDEPENDENT_AMBULATORY_CARE_PROVIDER_SITE_OTHER): Payer: Medicare Other | Admitting: *Deleted

## 2014-02-20 DIAGNOSIS — Z7901 Long term (current) use of anticoagulants: Secondary | ICD-10-CM

## 2014-02-20 DIAGNOSIS — I4891 Unspecified atrial fibrillation: Secondary | ICD-10-CM

## 2014-02-20 LAB — POCT INR: INR: 1.9

## 2014-03-10 ENCOUNTER — Other Ambulatory Visit: Payer: Self-pay | Admitting: Family Medicine

## 2014-03-12 ENCOUNTER — Other Ambulatory Visit: Payer: Self-pay | Admitting: Family Medicine

## 2014-03-20 ENCOUNTER — Ambulatory Visit: Payer: Medicare Other

## 2014-03-21 ENCOUNTER — Ambulatory Visit (INDEPENDENT_AMBULATORY_CARE_PROVIDER_SITE_OTHER): Payer: Medicare Other | Admitting: *Deleted

## 2014-03-21 DIAGNOSIS — Z7901 Long term (current) use of anticoagulants: Secondary | ICD-10-CM

## 2014-03-21 DIAGNOSIS — I4891 Unspecified atrial fibrillation: Secondary | ICD-10-CM

## 2014-03-21 LAB — POCT INR: INR: 2.2

## 2014-04-10 ENCOUNTER — Other Ambulatory Visit: Payer: Self-pay | Admitting: Family Medicine

## 2014-04-17 ENCOUNTER — Telehealth: Payer: Self-pay | Admitting: Family Medicine

## 2014-04-17 ENCOUNTER — Ambulatory Visit (INDEPENDENT_AMBULATORY_CARE_PROVIDER_SITE_OTHER): Payer: Medicare Other | Admitting: *Deleted

## 2014-04-17 ENCOUNTER — Encounter: Payer: Self-pay | Admitting: Family Medicine

## 2014-04-17 ENCOUNTER — Ambulatory Visit (INDEPENDENT_AMBULATORY_CARE_PROVIDER_SITE_OTHER): Payer: Medicare Other | Admitting: Family Medicine

## 2014-04-17 VITALS — BP 136/77 | HR 88 | Temp 97.9°F | Ht 63.0 in | Wt 268.0 lb

## 2014-04-17 DIAGNOSIS — I10 Essential (primary) hypertension: Secondary | ICD-10-CM

## 2014-04-17 DIAGNOSIS — I4891 Unspecified atrial fibrillation: Secondary | ICD-10-CM

## 2014-04-17 DIAGNOSIS — E119 Type 2 diabetes mellitus without complications: Secondary | ICD-10-CM

## 2014-04-17 DIAGNOSIS — M5136 Other intervertebral disc degeneration, lumbar region: Secondary | ICD-10-CM

## 2014-04-17 DIAGNOSIS — M5137 Other intervertebral disc degeneration, lumbosacral region: Secondary | ICD-10-CM

## 2014-04-17 DIAGNOSIS — Z7901 Long term (current) use of anticoagulants: Secondary | ICD-10-CM

## 2014-04-17 DIAGNOSIS — E1142 Type 2 diabetes mellitus with diabetic polyneuropathy: Secondary | ICD-10-CM

## 2014-04-17 DIAGNOSIS — E1149 Type 2 diabetes mellitus with other diabetic neurological complication: Secondary | ICD-10-CM

## 2014-04-17 LAB — POCT INR: INR: 2.6

## 2014-04-17 LAB — POCT GLYCOSYLATED HEMOGLOBIN (HGB A1C): Hemoglobin A1C: 6.4

## 2014-04-17 MED ORDER — HYDROCODONE-ACETAMINOPHEN 5-325 MG PO TABS: 1.0000 | ORAL_TABLET | Freq: Four times a day (QID) | ORAL | Status: DC | PRN

## 2014-04-17 NOTE — Telephone Encounter (Signed)
Discussed A1C with patient.

## 2014-04-17 NOTE — Patient Instructions (Signed)
Diabetes - continue Metformin, Dr. Ree Kida will call you with your A1C results  Pain - refill of vicodin provided, as this is a lower dose please call Dr. Ree Kida in one month to let him know how your pain is  High Blood Pressure - controlled today, continue current medications  Return in 3 months.

## 2014-04-17 NOTE — Assessment & Plan Note (Signed)
Stable on Vicodin - after discussion with patient hydrocodone/acetaminophin decreased to 5-325 mg Q 6 PRN -one month supply given -Patient to call in if pain is not controlled

## 2014-04-17 NOTE — Progress Notes (Signed)
   Subjective:    Patient ID: Abigail Duncan, female    DOB: 09/22/1941, 73 y.o.   MRN: [id number]  HPI 73 y/o female presents for routine follow up.  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  Respiratory: Negative for chest tightness and shortness of breath.   Cardiovascular: Positive for leg swelling. Negative for chest pain.  Gastrointestinal: Negative for nausea, vomiting and diarrhea.       Objective:   Physical Exam Vitals: reviewed Gen: pleasant Caucasian female, NAD Cardiac: Irregular Irregular rhythm, no murmurs, no heaves/thrills Resp: CTAB, normal effort MSK: bilateral lumbar paraspinal tenderness Neuro: strength 5/5 bilaterally to hip flexion, knee flexion/extension, dorsi/plantarflexion of feet, sensation grossly intact to light touch  MRI 03/2004 IMPRESSION  1. L5-S1 moderate to large size central protrusion with mass effect upon the S1 nerve roots, as noted above.  2. L4-5 multifactorial mild spinal stenosis.  3. T12-L1 small broad-based subligamentous disk protrusion with mild impression upon the thecal sac without cord deformity. Axial images through this level are not obtained. Please see above.     Assessment & Plan:  Please see problem specific assessment and plan

## 2014-04-17 NOTE — Assessment & Plan Note (Signed)
Stable on Metformin -check A1C today

## 2014-04-17 NOTE — Assessment & Plan Note (Signed)
Controlled on current regimen.   

## 2014-04-17 NOTE — Assessment & Plan Note (Signed)
Peripheral Neuropathy controlled with 100 mg Gabapentin TID

## 2014-05-15 ENCOUNTER — Ambulatory Visit (INDEPENDENT_AMBULATORY_CARE_PROVIDER_SITE_OTHER): Payer: Medicare Other | Admitting: *Deleted

## 2014-05-15 DIAGNOSIS — Z7901 Long term (current) use of anticoagulants: Secondary | ICD-10-CM

## 2014-05-15 DIAGNOSIS — I4891 Unspecified atrial fibrillation: Secondary | ICD-10-CM

## 2014-05-15 LAB — POCT INR: INR: 2.2

## 2014-05-18 ENCOUNTER — Telehealth: Payer: Self-pay | Admitting: Family Medicine

## 2014-05-18 DIAGNOSIS — I2510 Atherosclerotic heart disease of native coronary artery without angina pectoris: Secondary | ICD-10-CM

## 2014-05-18 MED ORDER — HYDROCODONE-ACETAMINOPHEN 5-325 MG PO TABS: 1.0000 | ORAL_TABLET | Freq: Four times a day (QID) | ORAL | Status: DC | PRN

## 2014-05-18 MED ORDER — GABAPENTIN 100 MG PO CAPS: ORAL_CAPSULE | ORAL | Status: DC

## 2014-05-18 NOTE — Telephone Encounter (Signed)
Patient reports increased pain in low back with sciatica down right leg on lower dose of Norco (currently on 5-325, previously on 7.5-325 mg), she did notice improvement since January with Gabapentin (still on low dose 100 mg TID), will increase Gabapentin to 200 mg TID (she can increase to 300 TID if tolerates well after 5 days), will refill same dose of Norco for one month, patient to call in one month to let me know how pain is and for refill of medication.

## 2014-05-18 NOTE — Telephone Encounter (Signed)
Pt states new pain meds do not work well. Pt is wanting to switch back to old pain meds. Please call pt for pick up.

## 2014-06-19 ENCOUNTER — Ambulatory Visit: Payer: Medicare Other

## 2014-06-21 ENCOUNTER — Telehealth: Payer: Self-pay | Admitting: Family Medicine

## 2014-06-21 ENCOUNTER — Ambulatory Visit (INDEPENDENT_AMBULATORY_CARE_PROVIDER_SITE_OTHER): Payer: Medicare Other | Admitting: *Deleted

## 2014-06-21 DIAGNOSIS — I4891 Unspecified atrial fibrillation: Secondary | ICD-10-CM

## 2014-06-21 DIAGNOSIS — Z7901 Long term (current) use of anticoagulants: Secondary | ICD-10-CM

## 2014-06-21 LAB — POCT INR: INR: 1.7

## 2014-06-21 NOTE — Telephone Encounter (Signed)
Refill request for Hydrocodone. Call for pick up.

## 2014-06-22 MED ORDER — HYDROCODONE-ACETAMINOPHEN 5-325 MG PO TABS: 1.0000 | ORAL_TABLET | Freq: Four times a day (QID) | ORAL | Status: DC | PRN

## 2014-06-22 NOTE — Telephone Encounter (Signed)
Printed scripts (3 one month supplies), please call patient to pick up, placed in front office

## 2014-06-22 NOTE — Telephone Encounter (Signed)
Patient informed. 

## 2014-06-30 ENCOUNTER — Other Ambulatory Visit: Payer: Self-pay | Admitting: Family Medicine

## 2014-07-05 ENCOUNTER — Ambulatory Visit (INDEPENDENT_AMBULATORY_CARE_PROVIDER_SITE_OTHER): Payer: Medicare Other | Admitting: *Deleted

## 2014-07-05 DIAGNOSIS — Z7901 Long term (current) use of anticoagulants: Secondary | ICD-10-CM

## 2014-07-05 DIAGNOSIS — I4891 Unspecified atrial fibrillation: Secondary | ICD-10-CM

## 2014-07-05 LAB — POCT INR: INR: 1.9

## 2014-07-18 ENCOUNTER — Other Ambulatory Visit: Payer: Self-pay | Admitting: Family Medicine

## 2014-07-22 ENCOUNTER — Other Ambulatory Visit: Payer: Self-pay | Admitting: Family Medicine

## 2014-07-23 ENCOUNTER — Ambulatory Visit (INDEPENDENT_AMBULATORY_CARE_PROVIDER_SITE_OTHER): Payer: Medicare Other | Admitting: *Deleted

## 2014-07-23 ENCOUNTER — Ambulatory Visit (INDEPENDENT_AMBULATORY_CARE_PROVIDER_SITE_OTHER): Payer: Medicare Other | Admitting: Family Medicine

## 2014-07-23 ENCOUNTER — Encounter: Payer: Self-pay | Admitting: Family Medicine

## 2014-07-23 VITALS — BP 129/69 | HR 79 | Temp 98.1°F | Ht 63.0 in | Wt 268.8 lb

## 2014-07-23 DIAGNOSIS — E119 Type 2 diabetes mellitus without complications: Secondary | ICD-10-CM

## 2014-07-23 DIAGNOSIS — G629 Polyneuropathy, unspecified: Secondary | ICD-10-CM

## 2014-07-23 DIAGNOSIS — E1342 Other specified diabetes mellitus with diabetic polyneuropathy: Secondary | ICD-10-CM

## 2014-07-23 DIAGNOSIS — M5136 Other intervertebral disc degeneration, lumbar region: Secondary | ICD-10-CM

## 2014-07-23 DIAGNOSIS — Z7901 Long term (current) use of anticoagulants: Secondary | ICD-10-CM

## 2014-07-23 DIAGNOSIS — M25561 Pain in right knee: Secondary | ICD-10-CM

## 2014-07-23 DIAGNOSIS — I10 Essential (primary) hypertension: Secondary | ICD-10-CM

## 2014-07-23 DIAGNOSIS — I4891 Unspecified atrial fibrillation: Secondary | ICD-10-CM

## 2014-07-23 DIAGNOSIS — E1142 Type 2 diabetes mellitus with diabetic polyneuropathy: Secondary | ICD-10-CM

## 2014-07-23 HISTORY — DX: Pain in right knee: M25.561

## 2014-07-23 LAB — BASIC METABOLIC PANEL
BUN: 31 mg/dL — ABNORMAL HIGH (ref 6–23)
CO2: 22 meq/L (ref 19–32)
Calcium: 9.1 mg/dL (ref 8.4–10.5)
Chloride: 100 mEq/L (ref 96–112)
Creat: 1.29 mg/dL — ABNORMAL HIGH (ref 0.50–1.10)
GLUCOSE: 129 mg/dL — AB (ref 70–99)
POTASSIUM: 4.7 meq/L (ref 3.5–5.3)
SODIUM: 136 meq/L (ref 135–145)

## 2014-07-23 LAB — POCT GLYCOSYLATED HEMOGLOBIN (HGB A1C): Hemoglobin A1C: 6.2

## 2014-07-23 LAB — POCT INR: INR: 2.4

## 2014-07-23 MED ORDER — ROSUVASTATIN CALCIUM 20 MG PO TABS: 10.0000 mg | ORAL_TABLET | Freq: Every day | ORAL | Status: DC

## 2014-07-23 MED ORDER — HYDROCODONE-ACETAMINOPHEN 5-325 MG PO TABS: 1.0000 | ORAL_TABLET | Freq: Four times a day (QID) | ORAL | Status: DC | PRN

## 2014-07-23 MED ORDER — GABAPENTIN 100 MG PO CAPS: 100.0000 mg | ORAL_CAPSULE | Freq: Three times a day (TID) | ORAL | Status: DC

## 2014-07-23 MED ORDER — NITROGLYCERIN 0.4 MG SL SUBL: 0.4000 mg | SUBLINGUAL_TABLET | SUBLINGUAL | Status: DC | PRN

## 2014-07-23 MED ORDER — METFORMIN HCL 500 MG PO TABS: 500.0000 mg | ORAL_TABLET | Freq: Two times a day (BID) | ORAL | Status: DC

## 2014-07-23 NOTE — Patient Instructions (Signed)
Dr. Ree Kida will call you with your results.  Please go to the hospital to have your xray of the knee done.   Refills provided for pain medication. Return in 3 months for refills.

## 2014-07-23 NOTE — Assessment & Plan Note (Signed)
Stable on Gabapentin 100 mg TID. Unable to tolerate increased dose.

## 2014-07-23 NOTE — Assessment & Plan Note (Signed)
Pain stable on Norco 5-325. Patient unable to tolerate increased dose of Gabapentin 200 mg TID. -refill for pain medication provided -continue Gabapentin 100 TID

## 2014-07-23 NOTE — Assessment & Plan Note (Signed)
Blood pressure controlled on current regimen.  -check BMP to monitor renal function

## 2014-07-23 NOTE — Assessment & Plan Note (Signed)
Type 2 diabetes controlled on Metformin 500 mg BID, up to date on foot and eye exams. -continue current management

## 2014-07-23 NOTE — Progress Notes (Signed)
   Subjective:    Patient ID: Abigail Duncan, female    DOB: 06-12-1941, 73 y.o.   MRN: [id number]  HPI 74 y/o female presents for routine follow up.   Diabetes - currently on metformin 500 mg BID, tolerating well, no side effects, no regular exercise, does not check sugars regularly, up to date on eye and foot exams  Diabetic neuropathy - was unable to tolerate Gabapentin 200 mg TID, has dizziness with this dose, she is tolerating 100 mg TID, neuropathy is managable  Chronic pain - related to low back pain, right hip pain, right knee pain-patient reports previous steroid injections in right knee, reports occasional giving out, no locking, uses cane for help with ambulation, no associated bladder or bowel incontinence  Social - never a smoker   Review of Systems  Constitutional: Negative for fever, chills and fatigue.  Respiratory: Negative for cough and shortness of breath.   Cardiovascular: Negative for chest pain.  Gastrointestinal: Negative for nausea, vomiting and diarrhea.       Objective:   Physical Exam Vitals: reviewed Gen: pleasant female, NAD Cardiac: irregular irregular rate and rhythym, S1 and S2 present, no murmurs, no heaves/thrills Resp: CTAB, normal effort Ext: no edema Foot Exam - no ulcerations, intact sensation no monofilament MSK: right knee - joint line tenderness, no swelling, no crepitus, patient unable to tolerate positioning for total knee exam, no clear ligament laxity with varus/valgus stress testing, no pain with Mcmurray testing; slump test negative for radiculopathy on right side  Xray lumbar spine in 2005 showed lumbar DDD No recent right knee xray POC A1C 6.2     Assessment & Plan:  Please see problem specific assessment and plan.

## 2014-07-23 NOTE — Assessment & Plan Note (Signed)
Patient presents with chronic right knee pain. No previous imaging present in records. Suspect DJD. -xray of eri

## 2014-07-24 ENCOUNTER — Encounter: Payer: Self-pay | Admitting: Family Medicine

## 2014-07-26 ENCOUNTER — Other Ambulatory Visit: Payer: Self-pay | Admitting: Family Medicine

## 2014-08-10 ENCOUNTER — Other Ambulatory Visit: Payer: Self-pay | Admitting: *Deleted

## 2014-08-13 ENCOUNTER — Other Ambulatory Visit: Payer: Self-pay | Admitting: *Deleted

## 2014-08-13 MED ORDER — ROSUVASTATIN CALCIUM 20 MG PO TABS: 10.0000 mg | ORAL_TABLET | Freq: Every day | ORAL | Status: DC

## 2014-08-14 ENCOUNTER — Other Ambulatory Visit: Payer: Self-pay | Admitting: Family Medicine

## 2014-08-14 MED ORDER — ROSUVASTATIN CALCIUM 20 MG PO TABS: 10.0000 mg | ORAL_TABLET | Freq: Every day | ORAL | Status: DC

## 2014-08-14 NOTE — Telephone Encounter (Signed)
Received another refill request for crestor.  Refilled was approved on 08/13/2014 but stated no print.  Refill resent to pharmacy.  Derl Barrow, RN

## 2014-08-14 NOTE — Addendum Note (Signed)
Addended by: Derl Barrow on: 08/14/2014 03:51 PM   Modules accepted: Orders

## 2014-08-21 ENCOUNTER — Ambulatory Visit: Payer: Medicare Other

## 2014-08-22 ENCOUNTER — Ambulatory Visit (INDEPENDENT_AMBULATORY_CARE_PROVIDER_SITE_OTHER): Payer: Medicare Other | Admitting: *Deleted

## 2014-08-22 DIAGNOSIS — Z7901 Long term (current) use of anticoagulants: Secondary | ICD-10-CM

## 2014-08-22 DIAGNOSIS — I4891 Unspecified atrial fibrillation: Secondary | ICD-10-CM

## 2014-08-22 LAB — POCT INR: INR: 1.9

## 2014-08-26 ENCOUNTER — Other Ambulatory Visit: Payer: Self-pay | Admitting: Family Medicine

## 2014-08-28 ENCOUNTER — Other Ambulatory Visit: Payer: Self-pay | Admitting: Family Medicine

## 2014-09-19 ENCOUNTER — Ambulatory Visit (INDEPENDENT_AMBULATORY_CARE_PROVIDER_SITE_OTHER): Payer: Medicare Other | Admitting: *Deleted

## 2014-09-19 DIAGNOSIS — I4891 Unspecified atrial fibrillation: Secondary | ICD-10-CM

## 2014-09-19 DIAGNOSIS — Z7901 Long term (current) use of anticoagulants: Secondary | ICD-10-CM

## 2014-09-19 LAB — POCT INR: INR: 2.4

## 2014-10-22 ENCOUNTER — Other Ambulatory Visit: Payer: Self-pay | Admitting: Family Medicine

## 2014-10-23 ENCOUNTER — Ambulatory Visit (INDEPENDENT_AMBULATORY_CARE_PROVIDER_SITE_OTHER): Payer: 59 | Admitting: *Deleted

## 2014-10-23 DIAGNOSIS — I4891 Unspecified atrial fibrillation: Secondary | ICD-10-CM

## 2014-10-23 DIAGNOSIS — Z7901 Long term (current) use of anticoagulants: Secondary | ICD-10-CM | POA: Diagnosis not present

## 2014-10-23 LAB — POCT INR: INR: 2.5

## 2014-11-20 ENCOUNTER — Ambulatory Visit: Payer: 59

## 2014-11-27 ENCOUNTER — Ambulatory Visit: Payer: 59 | Admitting: Family Medicine

## 2014-11-27 ENCOUNTER — Ambulatory Visit: Payer: 59

## 2014-12-18 ENCOUNTER — Ambulatory Visit (INDEPENDENT_AMBULATORY_CARE_PROVIDER_SITE_OTHER): Payer: 59 | Admitting: Family Medicine

## 2014-12-18 ENCOUNTER — Ambulatory Visit (INDEPENDENT_AMBULATORY_CARE_PROVIDER_SITE_OTHER): Payer: 59 | Admitting: *Deleted

## 2014-12-18 ENCOUNTER — Encounter: Payer: Self-pay | Admitting: Family Medicine

## 2014-12-18 VITALS — BP 108/75 | HR 82 | Temp 97.9°F | Ht 63.0 in | Wt 274.3 lb

## 2014-12-18 DIAGNOSIS — G8929 Other chronic pain: Secondary | ICD-10-CM

## 2014-12-18 DIAGNOSIS — E119 Type 2 diabetes mellitus without complications: Secondary | ICD-10-CM

## 2014-12-18 DIAGNOSIS — I4891 Unspecified atrial fibrillation: Secondary | ICD-10-CM | POA: Diagnosis not present

## 2014-12-18 DIAGNOSIS — G629 Polyneuropathy, unspecified: Secondary | ICD-10-CM

## 2014-12-18 DIAGNOSIS — M5136 Other intervertebral disc degeneration, lumbar region: Secondary | ICD-10-CM

## 2014-12-18 DIAGNOSIS — Z7189 Other specified counseling: Secondary | ICD-10-CM

## 2014-12-18 DIAGNOSIS — E1142 Type 2 diabetes mellitus with diabetic polyneuropathy: Secondary | ICD-10-CM

## 2014-12-18 DIAGNOSIS — E1342 Other specified diabetes mellitus with diabetic polyneuropathy: Secondary | ICD-10-CM

## 2014-12-18 DIAGNOSIS — I10 Essential (primary) hypertension: Secondary | ICD-10-CM

## 2014-12-18 DIAGNOSIS — Z7901 Long term (current) use of anticoagulants: Secondary | ICD-10-CM | POA: Diagnosis not present

## 2014-12-18 DIAGNOSIS — Z23 Encounter for immunization: Secondary | ICD-10-CM | POA: Diagnosis not present

## 2014-12-18 LAB — CBC
HEMATOCRIT: 40.1 % (ref 36.0–46.0)
HEMOGLOBIN: 13.3 g/dL (ref 12.0–15.0)
MCH: 29.6 pg (ref 26.0–34.0)
MCHC: 33.2 g/dL (ref 30.0–36.0)
MCV: 89.1 fL (ref 78.0–100.0)
MPV: 10 fL (ref 8.6–12.4)
Platelets: 348 10*3/uL (ref 150–400)
RBC: 4.5 MIL/uL (ref 3.87–5.11)
RDW: 15.1 % (ref 11.5–15.5)
WBC: 11 10*3/uL — ABNORMAL HIGH (ref 4.0–10.5)

## 2014-12-18 LAB — LIPID PANEL
CHOLESTEROL: 150 mg/dL (ref 0–200)
HDL: 37 mg/dL — ABNORMAL LOW (ref >=46)
LDL Cholesterol: 61 mg/dL (ref 0–99)
Total CHOL/HDL Ratio: 4.1 Ratio
Triglycerides: 261 mg/dL — ABNORMAL HIGH (ref <150)
VLDL: 52 mg/dL — ABNORMAL HIGH (ref 0–40)

## 2014-12-18 LAB — BASIC METABOLIC PANEL
BUN: 22 mg/dL (ref 6–23)
CO2: 22 meq/L (ref 19–32)
Calcium: 9 mg/dL (ref 8.4–10.5)
Chloride: 102 mEq/L (ref 96–112)
Creat: 1.2 mg/dL — ABNORMAL HIGH (ref 0.50–1.10)
GLUCOSE: 120 mg/dL — AB (ref 70–99)
POTASSIUM: 4.3 meq/L (ref 3.5–5.3)
Sodium: 138 mEq/L (ref 135–145)

## 2014-12-18 LAB — POCT GLYCOSYLATED HEMOGLOBIN (HGB A1C): HEMOGLOBIN A1C: 6.2

## 2014-12-18 LAB — POCT INR: INR: 2.1

## 2014-12-18 MED ORDER — HYDROCODONE-ACETAMINOPHEN 5-325 MG PO TABS: 1.0000 | ORAL_TABLET | Freq: Four times a day (QID) | ORAL | Status: DC | PRN

## 2014-12-18 NOTE — Patient Instructions (Signed)
Blood Pressure well controlled.  Diabetes well controlled. Please go visit your eye doctor in April.  Tetanus vaccine given today.  Check Lab work. Dr. Ree Kida will call you with the results.

## 2014-12-18 NOTE — Assessment & Plan Note (Signed)
Controlled on metformin. A1c 6.2 today -No changes in regimen -Foot examination unremarkable -Counseled to return to ophthalmologist in April -As patient is stable recheck A1c in 6 months

## 2014-12-18 NOTE — Assessment & Plan Note (Signed)
Controlled on gabapentin 200 mg 3 times a day.

## 2014-12-18 NOTE — Assessment & Plan Note (Signed)
Symptoms are stable. Refill for Norco provided. No current radicular symptoms. -See encounter for chronic pain management

## 2014-12-18 NOTE — Assessment & Plan Note (Signed)
Patient evaluated for chronic pain management. Symptoms are well controlled on current regimen. -see overview section for up-to-date UDS, pain contract, and review of narcotic database

## 2014-12-18 NOTE — Assessment & Plan Note (Signed)
Rate controlled. INR therapeutic. -Continue to monitor

## 2014-12-18 NOTE — Assessment & Plan Note (Signed)
Controlled on current regimen -No changes in pharmacotherapy today

## 2014-12-18 NOTE — Progress Notes (Addendum)
   Subjective:    Patient ID: Abigail Duncan, female    DOB: 1941/09/11, 74 y.o.   MRN: [id number]  HPI 74 year old female presents for routine follow-up.  Non-insulin-dependent type 2 diabetes-patient currently on metformin 500 mg twice daily, reports compliance, no side effects, last eye exam was completed in April 2015, no further vision changes  Peripheral neuropathy-patient currently on gabapentin 200 mg 3 times a day, reports improvement of symptoms, no current skin breakdown on her feet  Hypertension-patiently currently on enalapril-HCTZ 10-25 milligrams daily, enalapril 10 mg daily, Lopressor 100 mg twice daily, and Procardia daily, no chest pain, no headaches, no vision changes  Hyperlipidemia-patient currently on Crestor 10 mg daily, no further muscle aches  Atrial fibrillation-patient currently on Coumadin, INR checked today, no palpitations, no chest pain, no bleeding issues  Lumbar degenerative disc disease - patient on Norco 3 times daily as needed, she states that she has days where she does not need any pain medication, occasional radiculopathy bilaterally, no current bladder or bowel incontinence, no lower extremity weakness, patient has been out of Norco for 4-5 days, has been taking Tylenol prn   Review of Systems  Constitutional: Negative for fever, chills and fatigue.  Respiratory: Negative for cough and shortness of breath.   Cardiovascular: Negative for chest pain and leg swelling.       Objective:   Physical Exam Vitals: Reviewed  General: Pleasant Caucasian female, no acute distress HEENT: Normocephalic, pupils are equal round and reactive to light, extraocular motor intact, no scleral icterus, moist mucous members, uvula midline, neck was supple, no thyromegaly Cardiac: Irregular irregular rate and rhythm, S1 and S2 present, no murmurs Respiratory: Clear to station bilaterally, normal effort Abdomen: Soft, nontender, bowel sounds present Extremities: Trace  edema Foot examination: Pulses intact, no skin breakdown  Reviewed lab work for past 12 months    Assessment & Plan:  Please see problem specific assessment and plan.

## 2014-12-19 LAB — DRUG SCR UR, PAIN MGMT, REFLEX CONF
Amphetamine Screen, Ur: NEGATIVE
Barbiturate Quant, Ur: NEGATIVE
Benzodiazepines.: NEGATIVE
CREATININE, U: 176.85 mg/dL
Cocaine Metabolites: NEGATIVE
Marijuana Metabolite: NEGATIVE
Methadone: NEGATIVE
Opiates: NEGATIVE
PHENCYCLIDINE (PCP): NEGATIVE
PROPOXYPHENE: NEGATIVE

## 2014-12-22 LAB — OPIATES/OPIOIDS (LC/MS-MS)
CODEINE URINE: NEGATIVE ng/mL (ref <50)
HYDROCODONE: NEGATIVE ng/mL (ref <50)
Hydromorphone: NEGATIVE ng/mL (ref <50)
Morphine Urine: NEGATIVE ng/mL (ref <50)
NORHYDROCODONE, UR: NEGATIVE ng/mL (ref <50)
NOROXYCODONE, UR: NEGATIVE ng/mL (ref <50)
OXYCODONE, UR: NEGATIVE ng/mL (ref <50)
OXYMORPHONE, URINE: NEGATIVE ng/mL (ref <50)

## 2014-12-27 ENCOUNTER — Other Ambulatory Visit: Payer: Self-pay | Admitting: Family Medicine

## 2015-01-17 ENCOUNTER — Other Ambulatory Visit: Payer: Self-pay | Admitting: Family Medicine

## 2015-01-19 ENCOUNTER — Other Ambulatory Visit: Payer: Self-pay | Admitting: Family Medicine

## 2015-01-22 ENCOUNTER — Ambulatory Visit: Payer: 59

## 2015-01-28 ENCOUNTER — Ambulatory Visit (INDEPENDENT_AMBULATORY_CARE_PROVIDER_SITE_OTHER): Payer: Medicare Other | Admitting: *Deleted

## 2015-01-28 DIAGNOSIS — I4891 Unspecified atrial fibrillation: Secondary | ICD-10-CM

## 2015-01-28 DIAGNOSIS — Z7901 Long term (current) use of anticoagulants: Secondary | ICD-10-CM

## 2015-01-28 LAB — POCT INR: INR: 2.5

## 2015-03-03 ENCOUNTER — Other Ambulatory Visit: Payer: Self-pay | Admitting: Family Medicine

## 2015-03-05 ENCOUNTER — Other Ambulatory Visit: Payer: Self-pay | Admitting: Family Medicine

## 2015-03-06 ENCOUNTER — Ambulatory Visit (INDEPENDENT_AMBULATORY_CARE_PROVIDER_SITE_OTHER): Payer: Medicare Other | Admitting: *Deleted

## 2015-03-06 DIAGNOSIS — I4891 Unspecified atrial fibrillation: Secondary | ICD-10-CM

## 2015-03-06 DIAGNOSIS — Z7901 Long term (current) use of anticoagulants: Secondary | ICD-10-CM | POA: Diagnosis not present

## 2015-03-06 LAB — POCT INR: INR: 2

## 2015-03-13 ENCOUNTER — Telehealth: Payer: Self-pay | Admitting: Family Medicine

## 2015-03-13 NOTE — Telephone Encounter (Signed)
Note to nursing staff - please schedule patient for an appointment to discuss chronic pain and to get refills

## 2015-03-13 NOTE — Telephone Encounter (Signed)
Needs refill on hydrocodone

## 2015-03-14 NOTE — Telephone Encounter (Signed)
I am out of town and Dr. Mingo Amber is covering.   Merry Proud - please write for one month supply (90 tabs with fill date no earlier than 6/15, thanks)

## 2015-03-14 NOTE — Telephone Encounter (Signed)
Patient scheduled for MD's first available on 6/27. Patient requesting enough pain meds to make it to appointment.

## 2015-03-15 MED ORDER — HYDROCODONE-ACETAMINOPHEN 5-325 MG PO TABS: 1.0000 | ORAL_TABLET | Freq: Four times a day (QID) | ORAL | Status: DC | PRN

## 2015-03-15 NOTE — Telephone Encounter (Signed)
Left message on voicemail that rx was ready to be picked up and to keep appointment on 6/27.

## 2015-03-15 NOTE — Telephone Encounter (Signed)
I have completed this and given to [facility] as per Dr. Nedra Hai instructions.

## 2015-03-15 NOTE — Addendum Note (Signed)
Addended byMingo Amber, Kayleen Memos on: 03/15/2015 02:44 PM   Modules accepted: Orders

## 2015-03-24 ENCOUNTER — Other Ambulatory Visit: Payer: Self-pay | Admitting: Family Medicine

## 2015-03-29 ENCOUNTER — Other Ambulatory Visit: Payer: Self-pay | Admitting: Family Medicine

## 2015-04-01 ENCOUNTER — Encounter: Payer: Self-pay | Admitting: Family Medicine

## 2015-04-01 ENCOUNTER — Ambulatory Visit (INDEPENDENT_AMBULATORY_CARE_PROVIDER_SITE_OTHER): Payer: Medicare Other | Admitting: *Deleted

## 2015-04-01 ENCOUNTER — Other Ambulatory Visit: Payer: Self-pay | Admitting: Family Medicine

## 2015-04-01 ENCOUNTER — Ambulatory Visit (INDEPENDENT_AMBULATORY_CARE_PROVIDER_SITE_OTHER): Payer: Medicare Other | Admitting: Family Medicine

## 2015-04-01 VITALS — BP 129/83 | HR 85 | Temp 98.0°F | Wt 276.3 lb

## 2015-04-01 DIAGNOSIS — M5136 Other intervertebral disc degeneration, lumbar region: Secondary | ICD-10-CM

## 2015-04-01 DIAGNOSIS — Z23 Encounter for immunization: Secondary | ICD-10-CM | POA: Diagnosis not present

## 2015-04-01 DIAGNOSIS — G8929 Other chronic pain: Secondary | ICD-10-CM

## 2015-04-01 DIAGNOSIS — E1342 Other specified diabetes mellitus with diabetic polyneuropathy: Secondary | ICD-10-CM

## 2015-04-01 DIAGNOSIS — Z0000 Encounter for general adult medical examination without abnormal findings: Secondary | ICD-10-CM | POA: Diagnosis not present

## 2015-04-01 DIAGNOSIS — E2839 Other primary ovarian failure: Secondary | ICD-10-CM | POA: Diagnosis not present

## 2015-04-01 DIAGNOSIS — E119 Type 2 diabetes mellitus without complications: Secondary | ICD-10-CM | POA: Diagnosis not present

## 2015-04-01 DIAGNOSIS — Z7189 Other specified counseling: Secondary | ICD-10-CM

## 2015-04-01 DIAGNOSIS — G629 Polyneuropathy, unspecified: Secondary | ICD-10-CM

## 2015-04-01 DIAGNOSIS — I2510 Atherosclerotic heart disease of native coronary artery without angina pectoris: Secondary | ICD-10-CM

## 2015-04-01 DIAGNOSIS — E1142 Type 2 diabetes mellitus with diabetic polyneuropathy: Secondary | ICD-10-CM

## 2015-04-01 DIAGNOSIS — I4891 Unspecified atrial fibrillation: Secondary | ICD-10-CM | POA: Diagnosis not present

## 2015-04-01 DIAGNOSIS — I129 Hypertensive chronic kidney disease with stage 1 through stage 4 chronic kidney disease, or unspecified chronic kidney disease: Secondary | ICD-10-CM

## 2015-04-01 DIAGNOSIS — Z7901 Long term (current) use of anticoagulants: Secondary | ICD-10-CM

## 2015-04-01 DIAGNOSIS — E1122 Type 2 diabetes mellitus with diabetic chronic kidney disease: Secondary | ICD-10-CM | POA: Insufficient documentation

## 2015-04-01 LAB — BASIC METABOLIC PANEL WITH GFR
BUN: 27 mg/dL — ABNORMAL HIGH (ref 6–23)
CALCIUM: 9.2 mg/dL (ref 8.4–10.5)
CO2: 20 mEq/L (ref 19–32)
Chloride: 103 mEq/L (ref 96–112)
Creat: 1.26 mg/dL — ABNORMAL HIGH (ref 0.50–1.10)
GFR, Est African American: 50 mL/min — ABNORMAL LOW
GFR, Est Non African American: 43 mL/min — ABNORMAL LOW
GLUCOSE: 123 mg/dL — AB (ref 70–99)
POTASSIUM: 4.6 meq/L (ref 3.5–5.3)
Sodium: 137 mEq/L (ref 135–145)

## 2015-04-01 LAB — POCT INR: INR: 2.2

## 2015-04-01 MED ORDER — HYDROCODONE-ACETAMINOPHEN 5-325 MG PO TABS: 1.0000 | ORAL_TABLET | Freq: Four times a day (QID) | ORAL | Status: DC | PRN

## 2015-04-01 MED ORDER — GABAPENTIN 400 MG PO CAPS: 400.0000 mg | ORAL_CAPSULE | Freq: Three times a day (TID) | ORAL | Status: DC

## 2015-04-01 NOTE — Addendum Note (Signed)
Addended by: Leonia Corona R on: 04/01/2015 01:55 PM   Modules accepted: Orders

## 2015-04-01 NOTE — Assessment & Plan Note (Signed)
Patient is postmenopausal and recurs DEXA scan. Nursing staff was able to set up a DEXA scan in the near future.

## 2015-04-01 NOTE — Assessment & Plan Note (Signed)
Patient has a history of CAD and currently is having symptoms consistent with possible heart failure. These include increasing dyspnea on exertion, orthopnea, and lower extremity edema. -Will check 2-D echocardiogram

## 2015-04-01 NOTE — Assessment & Plan Note (Signed)
13 valent pneumonia shot provided today. This was previously offered to the patient however it appears that she received a 23 valent vaccine.

## 2015-04-01 NOTE — Progress Notes (Signed)
   Subjective:    Patient ID: Abigail Duncan, female    DOB: [birth year], 74 y.o.   MRN: [id number]  HPI 74 year old female presents for routine follow-up.  Chronic pain secondary to lumbar degenerative disc disease - patient reports improved radicular symptoms of her right lower extremity with gabapentin during her milligrams 3 times a day, she continues to need when necessary Norco, she will go some days without needing any of the Norco and will need it 2-3 times other days, no associated bladder or bowel incontinence, no new weakness in lower extremities however she does report occasional giving out of her lower extremities, patient does use a cane to help with ambulation  CAD-patient has a history of CAD with heart catheterization in 1993, patient denies any chest pain at this time however does report increasing dyspnea with exertion with the warmer weather, she currently is unable to go greater than 10 steps upstairs without dyspnea, she does report two-pillow orthopnea without PND, mild increased swelling of her lower extremities without shortness of breath  Social-patient is not a former smoker, enjoys gardening  Repetitive-patient is due for 38 female pneumonia and DEXA scan   Review of Systems  Constitutional: Negative for fever, chills and fatigue.  Respiratory: Negative for cough and shortness of breath.   Cardiovascular: Positive for leg swelling. Negative for chest pain.  Gastrointestinal: Negative for nausea, vomiting and diarrhea.       Objective:   Physical Exam Vitals: Reviewed Gen.: Pleasant Caucasian female, no acute distress Cardiac: Irregular irregular rate and rhythm, no murmurs, no heaves or thrills Respiratory: Clear to station bilaterally, normal effort Extremities: Trace edema to midshin in bilateral lower extremities MSK: Tenderness to palpation in both the paraspinal and midline lumbar region Neuro: Strength was 5 out of 5 to bilateral leg flexion as well as  knee flexion and extension, no sensation deficits to light touch were appreciated in the lower extremities, modified slump test did re-create radicular symptoms in the right lower extremity       Assessment & Plan:  Please see problem specific assessment and plan.

## 2015-04-01 NOTE — Assessment & Plan Note (Signed)
Patient continues to have lumbar back pain with right-sided radiculopathy. Mildly improved with increasing dosages of gabapentin. -Increase gabapentin to 400 mg 3 times a day -2 month supply of Norco provided (patient just recently refilled her third prescription from March 2016 therefore will only provide an additional 2 refills to last her total of 3 months) -Patient was counseled that our ultimate goal is to decrease her dependence on Norco, will decrease total monthly number of tablets to 80 at next appointment in 3 months

## 2015-04-01 NOTE — Patient Instructions (Signed)
It was nice to see you today.  A refill of your pain medications has been provided. Please increased Gabapentin to 400 mg three times a day.  Please have the ultrasound of your heart completed and Dr. Ree Kida will call you with the results.  Dr. Ree Kida will call you with your lab results.   Please also have your Dexa scan (bone scan completed).

## 2015-04-01 NOTE — Assessment & Plan Note (Signed)
Patient provided enough refills of Norco to last 3 months.

## 2015-04-02 ENCOUNTER — Telehealth: Payer: Self-pay | Admitting: Family Medicine

## 2015-04-02 ENCOUNTER — Encounter: Payer: Self-pay | Admitting: Family Medicine

## 2015-04-02 NOTE — Telephone Encounter (Signed)
Discussed stable kidney function.

## 2015-04-04 ENCOUNTER — Other Ambulatory Visit: Payer: Self-pay | Admitting: Family Medicine

## 2015-04-10 ENCOUNTER — Ambulatory Visit: Payer: Medicare Other

## 2015-04-29 ENCOUNTER — Telehealth: Payer: Self-pay | Admitting: Family Medicine

## 2015-04-29 ENCOUNTER — Ambulatory Visit (HOSPITAL_COMMUNITY)
Admission: RE | Admit: 2015-04-29 | Discharge: 2015-04-29 | Disposition: A | Discharge status: Home / Self Care | Payer: Medicare Other | Source: Ambulatory Visit | Attending: Family Medicine | Admitting: Family Medicine

## 2015-04-29 ENCOUNTER — Other Ambulatory Visit: Payer: Medicare Other

## 2015-04-29 DIAGNOSIS — I4891 Unspecified atrial fibrillation: Secondary | ICD-10-CM | POA: Insufficient documentation

## 2015-04-29 DIAGNOSIS — R0600 Dyspnea, unspecified: Secondary | ICD-10-CM | POA: Diagnosis present

## 2015-04-29 DIAGNOSIS — I340 Nonrheumatic mitral (valve) insufficiency: Secondary | ICD-10-CM | POA: Diagnosis not present

## 2015-04-29 DIAGNOSIS — I2510 Atherosclerotic heart disease of native coronary artery without angina pectoris: Secondary | ICD-10-CM

## 2015-04-29 DIAGNOSIS — I517 Cardiomegaly: Secondary | ICD-10-CM | POA: Diagnosis not present

## 2015-04-29 DIAGNOSIS — I272 Other secondary pulmonary hypertension: Secondary | ICD-10-CM | POA: Insufficient documentation

## 2015-04-29 NOTE — Telephone Encounter (Signed)
Discussed echo results with patient. See CAD overview. Will refer to cardiology.

## 2015-04-29 NOTE — Assessment & Plan Note (Signed)
Echo showed dilated right ventricle and atrium with PAH.  -will refer to cardiology for further workup.

## 2015-05-06 ENCOUNTER — Ambulatory Visit: Payer: Medicare Other

## 2015-05-07 ENCOUNTER — Ambulatory Visit
Admission: RE | Admit: 2015-05-07 | Discharge: 2015-05-07 | Disposition: A | Discharge status: Home / Self Care | Payer: Medicare Other | Source: Ambulatory Visit | Attending: Family Medicine | Admitting: Family Medicine

## 2015-05-07 ENCOUNTER — Ambulatory Visit (INDEPENDENT_AMBULATORY_CARE_PROVIDER_SITE_OTHER): Payer: Medicare Other | Admitting: *Deleted

## 2015-05-07 DIAGNOSIS — E2839 Other primary ovarian failure: Secondary | ICD-10-CM

## 2015-05-07 DIAGNOSIS — Z780 Asymptomatic menopausal state: Secondary | ICD-10-CM | POA: Diagnosis not present

## 2015-05-07 DIAGNOSIS — Z7901 Long term (current) use of anticoagulants: Secondary | ICD-10-CM

## 2015-05-07 DIAGNOSIS — I4891 Unspecified atrial fibrillation: Secondary | ICD-10-CM

## 2015-05-07 DIAGNOSIS — M8588 Other specified disorders of bone density and structure, other site: Secondary | ICD-10-CM | POA: Diagnosis not present

## 2015-05-07 LAB — POCT INR: INR: 2

## 2015-05-09 ENCOUNTER — Telehealth: Payer: Self-pay | Admitting: Family Medicine

## 2015-05-09 DIAGNOSIS — M8580 Other specified disorders of bone density and structure, unspecified site: Secondary | ICD-10-CM

## 2015-05-09 MED ORDER — CALCIUM CARBONATE-VITAMIN D 500-200 MG-UNIT PO TABS: 2.0000 | ORAL_TABLET | Freq: Every day | ORAL | Status: DC

## 2015-05-09 NOTE — Assessment & Plan Note (Signed)
Osteopenia on Dexa 05/2015. Started on Calcium and vitamin D.

## 2015-05-09 NOTE — Telephone Encounter (Signed)
Called and informed patient that she has osteopenia. Will start Vitamin D and Calcium supplementation. Repeat Dexa in 2 years.

## 2015-06-11 ENCOUNTER — Ambulatory Visit: Payer: Medicare Other

## 2015-06-13 ENCOUNTER — Ambulatory Visit (INDEPENDENT_AMBULATORY_CARE_PROVIDER_SITE_OTHER): Payer: Medicare Other | Admitting: Interventional Cardiology

## 2015-06-13 ENCOUNTER — Encounter: Payer: Self-pay | Admitting: Interventional Cardiology

## 2015-06-13 VITALS — BP 142/80 | HR 73 | Ht 64.0 in | Wt 281.0 lb

## 2015-06-13 DIAGNOSIS — I4891 Unspecified atrial fibrillation: Secondary | ICD-10-CM

## 2015-06-13 DIAGNOSIS — I4819 Other persistent atrial fibrillation: Secondary | ICD-10-CM

## 2015-06-13 DIAGNOSIS — Z7901 Long term (current) use of anticoagulants: Secondary | ICD-10-CM

## 2015-06-13 DIAGNOSIS — I10 Essential (primary) hypertension: Secondary | ICD-10-CM

## 2015-06-13 DIAGNOSIS — Z87898 Personal history of other specified conditions: Secondary | ICD-10-CM

## 2015-06-13 DIAGNOSIS — R0602 Shortness of breath: Secondary | ICD-10-CM

## 2015-06-13 DIAGNOSIS — R0683 Snoring: Secondary | ICD-10-CM | POA: Diagnosis not present

## 2015-06-13 DIAGNOSIS — I481 Persistent atrial fibrillation: Secondary | ICD-10-CM

## 2015-06-13 LAB — BASIC METABOLIC PANEL
BUN: 28 mg/dL — AB (ref 6–23)
CHLORIDE: 101 meq/L (ref 96–112)
CO2: 25 meq/L (ref 19–32)
CREATININE: 1.21 mg/dL — AB (ref 0.40–1.20)
Calcium: 9.7 mg/dL (ref 8.4–10.5)
GFR: 46.47 mL/min — ABNORMAL LOW (ref >60.00)
Glucose, Bld: 104 mg/dL — ABNORMAL HIGH (ref 70–99)
Potassium: 4.3 mEq/L (ref 3.5–5.1)
Sodium: 139 mEq/L (ref 135–145)

## 2015-06-13 LAB — BRAIN NATRIURETIC PEPTIDE: PRO B NATRI PEPTIDE: 202 pg/mL — AB (ref 0.0–100.0)

## 2015-06-13 NOTE — Patient Instructions (Signed)
Medication Instructions:   Your physician recommends that you continue on your current medications as directed. Please refer to the Current Medication list given to you today.    Labwork:   BMET AND BNP    Testing/Procedures:   Your physician has recommended that you have a sleep study. This test records several body functions during sleep, including: brain activity, eye movement, oxygen and carbon dioxide blood levels, heart rate and rhythm, breathing rate and rhythm, the flow of air through your mouth and nose, snoring, body muscle movements, and chest and belly movement.   Follow-Up:   WILL BE BASED UPON PROCEDURE RESULTS    Any Other Special Instructions Will Be Listed Below (If Applicable).

## 2015-06-13 NOTE — Progress Notes (Signed)
Patient ID: Abigail Duncan, female   DOB: 1941/09/17, 73 y.o.   MRN: [id number]     Cardiology Office Note   Date:  06/13/2015   ID:  Abigail Duncan, DOB September 12, 1941, MRN [id number]  PCP:  [name], MD    No chief complaint on file.   Atrial fibrillation, shortness of breath   Wt Readings from Last 3 Encounters:  06/13/15 281 lb (127.461 kg)  04/01/15 276 lb 4.8 oz (125.329 kg)  12/18/14 274 lb 4.8 oz (124.422 kg)       History of Present Illness: Abigail Duncan is a 74 y.o. female   Who has had atrial fibrillation for several years. She recently underwent an echocardiogram. This showed normal left ventricular function with a dilated right ventricle and right atrium. There was evidence of pulmonary hypertension. She denies any chronic lung problems. She does state that she does not sleep well. She snores heavily. She does feel tired in the mornings  after sleeping and can fall sleep easily when she does not mean to.   She walks with the assistance of a walker. She denies any chest discomfort. She does have shortness of breath with walking. She has occasional lower extremity edema. No orthopnea. She has never been a smoker. Her orthopedic issues limit her most.    Past Medical History  Diagnosis Date  . Degenerative arthritis of knee 04/2001    by xray  . Sciatica of left side 12/2003    DDD L5,S1   . Spinal stenosis, lumbar 04/2004    mild on MRI at L 4-5    Past Surgical History  Procedure Laterality Date  . Benign breast tumor      Excised 40+ years ago  . Colposcopy  03/1991    dysplasia  . Ptca (aka angioplasty)  07/1993    RCA     Current Outpatient Prescriptions  Medication Sig Dispense Refill  . calcium-vitamin D (OSCAL WITH D) 500-200 MG-UNIT per tablet Take 2 tablets by mouth daily with breakfast. 180 tablet 1  . CRESTOR 20 MG tablet TAKE 1/2 TABLET BY MOUTH ONCE DAILY 45 tablet 1  . enalapril (VASOTEC) 10 MG tablet TAKE 1 TABLET EVERY DAY 90 tablet 1  .  enalapril-hydrochlorothiazide (VASERETIC) 10-25 MG per tablet TAKE 1 TABLET BY MOUTH DAILY 90 tablet 1  . fluticasone (FLONASE) 50 MCG/ACT nasal spray Place 2 sprays into the nose daily. In each nostril daily 16 g 11  . gabapentin (NEURONTIN) 400 MG capsule Take 1 capsule (400 mg total) by mouth 3 (three) times daily. 90 capsule 2  . HYDROcodone-acetaminophen (NORCO/VICODIN) 5-325 MG per tablet Take 1 tablet by mouth every 6 (six) hours as needed for moderate pain. Do not fill until 6/15 90 tablet 0  . metFORMIN (GLUCOPHAGE) 500 MG tablet TAKE 1 TABLET BY MOUTH TWICE A DAY WITH A MEAL 180 tablet 1  . metoprolol (LOPRESSOR) 100 MG tablet TAKE 1 TABLET TWICE A DAY 180 tablet 1  . NIFEdipine (PROCARDIA XL/ADALAT-CC) 90 MG 24 hr tablet TAKE 1 TABLET EVERY DAY 90 tablet 1  . nitroGLYCERIN (NITROSTAT) 0.4 MG SL tablet Place 1 tablet (0.4 mg total) under the tongue every 5 (five) minutes as needed for chest pain. 20 tablet 3  . warfarin (COUMADIN) 5 MG tablet TAKE AS DIRECTED 30 tablet 10   No current facility-administered medications for this visit.    Allergies:   Lisinopril    Social History:  The patient  reports that  she has never smoked. She has never used smokeless tobacco. She reports that she does not drink alcohol or use illicit drugs.   Family History:  The patient's family history includes Cancer in her mother and sister; Cancer (age of onset: 107) in her father; Diabetes in her brother.    ROS:  Please see the history of present illness.   Otherwise, review of systems are positive for  Joint pains , shortness of breath as noted above.   All other systems are reviewed and negative.    PHYSICAL EXAM: VS:  BP 142/80 mmHg  Pulse 73  Ht 5\' 4"  (1.626 m)  Wt 281 lb (127.461 kg)  BMI 48.21 kg/m2 , BMI Body mass index is 48.21 kg/(m^2). GEN: Well nourished, well developed, in no acute distress HEENT: normal Neck: no JVD, carotid bruits, or masses Cardiac:  Irregularly irregular; no  murmurs, rubs, or gallops,no edema  Respiratory:  clear to auscultation bilaterally, normal work of breathing GI: soft, nontender, nondistended, + BS MS: no deformity or atrophy,  Slow gait Skin: warm and dry, no rash Neuro:  Strength and sensation are intact Psych: euthymic mood, full affect   EKG:   The ekg ordered today demonstrates  Atrial fibrillation with controlled ventricular response , poor R wave progression   Recent Labs: 12/18/2014: Hemoglobin 13.3; Platelets 348 04/01/2015: BUN 27*; Creat 1.26*; Potassium 4.6; Sodium 137   Lipid Panel    Component Value Date/Time   CHOL 150 12/18/2014 1105   TRIG 261* 12/18/2014 1105   HDL 37* 12/18/2014 1105   CHOLHDL 4.1 12/18/2014 1105   VLDL 52* 12/18/2014 1105   LDLCALC 61 12/18/2014 1105   LDLDIRECT 59 09/03/2009 2024     Other studies Reviewed: Additional studies/ records that were reviewed today with results demonstrating:  Echocardiogram as described above.   ASSESSMENT AND PLAN:  1.  atrial fibrillation: Currently rate controlled. Coumadin for stroke prevention. Continue current medical therapy. This could be related to undiagnosed sleep apnea given her other symptoms.  2.  shortness of breath: Check BNP and electrolytes. Look for fluid overload. If she doesn't fact have fluid overload on her hydrochlorothiazide, would have to consider changing her diuretics from HCTZ to furosemide. 3.  likely obstructive sleep apnea: She has several symptoms. Will send for  Sleep study. This could explain her right heart changes on her recent abnormal echocardiogram. It would also contribute to her fatigue, shortness of breath and atrial fibrillation. 4.  Would not plan ischemic workup at this point. She does have the diagnosis of coronary artery disease in the chart but she does not think she has ever had any revascularization performed.  She did have a heart catheterization over 20 years ago.  If she does develop any chest discomfort, she  should let us know.   Current medicines are reviewed at length with the patient today.  The patient concerns regarding her medicines were addressed.  The following changes have been made:  No change  Labs/ tests ordered today include:   Orders Placed This Encounter  Procedures  . Basic Metabolic Panel (BMET)  . B Nat Peptide  . EKG 12-Lead  . Split night study    Recommend 150 minutes/week of aerobic exercise Low fat, low carb, high fiber diet recommended  Disposition:   FU  After sleep study. Will call her with lab results.   Teresita Madura., MD  06/13/2015 12:41 PM    [facility],  [place], [place]  [address] Phone: [phone]; Fax: [phone]

## 2015-06-14 ENCOUNTER — Ambulatory Visit (INDEPENDENT_AMBULATORY_CARE_PROVIDER_SITE_OTHER): Payer: Medicare Other | Admitting: *Deleted

## 2015-06-14 ENCOUNTER — Ambulatory Visit (INDEPENDENT_AMBULATORY_CARE_PROVIDER_SITE_OTHER): Payer: Medicare Other | Admitting: Family Medicine

## 2015-06-14 ENCOUNTER — Encounter: Payer: Self-pay | Admitting: Family Medicine

## 2015-06-14 VITALS — BP 138/79 | HR 78 | Temp 98.2°F | Ht 64.0 in | Wt 275.0 lb

## 2015-06-14 DIAGNOSIS — Z23 Encounter for immunization: Secondary | ICD-10-CM

## 2015-06-14 DIAGNOSIS — M51369 Other intervertebral disc degeneration, lumbar region without mention of lumbar back pain or lower extremity pain: Secondary | ICD-10-CM

## 2015-06-14 DIAGNOSIS — M7989 Other specified soft tissue disorders: Secondary | ICD-10-CM

## 2015-06-14 DIAGNOSIS — E119 Type 2 diabetes mellitus without complications: Secondary | ICD-10-CM

## 2015-06-14 DIAGNOSIS — Z7901 Long term (current) use of anticoagulants: Secondary | ICD-10-CM | POA: Diagnosis not present

## 2015-06-14 DIAGNOSIS — I272 Pulmonary hypertension, unspecified: Secondary | ICD-10-CM

## 2015-06-14 DIAGNOSIS — I270 Primary pulmonary hypertension: Secondary | ICD-10-CM

## 2015-06-14 DIAGNOSIS — I481 Persistent atrial fibrillation: Secondary | ICD-10-CM | POA: Diagnosis not present

## 2015-06-14 DIAGNOSIS — Z7189 Other specified counseling: Secondary | ICD-10-CM

## 2015-06-14 DIAGNOSIS — G8929 Other chronic pain: Secondary | ICD-10-CM

## 2015-06-14 DIAGNOSIS — I4891 Unspecified atrial fibrillation: Secondary | ICD-10-CM | POA: Diagnosis not present

## 2015-06-14 DIAGNOSIS — I10 Essential (primary) hypertension: Secondary | ICD-10-CM

## 2015-06-14 DIAGNOSIS — I4819 Other persistent atrial fibrillation: Secondary | ICD-10-CM

## 2015-06-14 DIAGNOSIS — M5136 Other intervertebral disc degeneration, lumbar region: Secondary | ICD-10-CM

## 2015-06-14 HISTORY — DX: Other specified soft tissue disorders: M79.89

## 2015-06-14 LAB — POCT INR: INR: 2.3

## 2015-06-14 LAB — POCT GLYCOSYLATED HEMOGLOBIN (HGB A1C): Hemoglobin A1C: 6.4

## 2015-06-14 MED ORDER — HYDROCODONE-ACETAMINOPHEN 5-325 MG PO TABS: 1.0000 | ORAL_TABLET | Freq: Four times a day (QID) | ORAL | Status: DC | PRN

## 2015-06-14 MED ORDER — FUROSEMIDE 20 MG PO TABS: 20.0000 mg | ORAL_TABLET | Freq: Every day | ORAL | Status: DC

## 2015-06-14 NOTE — Progress Notes (Signed)
   Subjective:    Patient ID: Abigail Duncan, female    DOB: 1940/12/30, 75 y.o.   MRN: [id number]  HPI 74 y/o female presents for routine visit.  DM - does not check regularly, states that last reading was "6.00", reports compliance with medications (metformin 500 mg BID), no foot issues, has not seen an eye physician in over a year  SOB/leg swelling - patient recently had ECHO that showed afib (lonstanding)/normal EF/pulmonary HTN, seen by cardiology on 06/13/15, no ischemic workup planned, scheduled for sleep study, BNP and BMP ordered (see below). Dyspnea has improved, still has 2 pillow orthopnea, leg swelling still present bilaterally, no calf tenderness  Back pain/sciatica/chronic narcotics - patient requests refill, taking 3 Norco per day, resistant to de-escalation at this time, symptoms stable, no bladder or bowel incontinence, no saddle anesthesia  Social - not a smoker, lives with son   Review of Systems  Constitutional: Negative for fever, chills and fatigue.  Respiratory: Positive for shortness of breath. Negative for cough and choking.   Cardiovascular: Positive for leg swelling. Negative for chest pain and palpitations.  Gastrointestinal: Negative for nausea, vomiting, diarrhea and constipation.       Objective:   Physical Exam Vitals: reviewed Gen: pleasant female, NAD Cardiac: irregular irregular rhythm, no murmur Resp: CTAB, normal effort Abd: soft, no tenderness, normal bowel sounds Ext: 2+ edema  Pro BNP 202 (no comparison) BMP: Cr 1.21 (stable), no electrolyte abnormalities     Assessment & Plan:  Please see problem specific assessment and plan.

## 2015-06-14 NOTE — Assessment & Plan Note (Signed)
Controlled. A1C 6.4. -continue Metformin -encouraged follow up with Ophthalmology

## 2015-06-14 NOTE — Assessment & Plan Note (Signed)
Clinically suspect due to venous insufficiency and PAH/valve abnormalities. Review of chart noted normal TSH and CBC. -start trial of Lasix 20 mg daily -check BMP in 1-2 weeks to monitor Cr

## 2015-06-14 NOTE — Assessment & Plan Note (Signed)
3 month supply of Norco provided

## 2015-06-14 NOTE — Patient Instructions (Signed)
It was nice to see you today.  DM - Dr. Ree Kida will call you with your A1C results. Please follow up with your eye doctor.  Leg swelling - start lasix 20 mg daily (fluid pill), take in the morning, check labs with next INR check  Back pain - refills of pain medications provided, return in 3 months for refills, consider decreasing dose at that time.

## 2015-06-14 NOTE — Assessment & Plan Note (Addendum)
Evaluated by Cardiology on 06/13/15. -plan for sleep study -no ischemic workup planned

## 2015-06-14 NOTE — Assessment & Plan Note (Signed)
Symptoms stable. No red flag signs/symtoms. -refill of Norco provided -continue Gabapentin

## 2015-06-17 ENCOUNTER — Encounter: Payer: Self-pay | Admitting: Family Medicine

## 2015-06-29 ENCOUNTER — Other Ambulatory Visit: Payer: Self-pay | Admitting: Family Medicine

## 2015-07-13 ENCOUNTER — Other Ambulatory Visit: Payer: Self-pay | Admitting: Family Medicine

## 2015-07-19 ENCOUNTER — Ambulatory Visit: Payer: Medicare Other

## 2015-07-23 ENCOUNTER — Ambulatory Visit (INDEPENDENT_AMBULATORY_CARE_PROVIDER_SITE_OTHER): Payer: Medicare Other | Admitting: *Deleted

## 2015-07-23 DIAGNOSIS — I4819 Other persistent atrial fibrillation: Secondary | ICD-10-CM

## 2015-07-23 DIAGNOSIS — I481 Persistent atrial fibrillation: Secondary | ICD-10-CM

## 2015-07-23 DIAGNOSIS — Z7901 Long term (current) use of anticoagulants: Secondary | ICD-10-CM | POA: Diagnosis not present

## 2015-07-23 DIAGNOSIS — I4891 Unspecified atrial fibrillation: Secondary | ICD-10-CM | POA: Diagnosis not present

## 2015-07-23 LAB — POCT INR: INR: 2

## 2015-07-31 ENCOUNTER — Ambulatory Visit (HOSPITAL_BASED_OUTPATIENT_CLINIC_OR_DEPARTMENT_OTHER): Payer: Medicare Other | Attending: Interventional Cardiology | Admitting: *Deleted

## 2015-07-31 VITALS — Ht 64.0 in | Wt 281.0 lb

## 2015-07-31 DIAGNOSIS — G4733 Obstructive sleep apnea (adult) (pediatric): Secondary | ICD-10-CM | POA: Diagnosis not present

## 2015-07-31 DIAGNOSIS — R0683 Snoring: Secondary | ICD-10-CM

## 2015-07-31 DIAGNOSIS — Z87898 Personal history of other specified conditions: Secondary | ICD-10-CM

## 2015-07-31 DIAGNOSIS — G4734 Idiopathic sleep related nonobstructive alveolar hypoventilation: Secondary | ICD-10-CM

## 2015-07-31 DIAGNOSIS — R0902 Hypoxemia: Secondary | ICD-10-CM

## 2015-07-31 DIAGNOSIS — G4736 Sleep related hypoventilation in conditions classified elsewhere: Secondary | ICD-10-CM | POA: Diagnosis not present

## 2015-07-31 DIAGNOSIS — R5383 Other fatigue: Secondary | ICD-10-CM | POA: Diagnosis not present

## 2015-08-17 NOTE — Sleep Study (Addendum)
Patient Name: Abigail Duncan, Abigail Duncan Date: 07/31/2015 Gender: Female D.O.B: 06-16-1941 Age (years): 74 Referring Provider: Larae Grooms Height (inches): 64 Interpreting Physician: Shelva Majestic MD, ABSM Weight (lbs): 285 RPSGT: Gerhard Perches BMI: 49 MRN: [id number] Neck Size: 18.00  CLINICAL INFORMATION Sleep Study Type: NPSG  Indication for sleep study: Fatigue, Snoring Epworth Sleepiness Score: 2  SLEEP STUDY TECHNIQUE As per the AASM Manual for the Scoring of Sleep and Associated Events v2.3 (April 2016) with a hypopnea requiring 4% desaturations. The channels recorded and monitored were frontal, central and occipital EEG, electrooculogram (EOG), submentalis EMG (chin), nasal and oral airflow, thoracic and abdominal wall motion, anterior tibialis EMG, snore microphone, electrocardiogram, and pulse oximetry.  MEDICATIONS  calcium-vitamin D (OSCAL WITH D) 500-200 MG-UNIT per tablet 2 tablet, Daily with breakfast     CRESTOR 20 MG tablet      enalapril (VASOTEC) 10 MG tablet      enalapril-hydrochlorothiazide (VASERETIC) 10-25 MG per tablet      fluticasone (FLONASE) 50 MCG/ACT nasal spray 2 spray, Daily     furosemide (LASIX) 20 MG tablet 20 mg, Daily     gabapentin (NEURONTIN) 400 MG capsule      HYDROcodone-acetaminophen (NORCO/VICODIN) 5-325 MG per tablet 1 tablet, Every 6 hours PRN     metFORMIN (GLUCOPHAGE) 500 MG tablet      metoprolol (LOPRESSOR) 100 MG tablet      NIFEdipine (PROCARDIA XL/ADALAT-CC) 90 MG 24 hr tablet      nitroGLYCERIN (NITROSTAT) 0.4 MG SL tablet 0.4 mg, Every 5 min PRN     warfarin (COUMADIN)    Medications self-administered by patient during sleep study : No sleep medicine administered.  SLEEP ARCHITECTURE The study was initiated at 10:42:41 PM and ended at 4:54:11 AM. Sleep onset time was 15.8 minutes and the sleep efficiency was 68.8%. The total sleep time was 255.5 minutes. Wake after sleep onset was 100.2 minutes Stage REM  latency was 109.5 minutes. The patient spent 4.50% of the night in stage N1 sleep, 63.01% in stage N2 sleep, 16.24% in stage N3 and 16.24% in REM. Alpha intrusion was absent. Supine sleep was 0.00%.  RESPIRATORY PARAMETERS The overall apnea/hypopnea index (AHI) was 2.8 per hour. There were 0 total apneas, including 0 obstructive, 0 central and 0 mixed apneas. There were 12 hypopneas and 0 RERAs. The AHI during Stage REM sleep was 15.9 per hour. AHI while supine was N/A per hour. The mean oxygen saturation was 89.61%. The minimum SpO2 during sleep was 82.00%. Moderate snoring was noted during this study.  CARDIAC DATA The 2 lead EKG demonstrated atrial fibrillation. The mean heart rate was 55.44 beats per minute. Other EKG findings include: None.  LEG MOVEMENT DATA The total PLMS were 251 with a resulting PLMS index of 58.94. Associated arousal with leg movement index was 0.7  . IMPRESSIONS - Increased upper airway resistance syndrome (UARS) overall with an AHI of 2.8 per hour.  However, the patient had moderate obstructive sleep apnea during REM sleep  with an AHI of 15.9/hr.  Importantly, the patient was unable to sleep supine.  The overall AHI may be an underestimation as to the severity of the patient's sleep-disordered breathing due to the absence of supine sleep. - No significant central sleep apnea occurred during this study (CAI = 0.0/h). - Moderate oxygen desaturation was noted during this study (Min O2 = 82.00%). - Reduced sleep efficiency at 68.8%. - Moderate snoring - No cardiac abnormalities were noted during this study. -  Severe periodic limb movements of sleep within index of 58.94. No significant associated arousals.  DIAGNOSIS - Nocturnal Hypoxemia (327.26 [G47.36 ICD-10]) - Obstructive sleep apnea  RECOMMENDATIONS - Although the patient may have more severe sleep disorder breathing than indicated due to the absence of supine sleep and has moderate obstructive sleep  apnea during REM sleep, with the overall AHI of 2/8/hr the patient currently does not meet criteria for initiation of CPAP therapy. - Consider initial alternatives for the treatment of snoring and UARS. - If the patient continues to be symptomatic with his cardiovascular comorbidities recommend a follow-up evaluation with achievement of supine sleep. - Avoid alcohol, sedatives and other CNS depressants that may worsen sleep apnea and disrupt normal sleep architecture. - Sleep hygiene should be reviewed to assess factors that may improve sleep quality. - Weight management and regular exercise should be initiated or continued if appropriate. - If patient is symptomatic with restless leg syndrome, consider therapy with his elevated PLMS index of 58.94 - Consider follow-up nocturnal oxygen saturation study in light of oxygen desaturation to a nadir of 82%.   Troy Sine, MD, [place], American Board of Sleep Medicine  ELECTRONICALLY SIGNED ON:  08/17/2015, 10:30 AM [place] PH: [phone]   FX: [phone] [place]

## 2015-08-24 ENCOUNTER — Other Ambulatory Visit: Payer: Self-pay | Admitting: Family Medicine

## 2015-08-25 ENCOUNTER — Other Ambulatory Visit: Payer: Self-pay | Admitting: Family Medicine

## 2015-08-26 ENCOUNTER — Telehealth: Payer: Self-pay | Admitting: Family Medicine

## 2015-08-26 NOTE — Telephone Encounter (Signed)
I am covering for Dr. Ree Kida who is away from the office.  Red team, please call patient and let her know that I refilled her enalapril-HCTZ but she needs to come in for a lab visit. Last seen in Sept and was supposed to come in for BMET within 1-2 weeks. Please advise her to schedule this appointment.  Thanks,  Leeanne Rio, MD

## 2015-08-27 ENCOUNTER — Ambulatory Visit (INDEPENDENT_AMBULATORY_CARE_PROVIDER_SITE_OTHER): Payer: Medicare Other | Admitting: *Deleted

## 2015-08-27 DIAGNOSIS — I481 Persistent atrial fibrillation: Secondary | ICD-10-CM

## 2015-08-27 DIAGNOSIS — Z7901 Long term (current) use of anticoagulants: Secondary | ICD-10-CM

## 2015-08-27 DIAGNOSIS — E119 Type 2 diabetes mellitus without complications: Secondary | ICD-10-CM

## 2015-08-27 DIAGNOSIS — I4819 Other persistent atrial fibrillation: Secondary | ICD-10-CM

## 2015-08-27 DIAGNOSIS — I10 Essential (primary) hypertension: Secondary | ICD-10-CM | POA: Diagnosis not present

## 2015-08-27 LAB — BASIC METABOLIC PANEL
BUN: 33 mg/dL — ABNORMAL HIGH (ref 7–25)
CHLORIDE: 101 mmol/L (ref 98–110)
CO2: 22 mmol/L (ref 20–31)
Calcium: 8.7 mg/dL (ref 8.6–10.4)
Creat: 1.32 mg/dL — ABNORMAL HIGH (ref 0.60–0.93)
Glucose, Bld: 150 mg/dL — ABNORMAL HIGH (ref 65–99)
POTASSIUM: 4.3 mmol/L (ref 3.5–5.3)
Sodium: 137 mmol/L (ref 135–146)

## 2015-08-27 LAB — POCT INR: INR: 2.1

## 2015-08-27 NOTE — Telephone Encounter (Signed)
Attempted to call pt, no answer. Will try again later. Deseree Blount, CMA  

## 2015-08-27 NOTE — Telephone Encounter (Signed)
Pt came in for an INR check and she got BMET drawn while she was here. Avangelina Flight Kennon Holter, CMA

## 2015-09-02 ENCOUNTER — Encounter: Payer: Self-pay | Admitting: Family Medicine

## 2015-09-18 ENCOUNTER — Other Ambulatory Visit: Payer: Self-pay | Admitting: *Deleted

## 2015-09-18 MED ORDER — ENALAPRIL MALEATE 10 MG PO TABS: 10.0000 mg | ORAL_TABLET | Freq: Every day | ORAL | Status: DC

## 2015-09-19 ENCOUNTER — Ambulatory Visit (INDEPENDENT_AMBULATORY_CARE_PROVIDER_SITE_OTHER): Payer: Medicare Other | Admitting: *Deleted

## 2015-09-19 ENCOUNTER — Encounter: Payer: Self-pay | Admitting: Family Medicine

## 2015-09-19 ENCOUNTER — Ambulatory Visit (INDEPENDENT_AMBULATORY_CARE_PROVIDER_SITE_OTHER): Payer: Medicare Other | Admitting: Family Medicine

## 2015-09-19 VITALS — Temp 98.3°F | Ht 64.0 in | Wt 277.0 lb

## 2015-09-19 DIAGNOSIS — G2581 Restless legs syndrome: Secondary | ICD-10-CM

## 2015-09-19 DIAGNOSIS — M5136 Other intervertebral disc degeneration, lumbar region: Secondary | ICD-10-CM

## 2015-09-19 DIAGNOSIS — E1142 Type 2 diabetes mellitus with diabetic polyneuropathy: Secondary | ICD-10-CM

## 2015-09-19 DIAGNOSIS — Z7901 Long term (current) use of anticoagulants: Secondary | ICD-10-CM | POA: Diagnosis not present

## 2015-09-19 DIAGNOSIS — I481 Persistent atrial fibrillation: Secondary | ICD-10-CM

## 2015-09-19 DIAGNOSIS — E119 Type 2 diabetes mellitus without complications: Secondary | ICD-10-CM

## 2015-09-19 DIAGNOSIS — G8929 Other chronic pain: Secondary | ICD-10-CM

## 2015-09-19 DIAGNOSIS — I4819 Other persistent atrial fibrillation: Secondary | ICD-10-CM

## 2015-09-19 DIAGNOSIS — Z7189 Other specified counseling: Secondary | ICD-10-CM

## 2015-09-19 DIAGNOSIS — I272 Other secondary pulmonary hypertension: Secondary | ICD-10-CM

## 2015-09-19 DIAGNOSIS — M51369 Other intervertebral disc degeneration, lumbar region without mention of lumbar back pain or lower extremity pain: Secondary | ICD-10-CM

## 2015-09-19 DIAGNOSIS — I10 Essential (primary) hypertension: Secondary | ICD-10-CM

## 2015-09-19 LAB — POCT GLYCOSYLATED HEMOGLOBIN (HGB A1C): HEMOGLOBIN A1C: 6.1

## 2015-09-19 LAB — POCT INR: INR: 2.1

## 2015-09-19 MED ORDER — HYDROCODONE-ACETAMINOPHEN 5-325 MG PO TABS: 1.0000 | ORAL_TABLET | Freq: Four times a day (QID) | ORAL | Status: DC | PRN

## 2015-09-19 MED ORDER — METFORMIN HCL 500 MG PO TABS: 500.0000 mg | ORAL_TABLET | Freq: Every day | ORAL | Status: DC

## 2015-09-19 NOTE — Assessment & Plan Note (Signed)
BP mildly elevated today. Better controlled on recent visits. -no changes today and monitor at subsequent visits

## 2015-09-19 NOTE — Patient Instructions (Signed)
It was nice to see you today.  Blood pressure - slightly high today, continue to check at home  Diabetes - well controlled, decrease metformin to 500 mg daily, Make an appointment to see the ophthalmologist  Neuropathy - continue Gabapentin 400 mg daily  Back pain - continue current pain medication regimen  Mammogram - make an appointment to get a mammogram done  Sleep study - you did not qualify for a CPAP machine, consider home oxygen at night  Restless legs - please let Dr. Ree Kida know if you would like a medication to help with this  Return in 3 months

## 2015-09-19 NOTE — Assessment & Plan Note (Signed)
Stable on Gabapentin 400 mg daily, patient declined increase today -monitor at subsequent visits.

## 2015-09-19 NOTE — Assessment & Plan Note (Signed)
Controlled. A1C 6.1. No hypoglycemia. -decrease Metformin to 500 once daily -patient to schedule eye exam

## 2015-09-19 NOTE — Progress Notes (Signed)
Subjective:    Patient ID: Abigail Duncan, female    DOB: Sep 26, 1941, 74 y.o.   MRN: [id number]  HPI 74 y/o female presents for routine follow up.  Non-IDT2DM - compliant with Metformin 500 BID, does not exercise regularly, reports decreased appetite over the past few months (no early satiety/nausea/emesis). Typical day includes cereal for breakfast, multiple vegetables for lunch/dinner, occasional snacks. Due for eye appointment.   Peripheral Neuropathy - patient reports stable numbness in feet with Gabapentin 400 mg daily  Afib on chronic anticoagulation - warfarin managed in office, no recent bleeding, no palpitation, no chest pain, leg swelling is stable on lasix  Possible OSA - recent sleep study, results below  Restless Legs - patient reports nightly leg symptoms, relieved with placing feet on the floor  Lumbar back pain/DDD - needs refill on Norco, pain rated as 4/10 with medication, continues to have daily lumbar back pain (right > left) with radiation of symptoms down the right leg, no bladder/bowel incontinence  Social - never a smoker   Review of Systems  Constitutional: Negative for fever, chills and fatigue.  Respiratory: Positive for shortness of breath. Negative for cough.   Cardiovascular: Positive for leg swelling. Negative for chest pain and palpitations.  Gastrointestinal: Negative for nausea, abdominal pain, diarrhea, constipation and abdominal distention.  Musculoskeletal: Negative for back pain.       Objective:   Physical Exam Vitals: reviewed Gen: pleasant female, NAD Cardiac: Irregular Irregular rate and rhythm, no murmur Resp: CTAB, normal effort Abd: obese, soft, normal bowel sounds Ext: 1+ edema of bilateral legs MSK: lumbar paraspinal tenderness bilaterally, no midline tenderness, modified slump test positive on the right for radicular symptoms  Sleep Study - completed on 08/17/15 IMPRESSIONS - Increased upper airway resistance syndrome (UARS)  overall with an AHI of 2.8 per hour. However, the patient had moderate obstructive sleep apnea during REM sleep with an AHI of 15.9/hr. Importantly, the patient was unable to sleep supine. The overall AHI may be an underestimation as to the severity of the patient's sleep-disordered breathing due to the absence of supine sleep. - No significant central sleep apnea occurred during this study (CAI = 0.0/h). - Moderate oxygen desaturation was noted during this study (Min O2 = 82.00%). - Reduced sleep efficiency at 68.8%. - Moderate snoring - No cardiac abnormalities were noted during this study. - Severe periodic limb movements of sleep within index of 58.94. No significant associated arousals.  POC A1C 6.1 INR 2.1    Assessment & Plan:  Type 2 diabetes mellitus, controlled Controlled. A1C 6.1. No hypoglycemia. -decrease Metformin to 500 once daily -patient to schedule eye exam  HYPERTENSION, BENIGN SYSTEMIC BP mildly elevated today. Better controlled on recent visits. -no changes today and monitor at subsequent visits  Atrial fibrillation Rate controlled. Asymptomatic. -INR within range -continue Metoprolol   Diabetic peripheral neuropathy Stable on Gabapentin 400 mg daily, patient declined increase today -monitor at subsequent visits.  Encounter for chronic pain management 3 month Refill for Norco provided. -see plan for Lumbar DDD  DDD (degenerative disc disease), lumbar Symptoms stable. No red flag signs/symptoms. -refill for Norco provided -patient expressed no interest in repeat MRI or surgery at this time.   Pulmonary hypertension Sleep study showed some apnea and nighttime desaturations. However, did not meet criteria for CPAP. -offered nighttime oxygen, patient is going to consider  Restless leg syndrome Nighttime symptoms consistent with RLS. Hemoglobin normal so not a candidate for iron supplementation.  -patient declined Requip  at this time

## 2015-09-19 NOTE — Assessment & Plan Note (Signed)
3 month Refill for Norco provided. -see plan for Lumbar DDD

## 2015-09-19 NOTE — Assessment & Plan Note (Signed)
Sleep study showed some apnea and nighttime desaturations. However, did not meet criteria for CPAP. -offered nighttime oxygen, patient is going to consider

## 2015-09-19 NOTE — Assessment & Plan Note (Addendum)
Rate controlled. Asymptomatic. -INR within range -continue Metoprolol

## 2015-09-19 NOTE — Assessment & Plan Note (Signed)
Symptoms stable. No red flag signs/symptoms. -refill for Norco provided -patient expressed no interest in repeat MRI or surgery at this time.

## 2015-09-19 NOTE — Assessment & Plan Note (Signed)
Nighttime symptoms consistent with RLS. Hemoglobin normal so not a candidate for iron supplementation.  -patient declined Requip at this time

## 2015-09-26 ENCOUNTER — Other Ambulatory Visit: Payer: Self-pay | Admitting: Family Medicine

## 2015-10-07 ENCOUNTER — Other Ambulatory Visit: Payer: Self-pay | Admitting: Family Medicine

## 2015-10-14 ENCOUNTER — Other Ambulatory Visit: Payer: Self-pay | Admitting: Family Medicine

## 2015-10-23 ENCOUNTER — Ambulatory Visit (INDEPENDENT_AMBULATORY_CARE_PROVIDER_SITE_OTHER): Payer: Medicare Other | Admitting: *Deleted

## 2015-10-23 DIAGNOSIS — I481 Persistent atrial fibrillation: Secondary | ICD-10-CM

## 2015-10-23 DIAGNOSIS — Z7901 Long term (current) use of anticoagulants: Secondary | ICD-10-CM | POA: Diagnosis not present

## 2015-10-23 DIAGNOSIS — I4819 Other persistent atrial fibrillation: Secondary | ICD-10-CM

## 2015-10-23 LAB — POCT INR: INR: 2

## 2015-11-24 ENCOUNTER — Other Ambulatory Visit: Payer: Self-pay | Admitting: Family Medicine

## 2015-11-27 ENCOUNTER — Ambulatory Visit: Payer: Medicare Other

## 2015-11-29 ENCOUNTER — Ambulatory Visit (INDEPENDENT_AMBULATORY_CARE_PROVIDER_SITE_OTHER): Payer: Medicare Other | Admitting: *Deleted

## 2015-11-29 DIAGNOSIS — I481 Persistent atrial fibrillation: Secondary | ICD-10-CM | POA: Diagnosis not present

## 2015-11-29 DIAGNOSIS — Z7901 Long term (current) use of anticoagulants: Secondary | ICD-10-CM | POA: Diagnosis not present

## 2015-11-29 DIAGNOSIS — I4819 Other persistent atrial fibrillation: Secondary | ICD-10-CM

## 2015-11-29 LAB — POCT INR: INR: 2.3

## 2015-11-30 ENCOUNTER — Other Ambulatory Visit: Payer: Self-pay | Admitting: Family Medicine

## 2015-12-20 ENCOUNTER — Ambulatory Visit (INDEPENDENT_AMBULATORY_CARE_PROVIDER_SITE_OTHER): Payer: Medicare Other | Admitting: *Deleted

## 2015-12-20 ENCOUNTER — Encounter: Payer: Self-pay | Admitting: Family Medicine

## 2015-12-20 ENCOUNTER — Ambulatory Visit (INDEPENDENT_AMBULATORY_CARE_PROVIDER_SITE_OTHER): Payer: Medicare Other | Admitting: Family Medicine

## 2015-12-20 VITALS — BP 117/60 | HR 79 | Temp 98.7°F | Ht 64.0 in | Wt 277.0 lb

## 2015-12-20 DIAGNOSIS — I481 Persistent atrial fibrillation: Secondary | ICD-10-CM | POA: Diagnosis not present

## 2015-12-20 DIAGNOSIS — M51369 Other intervertebral disc degeneration, lumbar region without mention of lumbar back pain or lower extremity pain: Secondary | ICD-10-CM

## 2015-12-20 DIAGNOSIS — G8929 Other chronic pain: Secondary | ICD-10-CM

## 2015-12-20 DIAGNOSIS — Z7901 Long term (current) use of anticoagulants: Secondary | ICD-10-CM | POA: Diagnosis not present

## 2015-12-20 DIAGNOSIS — E119 Type 2 diabetes mellitus without complications: Secondary | ICD-10-CM | POA: Diagnosis not present

## 2015-12-20 DIAGNOSIS — M5136 Other intervertebral disc degeneration, lumbar region: Secondary | ICD-10-CM

## 2015-12-20 DIAGNOSIS — E1142 Type 2 diabetes mellitus with diabetic polyneuropathy: Secondary | ICD-10-CM | POA: Diagnosis not present

## 2015-12-20 DIAGNOSIS — Z0000 Encounter for general adult medical examination without abnormal findings: Secondary | ICD-10-CM

## 2015-12-20 DIAGNOSIS — Z7189 Other specified counseling: Secondary | ICD-10-CM

## 2015-12-20 DIAGNOSIS — I10 Essential (primary) hypertension: Secondary | ICD-10-CM | POA: Diagnosis not present

## 2015-12-20 DIAGNOSIS — I4819 Other persistent atrial fibrillation: Secondary | ICD-10-CM

## 2015-12-20 LAB — POCT GLYCOSYLATED HEMOGLOBIN (HGB A1C): HEMOGLOBIN A1C: 6.6

## 2015-12-20 LAB — POCT INR: INR: 2.3

## 2015-12-20 MED ORDER — GABAPENTIN 300 MG PO CAPS: 600.0000 mg | ORAL_CAPSULE | Freq: Three times a day (TID) | ORAL | Status: DC

## 2015-12-20 MED ORDER — HYDROCODONE-ACETAMINOPHEN 5-325 MG PO TABS: 1.0000 | ORAL_TABLET | Freq: Four times a day (QID) | ORAL | Status: DC | PRN

## 2015-12-20 NOTE — Assessment & Plan Note (Signed)
Rate controlled. INR in range. -continue Metoprolol -continue Warfarin

## 2015-12-20 NOTE — Assessment & Plan Note (Signed)
Controlled. -no changes in therapy today

## 2015-12-20 NOTE — Assessment & Plan Note (Signed)
Controlled. A1C 6.7. -continue Metformin 500 mg daily -Patient to follow up with ophthalmologist -foot exam completed today

## 2015-12-20 NOTE — Progress Notes (Signed)
   Subjective:    Patient ID: Abigail Duncan, female    DOB: 05-Dec-1940, 75 y.o.   MRN: [id number]  HPI 75 y/o female presents for routine follow up.  DM - does not check blood sugars regularly, taking Metformin 500 once daily, still needs to go see eye doctor (had to cancel, needs to reschedule).   Peripheral Neuropathy - taking Gabapentin 400 mg TID, pain is controlled, still has numbness in feet, manageable  Afib/Chronic anticoagulation - INR have been in range, no chest pain, no palpitations, denies leg swelling  HTN - no chest pain, no headaches, no changes in BP meds since last visit.   Arthritis - continued low back pain, radiation down the right side, average pain 10/10 (Norco brings pain down to 5/10), taking 2-4 times per day. No fevers, no bladder/bowel incontinence  Social - nonsmoker HM - unsure when last mammogram was, interested in mammogram.  Review of Systems  Constitutional: Negative for chills and fatigue.  Respiratory: Negative for cough and choking.   Cardiovascular: Negative for chest pain.  Gastrointestinal: Negative for nausea, vomiting and diarrhea.       Objective:   Physical Exam BP 117/60 mmHg  Pulse 79  Temp(Src) 98.7 F (37.1 C) (Oral)  Ht 5\' 4"  (1.626 m)  Wt 277 lb (125.646 kg)  BMI 47.52 kg/m2 Gen: pleasant female, NAD Cardiac: Irregular Irregular rhythm, no murmur, no heaves/thrills Resp: CTAB, normal effort Ext: trace edema MSK: right lumber paraspinal tenderness,no midline lumbar tenderness Skin Exam: see quality metrics for foot exam See quality metric exam  POC A1C 6.6 INR 2.3    Assessment & Plan:  Type 2 diabetes mellitus, controlled Controlled. A1C 6.7. -continue Metformin 500 mg daily -Patient to follow up with ophthalmologist -foot exam completed today  Atrial fibrillation Rate controlled. INR in range. -continue Metoprolol -continue Warfarin  HYPERTENSION, BENIGN SYSTEMIC Controlled. -no changes in therapy  today  Diabetic peripheral neuropathy Uncontrolled -increase Gabapentin to 600 mg TID  DDD (degenerative disc disease), lumbar Symptoms stable. -3 month supply of Norco provided -increased Gabapentin to 600 mg TID -if symptoms improved with new Gabapentin dose consider decreasing narcotic  Encounter for chronic pain management Three month supply of Norco provided. Reviewed [place] substance abuse reporting system. No red flags.   Preventative health care Gave handout on how to schedule mammogram.

## 2015-12-20 NOTE — Assessment & Plan Note (Signed)
Three month supply of Norco provided. Reviewed [place] substance abuse reporting system. No red flags.

## 2015-12-20 NOTE — Patient Instructions (Addendum)
It was nice to see you today.  Please schedule a mammogram.  Please follow up with your eye doctor.  DM - well controlled, continue Metformin 500 mg daily  Nerve/back pain - Increase Gabapentin to 600 mg three times per day. Refills of pain medications provided.  Return in 3 months for pain medication refills.

## 2015-12-20 NOTE — Assessment & Plan Note (Signed)
Uncontrolled -increase Gabapentin to 600 mg TID

## 2015-12-20 NOTE — Assessment & Plan Note (Signed)
Symptoms stable. -3 month supply of Norco provided -increased Gabapentin to 600 mg TID -if symptoms improved with new Gabapentin dose consider decreasing narcotic

## 2015-12-20 NOTE — Assessment & Plan Note (Signed)
Gave handout on how to schedule mammogram.

## 2016-01-06 ENCOUNTER — Other Ambulatory Visit: Payer: Self-pay | Admitting: Family Medicine

## 2016-01-09 ENCOUNTER — Other Ambulatory Visit: Payer: Self-pay | Admitting: Family Medicine

## 2016-01-10 ENCOUNTER — Other Ambulatory Visit: Payer: Self-pay | Admitting: Family Medicine

## 2016-01-21 ENCOUNTER — Other Ambulatory Visit: Payer: Self-pay | Admitting: Family Medicine

## 2016-01-28 ENCOUNTER — Ambulatory Visit: Payer: Medicare Other

## 2016-01-31 ENCOUNTER — Ambulatory Visit: Payer: Medicare Other

## 2016-02-03 ENCOUNTER — Other Ambulatory Visit: Payer: Self-pay | Admitting: Family Medicine

## 2016-02-04 ENCOUNTER — Ambulatory Visit (INDEPENDENT_AMBULATORY_CARE_PROVIDER_SITE_OTHER): Payer: Medicare Other | Admitting: *Deleted

## 2016-02-04 DIAGNOSIS — Z7901 Long term (current) use of anticoagulants: Secondary | ICD-10-CM

## 2016-02-04 DIAGNOSIS — I4819 Other persistent atrial fibrillation: Secondary | ICD-10-CM

## 2016-02-04 DIAGNOSIS — I481 Persistent atrial fibrillation: Secondary | ICD-10-CM

## 2016-02-04 LAB — POCT INR: INR: 2.9

## 2016-02-26 ENCOUNTER — Other Ambulatory Visit: Payer: Self-pay | Admitting: Family Medicine

## 2016-03-05 ENCOUNTER — Other Ambulatory Visit: Payer: Self-pay | Admitting: *Deleted

## 2016-03-05 NOTE — Telephone Encounter (Signed)
Refill request for 90 day supply.  Mekel Haverstock L, RN  

## 2016-03-08 ENCOUNTER — Other Ambulatory Visit: Payer: Self-pay | Admitting: Family Medicine

## 2016-03-09 NOTE — Telephone Encounter (Signed)
Request for 90 day supply. Martin, Tamika L, RN  

## 2016-03-10 ENCOUNTER — Other Ambulatory Visit: Payer: Self-pay | Admitting: *Deleted

## 2016-03-10 ENCOUNTER — Ambulatory Visit: Payer: Medicare Other

## 2016-03-11 MED ORDER — FUROSEMIDE 20 MG PO TABS: 20.0000 mg | ORAL_TABLET | Freq: Every day | ORAL | Status: DC

## 2016-03-16 ENCOUNTER — Other Ambulatory Visit: Payer: Self-pay | Admitting: Family Medicine

## 2016-03-20 ENCOUNTER — Ambulatory Visit (INDEPENDENT_AMBULATORY_CARE_PROVIDER_SITE_OTHER): Payer: Medicare Other | Admitting: *Deleted

## 2016-03-20 ENCOUNTER — Encounter: Payer: Self-pay | Admitting: Family Medicine

## 2016-03-20 ENCOUNTER — Ambulatory Visit (INDEPENDENT_AMBULATORY_CARE_PROVIDER_SITE_OTHER): Payer: Medicare Other | Admitting: Family Medicine

## 2016-03-20 ENCOUNTER — Other Ambulatory Visit: Payer: Self-pay | Admitting: Family Medicine

## 2016-03-20 VITALS — BP 122/67 | HR 84 | Temp 98.3°F | Ht 64.0 in | Wt 280.2 lb

## 2016-03-20 DIAGNOSIS — E119 Type 2 diabetes mellitus without complications: Secondary | ICD-10-CM

## 2016-03-20 DIAGNOSIS — Z7901 Long term (current) use of anticoagulants: Secondary | ICD-10-CM | POA: Diagnosis not present

## 2016-03-20 DIAGNOSIS — I4819 Other persistent atrial fibrillation: Secondary | ICD-10-CM

## 2016-03-20 DIAGNOSIS — M51369 Other intervertebral disc degeneration, lumbar region without mention of lumbar back pain or lower extremity pain: Secondary | ICD-10-CM

## 2016-03-20 DIAGNOSIS — I481 Persistent atrial fibrillation: Secondary | ICD-10-CM | POA: Diagnosis not present

## 2016-03-20 DIAGNOSIS — M5136 Other intervertebral disc degeneration, lumbar region: Secondary | ICD-10-CM | POA: Diagnosis not present

## 2016-03-20 DIAGNOSIS — Z7189 Other specified counseling: Secondary | ICD-10-CM

## 2016-03-20 DIAGNOSIS — G8929 Other chronic pain: Secondary | ICD-10-CM

## 2016-03-20 LAB — POCT GLYCOSYLATED HEMOGLOBIN (HGB A1C): HEMOGLOBIN A1C: 6.7

## 2016-03-20 LAB — TSH: TSH: 3.58 mIU/L

## 2016-03-20 LAB — POCT INR: INR: 2.3

## 2016-03-20 MED ORDER — HYDROCODONE-ACETAMINOPHEN 5-325 MG PO TABS: 1.0000 | ORAL_TABLET | Freq: Four times a day (QID) | ORAL | Status: DC | PRN

## 2016-03-20 NOTE — Patient Instructions (Signed)
It was nice to see you today.  Pain Control - you have been given refills of your prescriptions. Please return in 3 months for a check.  Please get you mammogram and eye exam.  Dr. Ree Kida will call you with your lab results.

## 2016-03-20 NOTE — Assessment & Plan Note (Signed)
Rate controlled. No symptoms. -continue Lopressor

## 2016-03-20 NOTE — Progress Notes (Signed)
   Subjective:    Patient ID: Abigail Duncan, female    DOB: 1941-10-01, 75 y.o.   MRN: [id number]  HPI 75 y/o female presents for routine follow up.  Chronic Pain/Lumbar DDD - taking Norco 5-325 Q 6 hours prn pain, pain is 4/10 on medication, 8/10 without narcotics, last dose of Norco was at 9 AM today. Patient completed Pain Documentation Assessment Tool (scanned into chart).   Afib/chronic anticoagulation - no chest pain, no palpitation, occasional sob with exertion that is unchanged, no lightheadedness, no vertigo  HM - due for eye exam and mammogram (has upcoming appointments)  Review of Systems No nausea, vomiting, constipation, fatigue, sweating    Objective:   Physical Exam BP 122/67 mmHg  Pulse 84  Temp(Src) 98.3 F (36.8 C) (Oral)  Ht 5\' 4"  (1.626 m)  Wt 280 lb 3.2 oz (127.098 kg)  BMI 48.07 kg/m2  Gen: pleasant female, NAD Cardiac: RRR, S1 and S2 present, no murmur Resp: CTAB, normal effort MSK: Lumbar - right paraspinal tenderness, no midline tenderness, tenderness extends into right buttock, negative modified slump test bilaterally   POC INR 2.3     Assessment & Plan:  Atrial fibrillation Rate controlled. No symptoms. -continue Lopressor  Long term current use of anticoagulant therapy INR therapeutic.  -continue current Warfarin dose  Encounter for chronic pain management Lumbar DDD stable. Completed PADT (scanned into chart). Patient continues to benefit from therapy. -3 month supply of Norco provided -UDS obtained today (last dose of narcotic was at 9 AM today)  DDD (degenerative disc disease), lumbar Symptoms. Continues to have mild-moderate radicular symptoms down right LE that are stable. No red flag signs/symptoms.  -refill of Norco provided

## 2016-03-20 NOTE — Assessment & Plan Note (Signed)
Symptoms. Continues to have mild-moderate radicular symptoms down right LE that are stable. No red flag signs/symptoms.  -refill of Norco provided

## 2016-03-20 NOTE — Progress Notes (Deleted)
Subjective:     Patient ID: TORI HOSICK, female   DOB: 03-26-1941, 75 y.o.   MRN: [id number]  HPI Taking medicine every day.   Too Hydrocodone around 9 am today. Helping with pain is manageable. Right side down hip and leg. 4/10 on medicine. 8/10 without medicine.   No chest pain. No headaches. No pain with urination. Gets SOB some times when she walks. Feels like she can't catch your breath. Hasn't felt like falling down. No dizziness. No loss of vision. Otherwise doing pretty well.   Warfarin 1/2 tablet 3 days. 1 tablet 4 days. No tenderness or increased swelling in legs.   Still has sensation in toes. Slight edema but much better on fluid pills.  Needs pain medicine refill.   Review of Systems     Objective:   Physical Exam     Assessment:     ***    Plan:     ***

## 2016-03-20 NOTE — Assessment & Plan Note (Signed)
Lumbar DDD stable. Completed PADT (scanned into chart). Patient continues to benefit from therapy. -3 month supply of Norco provided -UDS obtained today (last dose of narcotic was at 9 AM today)

## 2016-03-20 NOTE — Assessment & Plan Note (Signed)
INR therapeutic.  -continue current Warfarin dose

## 2016-03-21 LAB — COMPLETE METABOLIC PANEL WITH GFR
ALT: 10 U/L (ref 6–29)
AST: 15 U/L (ref 10–35)
Albumin: 4.1 g/dL (ref 3.6–5.1)
Alkaline Phosphatase: 61 U/L (ref 33–130)
BILIRUBIN TOTAL: 0.5 mg/dL (ref 0.2–1.2)
BUN: 42 mg/dL — AB (ref 7–25)
CALCIUM: 9.2 mg/dL (ref 8.6–10.4)
CHLORIDE: 99 mmol/L (ref 98–110)
CO2: 18 mmol/L — ABNORMAL LOW (ref 20–31)
CREATININE: 1.5 mg/dL — AB (ref 0.60–0.93)
GFR, Est African American: 40 mL/min — ABNORMAL LOW (ref >=60)
GFR, Est Non African American: 35 mL/min — ABNORMAL LOW (ref >=60)
Glucose, Bld: 126 mg/dL — ABNORMAL HIGH (ref 65–99)
Potassium: 4.6 mmol/L (ref 3.5–5.3)
Sodium: 139 mmol/L (ref 135–146)
TOTAL PROTEIN: 7.3 g/dL (ref 6.1–8.1)

## 2016-03-21 LAB — CBC WITH DIFFERENTIAL/PLATELET
BASOS ABS: 0 {cells}/uL (ref 0–200)
BASOS PCT: 0 %
EOS ABS: 560 {cells}/uL — AB (ref 15–500)
Eosinophils Relative: 4 %
HEMATOCRIT: 38.5 % (ref 35.0–45.0)
HEMOGLOBIN: 12.6 g/dL (ref 11.7–15.5)
LYMPHS ABS: 5600 {cells}/uL — AB (ref 850–3900)
Lymphocytes Relative: 40 %
MCH: 29.1 pg (ref 27.0–33.0)
MCHC: 32.7 g/dL (ref 32.0–36.0)
MCV: 88.9 fL (ref 80.0–100.0)
MONO ABS: 1120 {cells}/uL — AB (ref 200–950)
MONOS PCT: 8 %
MPV: 10.6 fL (ref 7.5–12.5)
NEUTROS ABS: 6720 {cells}/uL (ref 1500–7800)
Neutrophils Relative %: 48 %
PLATELETS: 370 10*3/uL (ref 140–400)
RBC: 4.33 MIL/uL (ref 3.80–5.10)
RDW: 15.8 % — ABNORMAL HIGH (ref 11.0–15.0)
WBC: 14 10*3/uL — ABNORMAL HIGH (ref 3.8–10.8)

## 2016-03-21 LAB — LIPID PANEL
CHOLESTEROL: 217 mg/dL — AB (ref 125–200)
HDL: 42 mg/dL — ABNORMAL LOW (ref >=46)
Total CHOL/HDL Ratio: 5.2 Ratio — ABNORMAL HIGH (ref <=5.0)
Triglycerides: 445 mg/dL — ABNORMAL HIGH (ref <150)

## 2016-03-23 LAB — PAIN MGMT, PROFILE 5 W/CONF, U
Amphetamines: NEGATIVE ng/mL (ref <500)
BARBITURATES: NEGATIVE ng/mL (ref <300)
BENZODIAZEPINES: NEGATIVE ng/mL (ref <100)
COCAINE METABOLITE: NEGATIVE ng/mL (ref <150)
Codeine: NEGATIVE ng/mL (ref <50)
Creatinine: 149.1 mg/dL (ref >=20.0)
HYDROMORPHONE: 201 ng/mL — AB (ref <50)
Hydrocodone: 2141 ng/mL — ABNORMAL HIGH (ref <50)
METHADONE METABOLITE: NEGATIVE ng/mL (ref <100)
MORPHINE: NEGATIVE ng/mL (ref <50)
Marijuana Metabolite: NEGATIVE ng/mL (ref <20)
NORHYDROCODONE: 3308 ng/mL — AB (ref <50)
OPIATES: POSITIVE ng/mL — AB (ref <100)
Oxidant: NEGATIVE ug/mL (ref <200)
Oxycodone: NEGATIVE ng/mL (ref <100)
pH: 5.12 (ref 4.5–9.0)

## 2016-03-24 LAB — PATHOLOGIST SMEAR REVIEW

## 2016-03-25 ENCOUNTER — Telehealth: Payer: Self-pay | Admitting: Family Medicine

## 2016-03-25 DIAGNOSIS — D72829 Elevated white blood cell count, unspecified: Secondary | ICD-10-CM

## 2016-03-25 DIAGNOSIS — R7989 Other specified abnormal findings of blood chemistry: Secondary | ICD-10-CM

## 2016-03-25 DIAGNOSIS — N183 Chronic kidney disease, stage 3 unspecified: Secondary | ICD-10-CM

## 2016-03-25 NOTE — Telephone Encounter (Signed)
Discussed lab results with patient. Narcotic screen unremarkable, TSH normal, Cr elevated to 1.5, leukocytosis on WBC. Will schedule recheck of labs next week.  RN staff - please schedule lab draw in 1-2 weeks.

## 2016-03-25 NOTE — Telephone Encounter (Signed)
Attempted to contact patient with lab results. No answer. Left message that I would cal again later today.

## 2016-03-31 ENCOUNTER — Other Ambulatory Visit: Payer: Self-pay | Admitting: Family Medicine

## 2016-04-06 ENCOUNTER — Other Ambulatory Visit: Payer: Self-pay | Admitting: Family Medicine

## 2016-04-17 ENCOUNTER — Other Ambulatory Visit: Payer: Self-pay | Admitting: Family Medicine

## 2016-04-24 ENCOUNTER — Ambulatory Visit: Payer: Medicare Other

## 2016-04-27 ENCOUNTER — Ambulatory Visit (INDEPENDENT_AMBULATORY_CARE_PROVIDER_SITE_OTHER): Payer: Medicare Other | Admitting: *Deleted

## 2016-04-27 DIAGNOSIS — N183 Chronic kidney disease, stage 3 unspecified: Secondary | ICD-10-CM

## 2016-04-27 DIAGNOSIS — I481 Persistent atrial fibrillation: Secondary | ICD-10-CM

## 2016-04-27 DIAGNOSIS — Z7901 Long term (current) use of anticoagulants: Secondary | ICD-10-CM | POA: Diagnosis not present

## 2016-04-27 DIAGNOSIS — D72829 Elevated white blood cell count, unspecified: Secondary | ICD-10-CM

## 2016-04-27 DIAGNOSIS — I4819 Other persistent atrial fibrillation: Secondary | ICD-10-CM

## 2016-04-27 LAB — CBC WITH DIFFERENTIAL/PLATELET
BASOS ABS: 0 {cells}/uL (ref 0–200)
BASOS PCT: 0 %
EOS ABS: 279 {cells}/uL (ref 15–500)
Eosinophils Relative: 3 %
HEMATOCRIT: 37.6 % (ref 35.0–45.0)
HEMOGLOBIN: 12.6 g/dL (ref 11.7–15.5)
LYMPHS ABS: 2697 {cells}/uL (ref 850–3900)
Lymphocytes Relative: 29 %
MCH: 28.7 pg (ref 27.0–33.0)
MCHC: 33.5 g/dL (ref 32.0–36.0)
MCV: 85.6 fL (ref 80.0–100.0)
MONO ABS: 744 {cells}/uL (ref 200–950)
MONOS PCT: 8 %
MPV: 10.1 fL (ref 7.5–12.5)
NEUTROS ABS: 5580 {cells}/uL (ref 1500–7800)
Neutrophils Relative %: 60 %
PLATELETS: 330 10*3/uL (ref 140–400)
RBC: 4.39 MIL/uL (ref 3.80–5.10)
RDW: 15.6 % — ABNORMAL HIGH (ref 11.0–15.0)
WBC: 9.3 10*3/uL (ref 3.8–10.8)

## 2016-04-27 LAB — COMPLETE METABOLIC PANEL WITH GFR
ALBUMIN: 3.8 g/dL (ref 3.6–5.1)
ALK PHOS: 67 U/L (ref 33–130)
ALT: 9 U/L (ref 6–29)
AST: 13 U/L (ref 10–35)
BILIRUBIN TOTAL: 0.5 mg/dL (ref 0.2–1.2)
BUN: 34 mg/dL — ABNORMAL HIGH (ref 7–25)
CO2: 22 mmol/L (ref 20–31)
CREATININE: 1.68 mg/dL — AB (ref 0.60–0.93)
Calcium: 8.9 mg/dL (ref 8.6–10.4)
Chloride: 100 mmol/L (ref 98–110)
GFR, EST AFRICAN AMERICAN: 34 mL/min — AB (ref >=60)
GFR, Est Non African American: 30 mL/min — ABNORMAL LOW (ref >=60)
Glucose, Bld: 113 mg/dL — ABNORMAL HIGH (ref 65–99)
Potassium: 4.3 mmol/L (ref 3.5–5.3)
Sodium: 136 mmol/L (ref 135–146)
TOTAL PROTEIN: 6.7 g/dL (ref 6.1–8.1)

## 2016-04-27 LAB — POCT INR: INR: 2.7

## 2016-04-28 ENCOUNTER — Telehealth: Payer: Self-pay | Admitting: Family Medicine

## 2016-04-28 DIAGNOSIS — N183 Chronic kidney disease, stage 3 unspecified: Secondary | ICD-10-CM

## 2016-04-28 NOTE — Telephone Encounter (Signed)
Contacted patient, discussed labs and referrals.

## 2016-04-28 NOTE — Telephone Encounter (Signed)
Attempted to contact patient about lab results. Leukocytosis has resolved. Cr continues to increase (GFR now in 30's). Placed order for renal US and referral to Nephrology. Will call patient again later this morning to inform her of results.

## 2016-05-06 ENCOUNTER — Telehealth: Payer: Self-pay | Admitting: Family Medicine

## 2016-05-06 ENCOUNTER — Ambulatory Visit (HOSPITAL_COMMUNITY)
Admission: RE | Admit: 2016-05-06 | Discharge: 2016-05-06 | Disposition: A | Discharge status: Home / Self Care | Payer: Medicare Other | Source: Ambulatory Visit | Attending: Family Medicine | Admitting: Family Medicine

## 2016-05-06 DIAGNOSIS — N183 Chronic kidney disease, stage 3 unspecified: Secondary | ICD-10-CM

## 2016-05-06 DIAGNOSIS — N261 Atrophy of kidney (terminal): Secondary | ICD-10-CM | POA: Insufficient documentation

## 2016-05-06 DIAGNOSIS — N281 Cyst of kidney, acquired: Secondary | ICD-10-CM | POA: Insufficient documentation

## 2016-05-06 DIAGNOSIS — N2889 Other specified disorders of kidney and ureter: Secondary | ICD-10-CM | POA: Diagnosis not present

## 2016-05-06 NOTE — Telephone Encounter (Signed)
Attempted on contact patient with renal US results. Did not leave message. Will call again at later time.

## 2016-05-07 NOTE — Telephone Encounter (Signed)
Called patient. No answer. Left message that I would attempt to call again.

## 2016-05-12 ENCOUNTER — Telehealth: Payer: Self-pay | Admitting: Family Medicine

## 2016-05-12 ENCOUNTER — Telehealth: Payer: Self-pay | Admitting: *Deleted

## 2016-05-12 DIAGNOSIS — N200 Calculus of kidney: Secondary | ICD-10-CM

## 2016-05-12 HISTORY — DX: Calculus of kidney: N20.0

## 2016-05-12 NOTE — Telephone Encounter (Signed)
Patient returning MD call regarding ultrasound results.

## 2016-05-12 NOTE — Telephone Encounter (Signed)
Called and discussed renal US findings. She does have a nonobstructing right renal stone that I will refer her to Urology for further evaluation. She also notes that she has not received a call to schedule Nephrology appointment (prior referral placed). I will forward this message to our referral coordinator to address.

## 2016-05-12 NOTE — Assessment & Plan Note (Signed)
6 mm right renal stone. Referral to Urology.

## 2016-05-13 NOTE — Telephone Encounter (Signed)
Please note that Nephrology referrals take up to 2-6 months depending on the severity of  Pt's condition. Referral sent on 7/25 and we will receive a fax back from [facility] pt rating of 1-4. They will then contact pt in the time frame.

## 2016-05-22 DIAGNOSIS — N200 Calculus of kidney: Secondary | ICD-10-CM | POA: Diagnosis not present

## 2016-06-02 ENCOUNTER — Ambulatory Visit (INDEPENDENT_AMBULATORY_CARE_PROVIDER_SITE_OTHER): Payer: Medicare Other | Admitting: *Deleted

## 2016-06-02 DIAGNOSIS — Z7901 Long term (current) use of anticoagulants: Secondary | ICD-10-CM

## 2016-06-02 DIAGNOSIS — I481 Persistent atrial fibrillation: Secondary | ICD-10-CM | POA: Diagnosis not present

## 2016-06-02 DIAGNOSIS — I4819 Other persistent atrial fibrillation: Secondary | ICD-10-CM

## 2016-06-02 LAB — POCT INR: INR: 3.2

## 2016-06-11 ENCOUNTER — Other Ambulatory Visit: Payer: Self-pay | Admitting: Family Medicine

## 2016-06-15 NOTE — Progress Notes (Signed)
   Subjective:    Patient ID: Abigail Duncan, female    DOB: 08-08-1941, 75 y.o.   MRN: [id number]  HPI 75 y/o female presents for routine follow up.  Chronic Pain/Lumbar DDD Taking Norco 5-325 Q 6 hours prn pain, takes 3-4 per day, still having right back pain that radiates to buttock, average pain is 5/10, able to move around better with medication, no bladder/bowel incontinence, no LE weakness, no fevers/chills  DM Currently on Metformin 500 mg daily. Also taking Gabapentin for neuropathic pain (controled with 600 mg TID). Sister checks (unsure of values). Time for eye appointment.   Elevated Cr  Referred to Urology at last visit for right renal stone (plan to monitor).    HTN Currently on Nifedipine 90 mg daily, Enalapril 10 mg daily, Enalapril-HCTZ 10-25 mg daily, and Metoprolol 100 mg BID.  No lightheadedness, no dizziness, no chest pain, no vision changes, no headaches.   Urine leakage Not with cough/sneeze, does have some urge and can't make it to the restroom  HM Due for flu vaccine, mammogram, and eye exam.    Review of Systems  Constitutional: Negative for chills, diaphoresis and fatigue.  Respiratory: Negative for cough, choking and shortness of breath.   Cardiovascular: Negative for chest pain.  Gastrointestinal: Negative for constipation, diarrhea and vomiting.       Objective:   Physical Exam BP 124/67   Pulse 77   Temp 98 F (36.7 C) (Oral)   Ht 5\' 4"  (1.626 m)   Wt 285 lb (129.3 kg)   BMI 48.92 kg/m  Gen: pleasant female, NAD Cardiac: RRR, S1 and S1 present, no murmur Resp: CTAB, normal effort MSK: lumbar - some right paraspinal tenderness, negative slump test bilaterally Ext: 1+ edema both LE to mid shin  UDS 03/2016 consistent with current regimen.  Cr 1.68 on last CMP on 04/27/16    Assessment & Plan:  Type 2 diabetes mellitus, controlled Controlled. A1C 6.8 -continue Metformin 500 mg daily -encouraged yearly eye exam  HYPERTENSION, BENIGN  SYSTEMIC Controlled per JNC[facility] -no change in regimen  DDD (degenerative disc disease), lumbar Stable Lumbar DDD with mild radicular symptoms. Function/Pain improved with PRN Norco -refills of Norco provided for 3 months  INCONTINENCE, URGE Patient has symptoms consistent with urge incontinence. Not interested in pharmacotherapy at this time.  -trial of Kegel exercises  Encounter for chronic pain management Function/pain improved with prn Norco. No red flags on recent UDS. -3 month refill of Norco provided  Chronic kidney disease (CKD), stage III (moderate) Cr slightly elevated to 1.68 at last visit. -recheck BMP today.

## 2016-06-18 ENCOUNTER — Ambulatory Visit (INDEPENDENT_AMBULATORY_CARE_PROVIDER_SITE_OTHER): Payer: Medicare Other | Admitting: *Deleted

## 2016-06-18 ENCOUNTER — Encounter: Payer: Self-pay | Admitting: Family Medicine

## 2016-06-18 ENCOUNTER — Ambulatory Visit (INDEPENDENT_AMBULATORY_CARE_PROVIDER_SITE_OTHER): Payer: Medicare Other | Admitting: Family Medicine

## 2016-06-18 VITALS — BP 124/67 | HR 77 | Temp 98.0°F | Ht 64.0 in | Wt 285.0 lb

## 2016-06-18 DIAGNOSIS — N183 Chronic kidney disease, stage 3 unspecified: Secondary | ICD-10-CM

## 2016-06-18 DIAGNOSIS — Z23 Encounter for immunization: Secondary | ICD-10-CM | POA: Diagnosis not present

## 2016-06-18 DIAGNOSIS — Z7189 Other specified counseling: Secondary | ICD-10-CM

## 2016-06-18 DIAGNOSIS — M5136 Other intervertebral disc degeneration, lumbar region: Secondary | ICD-10-CM

## 2016-06-18 DIAGNOSIS — I10 Essential (primary) hypertension: Secondary | ICD-10-CM

## 2016-06-18 DIAGNOSIS — Z7901 Long term (current) use of anticoagulants: Secondary | ICD-10-CM

## 2016-06-18 DIAGNOSIS — I4819 Other persistent atrial fibrillation: Secondary | ICD-10-CM

## 2016-06-18 DIAGNOSIS — I481 Persistent atrial fibrillation: Secondary | ICD-10-CM | POA: Diagnosis not present

## 2016-06-18 DIAGNOSIS — E119 Type 2 diabetes mellitus without complications: Secondary | ICD-10-CM

## 2016-06-18 DIAGNOSIS — G8929 Other chronic pain: Secondary | ICD-10-CM

## 2016-06-18 DIAGNOSIS — N3941 Urge incontinence: Secondary | ICD-10-CM

## 2016-06-18 LAB — POCT GLYCOSYLATED HEMOGLOBIN (HGB A1C): HEMOGLOBIN A1C: 6.8

## 2016-06-18 LAB — POCT INR: INR: 3

## 2016-06-18 MED ORDER — HYDROCODONE-ACETAMINOPHEN 5-325 MG PO TABS: 1.0000 | ORAL_TABLET | Freq: Four times a day (QID) | ORAL | 0 refills | Status: DC | PRN

## 2016-06-18 NOTE — Patient Instructions (Signed)
It was nice to see you today.  Blood pressure - well controlled, continue same medications  Diabetes - well controlled, continue Metformin, get your eye exam  Urine Leakage - trial of kegal exercises (handout given)  Please get your mammogram  Refill of pain medications provided.

## 2016-06-19 LAB — BASIC METABOLIC PANEL WITH GFR
BUN: 39 mg/dL — AB (ref 7–25)
CALCIUM: 8.8 mg/dL (ref 8.6–10.4)
CHLORIDE: 100 mmol/L (ref 98–110)
CO2: 22 mmol/L (ref 20–31)
CREATININE: 1.85 mg/dL — AB (ref 0.60–0.93)
GFR, EST AFRICAN AMERICAN: 31 mL/min — AB (ref >=60)
GFR, Est Non African American: 27 mL/min — ABNORMAL LOW (ref >=60)
Glucose, Bld: 141 mg/dL — ABNORMAL HIGH (ref 65–99)
Potassium: 4.4 mmol/L (ref 3.5–5.3)
Sodium: 136 mmol/L (ref 135–146)

## 2016-06-19 NOTE — Assessment & Plan Note (Signed)
Patient has symptoms consistent with urge incontinence. Not interested in pharmacotherapy at this time.  -trial of Kegel exercises

## 2016-06-19 NOTE — Assessment & Plan Note (Signed)
Cr slightly elevated to 1.68 at last visit. -recheck BMP today.

## 2016-06-19 NOTE — Assessment & Plan Note (Signed)
Stable Lumbar DDD with mild radicular symptoms. Function/Pain improved with PRN Norco -refills of Norco provided for 3 months

## 2016-06-19 NOTE — Assessment & Plan Note (Signed)
Controlled per [facility] -no change in regimen

## 2016-06-19 NOTE — Assessment & Plan Note (Signed)
Controlled. A1C 6.8 -continue Metformin 500 mg daily -encouraged yearly eye exam

## 2016-06-19 NOTE — Assessment & Plan Note (Signed)
Function/pain improved with prn Norco. No red flags on recent UDS. -3 month refill of Norco provided

## 2016-06-22 ENCOUNTER — Telehealth: Payer: Self-pay | Admitting: Family Medicine

## 2016-06-22 DIAGNOSIS — N183 Chronic kidney disease, stage 3 unspecified: Secondary | ICD-10-CM

## 2016-06-22 NOTE — Assessment & Plan Note (Signed)
Called patient and discussed worsening Cr. She denies taking NSAID's. Will stop Enalapril 10 mg (continue Enalapril 10-HCTZ 25 mg, lasix 20 mg daily, and metoprolol 100 mg BID).   Previously sent to nephrology (has not received appointment). Will send message to referral coordinator to check on status of referral.

## 2016-06-22 NOTE — Telephone Encounter (Signed)
Called patient and discussed worsening Cr. She denies taking NSAID's. Will stop Enalapril 10 mg (continue Enalapril 10-HCTZ 25 mg, lasix 20 mg daily, and metoprolol 100 mg BID).   Previously sent to nephrology (has not received appointment). Will send message to referral coordinator to check on status of referral.

## 2016-06-23 NOTE — Telephone Encounter (Signed)
Looks like this referral was done while I was out of the office in July. Called [facility] and was told that the referral was never received. Will re-do referral send referral again.

## 2016-06-25 ENCOUNTER — Other Ambulatory Visit: Payer: Self-pay | Admitting: Family Medicine

## 2016-07-05 ENCOUNTER — Other Ambulatory Visit: Payer: Self-pay | Admitting: Family Medicine

## 2016-07-08 DIAGNOSIS — I10 Essential (primary) hypertension: Secondary | ICD-10-CM | POA: Diagnosis not present

## 2016-07-08 DIAGNOSIS — N183 Chronic kidney disease, stage 3 (moderate): Secondary | ICD-10-CM | POA: Diagnosis not present

## 2016-07-08 DIAGNOSIS — N2581 Secondary hyperparathyroidism of renal origin: Secondary | ICD-10-CM | POA: Diagnosis not present

## 2016-07-08 DIAGNOSIS — N179 Acute kidney failure, unspecified: Secondary | ICD-10-CM | POA: Diagnosis not present

## 2016-07-08 DIAGNOSIS — D631 Anemia in chronic kidney disease: Secondary | ICD-10-CM | POA: Diagnosis not present

## 2016-07-23 ENCOUNTER — Ambulatory Visit (INDEPENDENT_AMBULATORY_CARE_PROVIDER_SITE_OTHER): Payer: Medicare Other | Admitting: *Deleted

## 2016-07-23 DIAGNOSIS — Z7901 Long term (current) use of anticoagulants: Secondary | ICD-10-CM

## 2016-07-23 DIAGNOSIS — I481 Persistent atrial fibrillation: Secondary | ICD-10-CM

## 2016-07-23 LAB — POCT INR: INR: 2.4

## 2016-08-10 DIAGNOSIS — N183 Chronic kidney disease, stage 3 (moderate): Secondary | ICD-10-CM | POA: Diagnosis not present

## 2016-08-17 DIAGNOSIS — I10 Essential (primary) hypertension: Secondary | ICD-10-CM | POA: Diagnosis not present

## 2016-08-17 DIAGNOSIS — N183 Chronic kidney disease, stage 3 (moderate): Secondary | ICD-10-CM | POA: Diagnosis not present

## 2016-08-17 DIAGNOSIS — D631 Anemia in chronic kidney disease: Secondary | ICD-10-CM | POA: Diagnosis not present

## 2016-08-17 DIAGNOSIS — N2581 Secondary hyperparathyroidism of renal origin: Secondary | ICD-10-CM | POA: Diagnosis not present

## 2016-08-24 ENCOUNTER — Other Ambulatory Visit: Payer: Self-pay | Admitting: Family Medicine

## 2016-08-31 ENCOUNTER — Other Ambulatory Visit: Payer: Self-pay | Admitting: Family Medicine

## 2016-08-31 NOTE — Telephone Encounter (Signed)
Needs refill on hyrdocodone. Has an appt jan 4. Will be in office thur nov 3- for coumadin check.

## 2016-09-01 MED ORDER — HYDROCODONE-ACETAMINOPHEN 5-325 MG PO TABS: 1.0000 | ORAL_TABLET | Freq: Four times a day (QID) | ORAL | 0 refills | Status: DC | PRN

## 2016-09-01 NOTE — Telephone Encounter (Signed)
RN staff - please call patient and inform her that I have printed a one month supply of her narcotic medication, I have placed in front office for pickup, remind her that she needs to continue to schedule appointments every 3 months to get narcotic prescriptions refilled.

## 2016-09-01 NOTE — Telephone Encounter (Signed)
Pt informed and she has scheduled an appt for January since you are booked in December. Nimai Burbach Kennon Holter, CMA

## 2016-09-02 ENCOUNTER — Other Ambulatory Visit: Payer: Self-pay | Admitting: Family Medicine

## 2016-09-03 ENCOUNTER — Ambulatory Visit (INDEPENDENT_AMBULATORY_CARE_PROVIDER_SITE_OTHER): Payer: Medicare Other | Admitting: *Deleted

## 2016-09-03 DIAGNOSIS — I481 Persistent atrial fibrillation: Secondary | ICD-10-CM

## 2016-09-03 DIAGNOSIS — Z7901 Long term (current) use of anticoagulants: Secondary | ICD-10-CM | POA: Diagnosis not present

## 2016-09-03 LAB — POCT INR: INR: 2.4

## 2016-09-10 ENCOUNTER — Other Ambulatory Visit: Payer: Self-pay | Admitting: Family Medicine

## 2016-09-11 ENCOUNTER — Other Ambulatory Visit: Payer: Self-pay | Admitting: Family Medicine

## 2016-10-07 NOTE — Progress Notes (Deleted)
   Subjective:    Patient ID: Abigail Duncan, female    DOB: 01-Jan-1941, 76 y.o.   MRN: [id number]  HPI 76 y/o female presents for routine follow up.   Chronic Pain/Lumbar DDD Taking Norco 5-325 Q 6 hours prn pain, takes 3-4 per day  DM/Peripheral Neuropathy Currently on Metformin 500 mg daily. Also taking Gabapentin for neuropathic pain (controled with 600 mg TID)  HTN Currently on Nifedipine 90 mg daily, Enalapril 10 mg daily, Enalapril-HCTZ 10-25 mg daily, and Metoprolol 100 mg BID  CAD/Atrial Fibrillation Currently on Warfarin for stroke prevention  HM Due for Mammogram and Eye exam Review of Systems     Objective:   Physical Exam There were no vitals taken for this visit.        Assessment & Plan:  No problem-specific Assessment & Plan notes found for this encounter.

## 2016-10-08 ENCOUNTER — Ambulatory Visit: Payer: Medicare Other | Admitting: Family Medicine

## 2016-10-16 ENCOUNTER — Ambulatory Visit (INDEPENDENT_AMBULATORY_CARE_PROVIDER_SITE_OTHER): Payer: Medicare Other | Admitting: Family Medicine

## 2016-10-16 VITALS — BP 130/70 | HR 81 | Temp 98.1°F | Ht 64.0 in | Wt 290.0 lb

## 2016-10-16 DIAGNOSIS — E119 Type 2 diabetes mellitus without complications: Secondary | ICD-10-CM | POA: Diagnosis not present

## 2016-10-16 DIAGNOSIS — E1142 Type 2 diabetes mellitus with diabetic polyneuropathy: Secondary | ICD-10-CM

## 2016-10-16 DIAGNOSIS — R0602 Shortness of breath: Secondary | ICD-10-CM

## 2016-10-16 DIAGNOSIS — N3941 Urge incontinence: Secondary | ICD-10-CM | POA: Diagnosis not present

## 2016-10-16 DIAGNOSIS — I481 Persistent atrial fibrillation: Secondary | ICD-10-CM

## 2016-10-16 DIAGNOSIS — I10 Essential (primary) hypertension: Secondary | ICD-10-CM

## 2016-10-16 DIAGNOSIS — Z7901 Long term (current) use of anticoagulants: Secondary | ICD-10-CM

## 2016-10-16 DIAGNOSIS — G8929 Other chronic pain: Secondary | ICD-10-CM

## 2016-10-16 DIAGNOSIS — I4819 Other persistent atrial fibrillation: Secondary | ICD-10-CM

## 2016-10-16 DIAGNOSIS — M5136 Other intervertebral disc degeneration, lumbar region: Secondary | ICD-10-CM

## 2016-10-16 HISTORY — DX: Shortness of breath: R06.02

## 2016-10-16 LAB — POCT GLYCOSYLATED HEMOGLOBIN (HGB A1C): HEMOGLOBIN A1C: 6.7

## 2016-10-16 LAB — POCT INR: INR: 2.8

## 2016-10-16 MED ORDER — HYDROCODONE-ACETAMINOPHEN 5-325 MG PO TABS: 1.0000 | ORAL_TABLET | Freq: Four times a day (QID) | ORAL | 0 refills | Status: DC | PRN

## 2016-10-16 MED ORDER — MIRABEGRON ER 25 MG PO TB24: 25.0000 mg | ORAL_TABLET | Freq: Every day | ORAL | 2 refills | Status: DC

## 2016-10-16 MED ORDER — FUROSEMIDE 20 MG PO TABS: 20.0000 mg | ORAL_TABLET | Freq: Two times a day (BID) | ORAL | 1 refills | Status: DC

## 2016-10-16 NOTE — Assessment & Plan Note (Signed)
Controlled. No longer taking Enalapril 10 mg (took off medication list).  -continue Enalapril-HCTZ 10-12.5, Metoprolol 100 mg BID, and Procardia XL 90 mg daily

## 2016-10-16 NOTE — Assessment & Plan Note (Signed)
Lumbar back pain with right sided radicular symptoms. She reports intermittent urine incontinence. Otherwise no other red flag signs/symptoms.  -refill of Norco provided -continue Gabapentin -encouraged activity once shortness of breath improved -if patient has worsening of symptoms would consider repeat MRI lumbar spine.  -return precautions discussed

## 2016-10-16 NOTE — Assessment & Plan Note (Signed)
Rate Controlled on Metoprolol. Continue anticoagulation with Warfarin.

## 2016-10-16 NOTE — Patient Instructions (Signed)
It was nice to see you today.  Back pain - continue pain medication 2-3 times per day.  Shortness of breath - I think this is related to your heart, increase Lasix to 20 mg twice (breakfast and lunch). Return to see me in 2-3 weeks.   Diabetes - well controlled, continue metformin  Blood Pressure - well controlled  Urine leaking - start Myrbetric 25 mg

## 2016-10-16 NOTE — Assessment & Plan Note (Signed)
Three month history of shortness of breath, mostly with exertion, associated orthopnea. History of Afib and Pulm HTN. Weight is increased. -increase lasix to 20 mg BID -return visit in 2 weeks to reassess. If not improved consider repeat Echo.

## 2016-10-16 NOTE — Assessment & Plan Note (Signed)
Controlled on Gabapentin 600 mg TID.

## 2016-10-16 NOTE — Progress Notes (Signed)
   Subjective:    Patient ID: Abigail Duncan, female    DOB: 14-Jul-1941, 76 y.o.   MRN: [id number]  HPI 76 y/o female presents for routine follow up.  Shortness of Breath Present for a few months, mostly with exertion, no cough, no fevers/chills, no chest pain, two pillows (sometimes 3, has needed more recently), no PND  Atrial Fibrillation No palpitations, no chest pain, taking Coumadin, chronic swelling, takes lasix 20 mg tablet daily  Chronic Pain/Lumbar DDD Taking Norco 5-325 Q 6 hours prn pain, taking 2-3 tablets per day, back pain 10 without medication (down to 4/10 with medication), able to stand/ambulate better with the medications.   DM Taking Metformin 500 mg daily  Peripheral Neuropathy Taking Gabapentin 600 mg TID, stable  HTN Prescribed Enalapril-HCTZ 10-25 daily, Metoprolol 100 mg BID, Procardia XL 90 mg daily  Urine Leakage Given Kegal exercises last visit (does almost daily), still reports leakage of urine, sometimes with standing, also some urge incontinence, able to control most of the time.   HM Due for eye exam (scheduled Friday) and mammogram  Review of Systems  Constitutional: Negative for chills and fatigue.  Respiratory: Positive for shortness of breath. Negative for cough.   Cardiovascular: Positive for leg swelling. Negative for chest pain.  Gastrointestinal: Negative for diarrhea, nausea and vomiting.       Objective:   Physical Exam BP 130/70   Pulse 81   Temp 98.1 F (36.7 C) (Oral)   Ht 5\' 4"  (1.626 m)   Wt 290 lb (131.5 kg)   SpO2 97%   BMI 49.78 kg/m  Weight up to 290 (from 270's)  Gen: pleasant female, NAD, obese Cardiac: Irregular Irregular rate, S1 and S2 present, no murmur Resp: Slight crackles in bases, normal work of breathing MSK: walks with gain; Lumbar - midline and paraspinal tenderness; negative Slump test bilaterally Ext: 2+ edema of bilateral legs up to knee Neuro: CN 2-12 intact, strength 5/5 in all extremities,  sensation to light touch intact in all extremities  POC A1C 6.7 Echo 04/2015: EF 55-60%, atrial fibrillation    Assessment & Plan:  Type 2 diabetes mellitus, controlled Well controlled. A1C 6.7 -continue Metformin 500 mg daily -encouraged yearly eye exam  Diabetic peripheral neuropathy Controlled on Gabapentin 600 mg TID.   Atrial fibrillation Rate Controlled on Metoprolol. Continue anticoagulation with Warfarin.   Shortness of breath Three month history of shortness of breath, mostly with exertion, associated orthopnea. History of Afib and Pulm HTN. Weight is increased. -increase lasix to 20 mg BID -return visit in 2 weeks to reassess. If not improved consider repeat Echo.   Encounter for chronic pain management Stable symptoms on Norco and Gabapentin.  -3 month supply of Norco provided.   DDD (degenerative disc disease), lumbar Lumbar back pain with right sided radicular symptoms. She reports intermittent urine incontinence. Otherwise no other red flag signs/symptoms.  -refill of Norco provided -continue Gabapentin -encouraged activity once shortness of breath improved -if patient has worsening of symptoms would consider repeat MRI lumbar spine.  -return precautions discussed  HYPERTENSION, BENIGN SYSTEMIC Controlled. No longer taking Enalapril 10 mg (took off medication list).  -continue Enalapril-HCTZ 10-12.5, Metoprolol 100 mg BID, and Procardia XL 90 mg daily  INCONTINENCE, URGE Minimal improvement with Kegel exercises. Likely exacerbated by lasix.  -trial of Myrbetriq.

## 2016-10-16 NOTE — Assessment & Plan Note (Addendum)
Well controlled. A1C 6.7 -continue Metformin 500 mg daily -encouraged yearly eye exam

## 2016-10-16 NOTE — Assessment & Plan Note (Signed)
Minimal improvement with Kegel exercises. Likely exacerbated by lasix.  -trial of Myrbetriq.

## 2016-10-16 NOTE — Assessment & Plan Note (Signed)
Stable symptoms on Norco and Gabapentin.  -3 month supply of Norco provided.

## 2016-10-18 ENCOUNTER — Other Ambulatory Visit: Payer: Self-pay | Admitting: Family Medicine

## 2016-10-24 ENCOUNTER — Other Ambulatory Visit: Payer: Self-pay | Admitting: Family Medicine

## 2016-11-01 ENCOUNTER — Other Ambulatory Visit: Payer: Self-pay | Admitting: Family Medicine

## 2016-11-03 NOTE — Progress Notes (Deleted)
   Subjective:    Patient ID: Abigail Duncan, female    DOB: 1941-02-05, 76 y.o.   MRN: [id number]  HPI 76 y/o female presents for follow up of shortness of breath.  Shortness of breath (History of Afib and Pulm HTN) Lasix increased to 20 mg BID at last visit  Urge Incontinence Started on Myrbetriq at last visit  HM Due for mammogram and diabetic eye exam.  Review of Systems     Objective:   Physical Exam There were no vitals taken for this visit.   Echo 04/2015 The patient was in atrial fibrillation. Normal LV size with EF   55-60%. Mildly dilated RV with normal systolic function. Mild to   moderate MR. Mild to moderate pulmonary hypertension.    Assessment & Plan:  No problem-specific Assessment & Plan notes found for this encounter.

## 2016-11-06 ENCOUNTER — Ambulatory Visit: Payer: Medicare Other | Admitting: Family Medicine

## 2016-11-10 NOTE — Progress Notes (Signed)
   Subjective:    Patient ID: Abigail Duncan, female    DOB: April 03, 1941, 76 y.o.   MRN: [id number]  HPI 76 y/o female presents for follow up of shortness of breath.  Dyspnea (Associated Afib and Pulm HTN)  Lasix increased to 20 mg BID at visit on 10/16/16, no improvement, out of breath with any ambulation, no fevers, no chills, no chest pain, no cough, sleep on two pillows (sometimes 3 pillows), occasional shortness of breath at night, no change in swelling, no palpitations  Urge Incontinence Started on Myrbetriq at last visit, previously not improved with Kegel exercises, improved with medication, less urges to go, less leaking that previously  HM Due for mammogram and eye exam (recently rescheduled).   Social Lives with son, never a smoker  Review of Systems     Objective:   Physical Exam BP (!) 90/50   Pulse 90   Temp 97.7 F (36.5 C) (Oral)   Ht 5\' 4"  (1.626 m)   Wt 288 lb (130.6 kg)   SpO2 98%   BMI 49.44 kg/m  Weight down 2 pounds from last visit  Gen: pleasant female, sitting in wheelchair, NAD Cardiac: Irregular Irregular rate and rhythm, no murmur Resp: faint crackles as bases, upper airways clear, no increased work of breathing Ext: 2+ edema of bilateral LE  The patient was in atrial fibrillation. Normal LV size with EF   55-60%. Mildly dilated RV with normal systolic function. Mild to   moderate MR. Mild to moderate pulmonary hypertension    Assessment & Plan:  Shortness of breath Continued shortness of breath despite increase of lasix to 20 mg BID. Suspect Afib and Pulm Hypertension contributing.  -check Echo to re-evaluate heart function -encouraged patient to make appointment with cardiologist -cbc and cmp ordered  INCONTINENCE, URGE Improved with Myrbetriq.  -no change in therapy today  Preventative health care Encouraged yearly eye exam.

## 2016-11-12 ENCOUNTER — Encounter: Payer: Self-pay | Admitting: Family Medicine

## 2016-11-12 ENCOUNTER — Ambulatory Visit (INDEPENDENT_AMBULATORY_CARE_PROVIDER_SITE_OTHER): Payer: Medicare Other | Admitting: Family Medicine

## 2016-11-12 DIAGNOSIS — N3941 Urge incontinence: Secondary | ICD-10-CM

## 2016-11-12 DIAGNOSIS — Z0000 Encounter for general adult medical examination without abnormal findings: Secondary | ICD-10-CM

## 2016-11-12 DIAGNOSIS — I4819 Other persistent atrial fibrillation: Secondary | ICD-10-CM

## 2016-11-12 DIAGNOSIS — Z7901 Long term (current) use of anticoagulants: Secondary | ICD-10-CM

## 2016-11-12 DIAGNOSIS — R0602 Shortness of breath: Secondary | ICD-10-CM

## 2016-11-12 DIAGNOSIS — I481 Persistent atrial fibrillation: Secondary | ICD-10-CM | POA: Diagnosis not present

## 2016-11-12 LAB — POCT INR: INR: 2.8

## 2016-11-12 NOTE — Patient Instructions (Signed)
It was nice to see you today.  Shortness of breath - check labs and ultrasound of your heart, continue taking lasix twice daily  Low blood pressure - stop the Procardia, come back in 1-2 weeks for a nurse visit to recheck you blood pressure, check at the drug store and let Dr. Ree Kida know if it is low  Urine leaking - continue the Myrbetriq

## 2016-11-13 NOTE — Assessment & Plan Note (Signed)
Encouraged yearly eye exam.

## 2016-11-13 NOTE — Assessment & Plan Note (Signed)
Continued shortness of breath despite increase of lasix to 20 mg BID. Suspect Afib and Pulm Hypertension contributing.  -check Echo to re-evaluate heart function -encouraged patient to make appointment with cardiologist -cbc and cmp ordered

## 2016-11-13 NOTE — Assessment & Plan Note (Signed)
Improved with Myrbetriq.  -no change in therapy today

## 2016-11-16 ENCOUNTER — Other Ambulatory Visit: Payer: Self-pay | Admitting: Family Medicine

## 2016-11-16 DIAGNOSIS — I481 Persistent atrial fibrillation: Secondary | ICD-10-CM | POA: Diagnosis not present

## 2016-11-16 LAB — HEPATIC FUNCTION PANEL
ALT: 10 U/L (ref 7–35)
AST: 12 U/L — AB (ref 13–35)

## 2016-11-16 LAB — BASIC METABOLIC PANEL
BUN: 31 mg/dL — AB (ref 4–21)
Creatinine: 1.4 mg/dL — AB (ref <=1.1)
Glucose: 175 mg/dL
POTASSIUM: 4 mmol/L (ref 3.4–5.3)
SODIUM: 139 mmol/L (ref 137–147)

## 2016-11-16 LAB — CBC AND DIFFERENTIAL
HCT: 37 % (ref 36–46)
HEMOGLOBIN: 12.5 g/dL (ref 12.0–16.0)
Platelets: 307 10*3/uL (ref 150–399)
WBC: 9.7 10*3/mL

## 2016-11-18 ENCOUNTER — Encounter: Payer: Self-pay | Admitting: Family Medicine

## 2016-11-18 NOTE — Progress Notes (Signed)
Entered results from [place] 11/16/16.  Will have results scanned into the Media section.

## 2016-11-19 ENCOUNTER — Ambulatory Visit (HOSPITAL_COMMUNITY)
Admission: RE | Admit: 2016-11-19 | Discharge: 2016-11-19 | Disposition: A | Discharge status: Home / Self Care | Payer: Medicare Other | Source: Ambulatory Visit | Attending: Family Medicine | Admitting: Family Medicine

## 2016-11-19 ENCOUNTER — Ambulatory Visit (INDEPENDENT_AMBULATORY_CARE_PROVIDER_SITE_OTHER): Payer: Medicare Other | Admitting: *Deleted

## 2016-11-19 VITALS — BP 130/74 | HR 74

## 2016-11-19 DIAGNOSIS — R0600 Dyspnea, unspecified: Secondary | ICD-10-CM | POA: Insufficient documentation

## 2016-11-19 DIAGNOSIS — I313 Pericardial effusion (noninflammatory): Secondary | ICD-10-CM | POA: Diagnosis not present

## 2016-11-19 DIAGNOSIS — I10 Essential (primary) hypertension: Secondary | ICD-10-CM | POA: Diagnosis not present

## 2016-11-19 DIAGNOSIS — I481 Persistent atrial fibrillation: Secondary | ICD-10-CM | POA: Diagnosis not present

## 2016-11-19 DIAGNOSIS — Z0130 Encounter for examination of blood pressure without abnormal findings: Secondary | ICD-10-CM

## 2016-11-19 DIAGNOSIS — Z6841 Body Mass Index (BMI) 40.0 and over, adult: Secondary | ICD-10-CM | POA: Diagnosis not present

## 2016-11-19 DIAGNOSIS — E119 Type 2 diabetes mellitus without complications: Secondary | ICD-10-CM | POA: Insufficient documentation

## 2016-11-19 DIAGNOSIS — I4819 Other persistent atrial fibrillation: Secondary | ICD-10-CM

## 2016-11-19 LAB — ECHOCARDIOGRAM COMPLETE

## 2016-11-19 NOTE — Progress Notes (Signed)
  Echocardiogram 2D Echocardiogram has been performed.  Tresa Res 11/19/2016, 3:57 PM

## 2016-11-19 NOTE — Progress Notes (Signed)
   Patient in nurse clinic for blood pressure check.  Patient denies any symptoms today.  Will forward to PCP.  Derl Barrow, RN   Today's Vitals   11/19/16 1412  BP: 130/74  Pulse: 74  SpO2: 97%  PainSc: 0-No pain

## 2016-11-20 ENCOUNTER — Ambulatory Visit: Payer: Medicare Other

## 2016-11-23 ENCOUNTER — Telehealth: Payer: Self-pay | Admitting: Family Medicine

## 2016-11-23 NOTE — Telephone Encounter (Signed)
Discussed Echo results. EF still in normal range. Unable to access diastolic due to afib. Patient still intermittent sob. Increase Lasix to 40 mg BID for next 3 days then follow up in office later this week. Also counseled her to make a follow up appointment with cardiology.

## 2016-11-27 ENCOUNTER — Encounter: Payer: Self-pay | Admitting: Family Medicine

## 2016-11-27 ENCOUNTER — Ambulatory Visit (INDEPENDENT_AMBULATORY_CARE_PROVIDER_SITE_OTHER): Payer: Medicare Other | Admitting: Family Medicine

## 2016-11-27 DIAGNOSIS — R0602 Shortness of breath: Secondary | ICD-10-CM | POA: Diagnosis not present

## 2016-11-27 DIAGNOSIS — I10 Essential (primary) hypertension: Secondary | ICD-10-CM

## 2016-11-27 NOTE — Progress Notes (Signed)
   Subjective:    Patient ID: Abigail Duncan, female    DOB: 08/01/1941, 76 y.o.   MRN: [id number]  HPI 76 y/o female presents for follow up of shortness of breath.   Shortness of Breath Took 40 mg Lasix for 3 days, breathing is improved (25-45% better), leg swelling improved, no chest pain  Hypertension Stopped procardia after last visit due to hypotension, no further lightheadedness   Review of Systems  Constitutional: Negative for chills, fatigue and fever.  Respiratory: Positive for shortness of breath. Negative for cough.   Cardiovascular: Positive for leg swelling. Negative for chest pain.       Objective:   Physical Exam BP 118/70   Pulse 69   Temp 97.9 F (36.6 C) (Oral)   Ht 5\' 4"  (1.626 m)   Wt 281 lb (127.5 kg)   BMI 48.23 kg/m  Weight down 9 pounds from 10/16/16  Gen: pleasant female, NAD Cardiac: Irregular Irregular rhythm, S1 and S2 present, no murmur Resp: faint crackles in bases Ext: 1+ edema to mid shins  Echo 11/19/16 EF 60-65%, in afib, unable to access diastolic function, elevated pulmonary pressures     Assessment & Plan:  HYPERTENSION, BENIGN SYSTEMIC Controlled on Enalapril-HCTZ and Metoprolol. Stopped Procardia last visit.   Shortness of breath Improved with 3 days of Lasix 40 mg BID. Still not at baseline. Echo unchanged.  -continue Lasix 40 mg BID for 3 more days then transition to 40 mg daily.   Patient encourage to follow up with Cardiology for Afib and Pulm HTN.

## 2016-11-27 NOTE — Assessment & Plan Note (Signed)
Controlled on Enalapril-HCTZ and Metoprolol. Stopped Procardia last visit.

## 2016-11-27 NOTE — Patient Instructions (Addendum)
It was nice to see you today.  Continue to take Lasix 40 mg twice daily ( 2 tablets in the morning and 2 tablets with lunch) for the next 3 days. Then return to 2 tablets in the morning. Return in 2 weeks for follow up.   Please start home exercises and stretches.   1. Walk in place during commercials (form over the course of 30-60 mintues). 2. Stand from seated position, sit, repeat X10 (complete 3 sets) 3. Use soup cans/small weights - lift overhead X10 (complete 3 sets)  Return in 2 weeks.

## 2016-11-27 NOTE — Assessment & Plan Note (Signed)
Improved with 3 days of Lasix 40 mg BID. Still not at baseline. Echo unchanged.  -continue Lasix 40 mg BID for 3 more days then transition to 40 mg daily.

## 2016-11-30 ENCOUNTER — Encounter: Payer: Self-pay | Admitting: Family Medicine

## 2016-12-03 LAB — CBC WITH DIFFERENTIAL/PLATELET
Basophils Absolute: 0 10*3/uL (ref 0.0–0.2)
Basos: 0 %
EOS (ABSOLUTE): 0.3 10*3/uL (ref 0.0–0.4)
EOS: 3 %
HEMATOCRIT: 37.3 % (ref 34.0–46.6)
Hemoglobin: 12.5 g/dL (ref 11.1–15.9)
IMMATURE GRANULOCYTES: 0 %
Immature Grans (Abs): 0 10*3/uL (ref 0.0–0.1)
Lymphocytes Absolute: 2.4 10*3/uL (ref 0.7–3.1)
Lymphs: 25 %
MCH: 29.1 pg (ref 26.6–33.0)
MCHC: 33.5 g/dL (ref 31.5–35.7)
MCV: 87 fL (ref 79–97)
MONOS ABS: 0.8 10*3/uL (ref 0.1–0.9)
Monocytes: 8 %
NEUTROS PCT: 64 %
Neutrophils Absolute: 6.2 10*3/uL (ref 1.4–7.0)
PLATELETS: 307 10*3/uL (ref 150–379)
RBC: 4.3 x10E6/uL (ref 3.77–5.28)
RDW: 16.4 % — AB (ref 12.3–15.4)
WBC: 9.7 10*3/uL (ref 3.4–10.8)

## 2016-12-03 LAB — COMPREHENSIVE METABOLIC PANEL
ALT: 10 IU/L (ref 0–32)
AST: 12 IU/L (ref 0–40)
Albumin/Globulin Ratio: 1.2 (ref 1.2–2.2)
Albumin: 3.8 g/dL (ref 3.5–4.8)
Alkaline Phosphatase: 64 IU/L (ref 39–117)
BUN/Creatinine Ratio: 22 (ref 12–28)
BUN: 31 mg/dL — AB (ref 8–27)
Bilirubin Total: 0.4 mg/dL (ref 0.0–1.2)
CALCIUM: 9.2 mg/dL (ref 8.7–10.3)
CO2: 24 mmol/L (ref 18–29)
CREATININE: 1.42 mg/dL — AB (ref 0.57–1.00)
Chloride: 95 mmol/L — ABNORMAL LOW (ref 96–106)
GFR calc Af Amer: 42 mL/min/{1.73_m2} — ABNORMAL LOW (ref >59)
GFR calc non Af Amer: 37 mL/min/{1.73_m2} — ABNORMAL LOW (ref >59)
Globulin, Total: 3.1 g/dL (ref 1.5–4.5)
Glucose: 175 mg/dL — ABNORMAL HIGH (ref 65–99)
Potassium: 4 mmol/L (ref 3.5–5.2)
Sodium: 139 mmol/L (ref 134–144)
Total Protein: 6.9 g/dL (ref 6.0–8.5)

## 2016-12-09 NOTE — Progress Notes (Signed)
   Subjective:    Patient ID: Abigail Duncan, female    DOB: 1941/08/12, 76 y.o.   MRN: [id number]  HPI 76 y/o female presents for follow up of shortness of breath.  Shortness of breath Seen on 2/23, told to take Lasix 40 mg BID for 3 days then return to 40 mg daily; lots of urination, breathing improved (now 50-60% improved). Sleeps with 2 pillows at night, no shortness of breathing during the night, sob with ambulation, feels like able to ambulate further than a few weeks ago.   Has not called heart doctor yet.   Obesity/Debility Counseled on home exercises at last visit. Doing some exercises before bed each night. Repeated standing, hand weights.   Social Lives with son, never a smoker   Review of Systems No fevers, no chills, no chest pain.     Objective:   Physical Exam BP 130/76   Pulse 72   Temp 98 F (36.7 C) (Oral)   Ht 5\' 4"  (1.626 m)   Wt 282 lb 3.2 oz (128 kg)   SpO2 94%   BMI 48.44 kg/m  Repeat SP02 97%  Gen: pleasant female, NAD Cardiac: Irregular Irregular rhythm, S1 and S2 present, no murmur Resp: CTAB, normal effort Ext: trace edema  Getting labs next week for the Nephrologist     Assessment & Plan:  Shortness of breath Continues to improve.  -continue Lasix 40 mg daily -patient to follow up with Cardiolgy  Preventative health care Encouraged eye exam and mammogram.   Long term current use of anticoagulant therapy INR check today.   Also encouraged continued daily exercise/fitness routine.

## 2016-12-11 ENCOUNTER — Encounter: Payer: Self-pay | Admitting: Family Medicine

## 2016-12-11 ENCOUNTER — Ambulatory Visit (INDEPENDENT_AMBULATORY_CARE_PROVIDER_SITE_OTHER): Payer: Medicare Other | Admitting: Family Medicine

## 2016-12-11 ENCOUNTER — Ambulatory Visit (INDEPENDENT_AMBULATORY_CARE_PROVIDER_SITE_OTHER): Payer: Medicare Other | Admitting: *Deleted

## 2016-12-11 DIAGNOSIS — Z7901 Long term (current) use of anticoagulants: Secondary | ICD-10-CM

## 2016-12-11 DIAGNOSIS — R0602 Shortness of breath: Secondary | ICD-10-CM

## 2016-12-11 DIAGNOSIS — Z0000 Encounter for general adult medical examination without abnormal findings: Secondary | ICD-10-CM | POA: Diagnosis not present

## 2016-12-11 LAB — POCT INR: INR: 3.5

## 2016-12-11 NOTE — Assessment & Plan Note (Signed)
Continues to improve.  -continue Lasix 40 mg daily -patient to follow up with Cardiolgy

## 2016-12-11 NOTE — Assessment & Plan Note (Signed)
INR check today.

## 2016-12-11 NOTE — Patient Instructions (Addendum)
It was nice to see.   Please call your heart doctor to schedule an appointment. Continue to take Lasix 40 mg daily (2 of the 20 mg tablets daily).    Please continue your exercise routine.   Return in one month for refills on pain medication and check of diabetes.

## 2016-12-11 NOTE — Assessment & Plan Note (Signed)
Encouraged eye exam and mammogram.

## 2016-12-16 DIAGNOSIS — N183 Chronic kidney disease, stage 3 (moderate): Secondary | ICD-10-CM | POA: Diagnosis not present

## 2016-12-16 DIAGNOSIS — D631 Anemia in chronic kidney disease: Secondary | ICD-10-CM | POA: Diagnosis not present

## 2016-12-16 DIAGNOSIS — N2581 Secondary hyperparathyroidism of renal origin: Secondary | ICD-10-CM | POA: Diagnosis not present

## 2016-12-24 ENCOUNTER — Other Ambulatory Visit: Payer: Self-pay | Admitting: Family Medicine

## 2016-12-30 ENCOUNTER — Encounter: Payer: Self-pay | Admitting: Interventional Cardiology

## 2016-12-30 DIAGNOSIS — D631 Anemia in chronic kidney disease: Secondary | ICD-10-CM | POA: Diagnosis not present

## 2016-12-30 DIAGNOSIS — N183 Chronic kidney disease, stage 3 (moderate): Secondary | ICD-10-CM | POA: Diagnosis not present

## 2016-12-30 DIAGNOSIS — N2581 Secondary hyperparathyroidism of renal origin: Secondary | ICD-10-CM | POA: Diagnosis not present

## 2016-12-30 DIAGNOSIS — I10 Essential (primary) hypertension: Secondary | ICD-10-CM | POA: Diagnosis not present

## 2016-12-31 ENCOUNTER — Ambulatory Visit: Payer: Medicare Other

## 2016-12-31 ENCOUNTER — Other Ambulatory Visit: Payer: Self-pay | Admitting: Family Medicine

## 2017-01-04 ENCOUNTER — Ambulatory Visit: Payer: Medicare Other

## 2017-01-06 ENCOUNTER — Other Ambulatory Visit (INDEPENDENT_AMBULATORY_CARE_PROVIDER_SITE_OTHER): Payer: Medicare Other | Admitting: *Deleted

## 2017-01-06 DIAGNOSIS — Z7901 Long term (current) use of anticoagulants: Secondary | ICD-10-CM | POA: Diagnosis not present

## 2017-01-06 LAB — POCT INR: INR: 3.4

## 2017-01-12 ENCOUNTER — Ambulatory Visit: Payer: Medicare Other | Admitting: Interventional Cardiology

## 2017-01-12 NOTE — Progress Notes (Deleted)
Patient ID: Abigail Duncan, female   DOB: September 06, 1941, 76 y.o.   MRN: [id number]     Cardiology Office Note   Date:  01/12/2017   ID:  Abigail Duncan, DOB [birth year], MRN [id number]  PCP:  [name], MD    No chief complaint on file.   Atrial fibrillation, shortness of breath   Wt Readings from Last 3 Encounters:  12/11/16 282 lb 3.2 oz (128 kg)  11/27/16 281 lb (127.5 kg)  11/12/16 288 lb (130.6 kg)       History of Present Illness: Abigail Duncan is a 76 y.o. female   Who has had atrial fibrillation for several years. She recently underwent an echocardiogram. This showed normal left ventricular function with a dilated right ventricle and right atrium. There was evidence of pulmonary hypertension. She denies any chronic lung problems. She does state that she does not sleep well. She snores heavily. She does feel tired in the mornings  after sleeping and can fall sleep easily when she does not mean to.   She walks with the assistance of a walker. She denies any chest discomfort. She does have shortness of breath with walking. She has occasional lower extremity edema. No orthopnea. She has never been a smoker. Her orthopedic issues limit her most.    Past Medical History:  Diagnosis Date  . Degenerative arthritis of knee 04/2001   by xray  . Sciatica of left side 12/2003   DDD L5,S1   . Spinal stenosis, lumbar 04/2004   mild on MRI at L 4-5    Past Surgical History:  Procedure Laterality Date  . benign breast tumor     Excised 40+ years ago  . COLPOSCOPY  03/1991   dysplasia  . PTCA (aka ANGIOPLASTY)  07/1993   RCA     Current Outpatient Prescriptions  Medication Sig Dispense Refill  . calcium-vitamin D (OSCAL WITH D) 500-200 MG-UNIT per tablet Take 2 tablets by mouth daily with breakfast. 180 tablet 1  . enalapril-hydrochlorothiazide (VASERETIC) 10-25 MG tablet TAKE 1 TABLET BY MOUTH DAILY 90 tablet 1  . fluticasone (FLONASE) 50 MCG/ACT nasal spray Place 2 sprays  into the nose daily. In each nostril daily 16 g 11  . furosemide (LASIX) 20 MG tablet Take 2 tablets (40 mg total) by mouth daily. 180 tablet 1  . gabapentin (NEURONTIN) 300 MG capsule TAKE 2 CAPSULES BY MOUTH 3 TIMES A DAY 180 capsule 5  . HYDROcodone-acetaminophen (NORCO/VICODIN) 5-325 MG tablet Take 1 tablet by mouth every 6 (six) hours as needed for moderate pain. 90 tablet 0  . metFORMIN (GLUCOPHAGE) 500 MG tablet TAKE 1 TABLET BY MOUTH EVERY DAY WITH BREAKFAST 90 tablet 1  . metoprolol (LOPRESSOR) 100 MG tablet TAKE 1 TABLET BY MOUTH TWICE A DAY 180 tablet 1  . mirabegron ER (MYRBETRIQ) 25 MG TB24 tablet Take 1 tablet (25 mg total) by mouth daily. 30 tablet 2  . NITROSTAT 0.4 MG SL tablet PLACE 1 TABLET (0.4 MG TOTAL) UNDER THE TONGUE EVERY 5 (FIVE) MINUTES AS NEEDED FOR CHEST PAIN. 25 tablet 1  . rosuvastatin (CRESTOR) 20 MG tablet TAKE 1/2 TABLET BY MOUTH ONCE DAILY 45 tablet 1  . warfarin (COUMADIN) 5 MG tablet TAKE AS DIRECTED 30 tablet 4   No current facility-administered medications for this visit.     Allergies:   Lisinopril    Social History:  The patient  reports that she has never smoked. She has never used smokeless tobacco.  She reports that she does not drink alcohol or use drugs.   Family History:  The patient's family history includes Cancer in her mother and sister; Cancer (age of onset: 37) in her father; Diabetes in her brother.    ROS:  Please see the history of present illness.   Otherwise, review of systems are positive for  Joint pains , shortness of breath as noted above.   All other systems are reviewed and negative.    PHYSICAL EXAM: VS:  There were no vitals taken for this visit. , BMI There is no height or weight on file to calculate BMI. GEN: Well nourished, well developed, in no acute distress HEENT: normal Neck: no JVD, carotid bruits, or masses Cardiac:  Irregularly irregular; no murmurs, rubs, or gallops,no edema  Respiratory:  clear to auscultation  bilaterally, normal work of breathing GI: soft, nontender, nondistended, + BS MS: no deformity or atrophy,  Slow gait Skin: warm and dry, no rash Neuro:  Strength and sensation are intact Psych: euthymic mood, full affect   EKG:   The ekg ordered today demonstrates  Atrial fibrillation with controlled ventricular response , poor R wave progression   Recent Labs: 03/20/2016: TSH 3.58 11/16/2016: ALT 10; BUN 31; Creatinine, Ser 1.42; Hemoglobin 12.5; Platelets 307; Potassium 4.0; Sodium 139   Lipid Panel    Component Value Date/Time   CHOL 217 (H) 03/20/2016 1214   TRIG 445 (H) 03/20/2016 1214   HDL 42 (L) 03/20/2016 1214   CHOLHDL 5.2 (H) 03/20/2016 1214   VLDL NOT CALC 03/20/2016 1214   LDLCALC NOT CALC 03/20/2016 1214   LDLDIRECT 59 09/03/2009 2024     Other studies Reviewed: Additional studies/ records that were reviewed today with results demonstrating:  Echocardiogram as described above.   ASSESSMENT AND PLAN:  1.  atrial fibrillation: Currently rate controlled. Coumadin for stroke prevention. Continue current medical therapy. This could be related to undiagnosed sleep apnea given her other symptoms.  2.  shortness of breath: Check BNP and electrolytes. Look for fluid overload. If she doesn't fact have fluid overload on her hydrochlorothiazide, would have to consider changing her diuretics from HCTZ to furosemide. 3.  likely obstructive sleep apnea: She has several symptoms. Will send for  Sleep study. This could explain her right heart changes on her recent abnormal echocardiogram. It would also contribute to her fatigue, shortness of breath and atrial fibrillation. 4.  Would not plan ischemic workup at this point. She does have the diagnosis of coronary artery disease in the chart but she does not think she has ever had any revascularization performed.  She did have a heart catheterization over 20 years ago.  If she does develop any chest discomfort, she should let us  know.   Current medicines are reviewed at length with the patient today.  The patient concerns regarding her medicines were addressed.  The following changes have been made:  No change  Labs/ tests ordered today include:   No orders of the defined types were placed in this encounter.   Recommend 150 minutes/week of aerobic exercise Low fat, low carb, high fiber diet recommended  Disposition:   FU  After sleep study. Will call her with lab results.   Signed, Larae Grooms, MD  01/12/2017 2:07 PM    [facility] [place], [place], [place]  [address] Phone: [phone]; Fax: [phone]

## 2017-01-13 ENCOUNTER — Other Ambulatory Visit: Payer: Self-pay | Admitting: Family Medicine

## 2017-01-13 DIAGNOSIS — N3941 Urge incontinence: Secondary | ICD-10-CM

## 2017-01-18 NOTE — Progress Notes (Signed)
   Subjective:    Patient ID: Abigail Duncan, female    DOB: 04-14-1941, 76 y.o.   MRN: [id number]  HPI 76 y/o female presents for routine follow up of multiple medical problems.  Shortness of Breath Told to continue Lasix 40 mg at last visit and follow up with Cardiology. Known history of Afib/Pulm HTN. Continues to have exertional dyspnea, no shortness of breath at rest, will see Cardiology next week  Type 2 DM Currently prescribed Metformin 500 mg daily. No hypoglycemia.    Diet - not eating well past few days due to last of power Exercise - has not been doing lately due to power outage, previously started home exercise routine after last 2 visits.   Neuropathy Symptoms stable on Gabpentin  Chronic Anticoagulation Has not had greens in the past few days.   Chronic Pain/Lumbar DDD Prescribed Norco 5/325 mg TID PRN, pain usually 4/10 when taking meds, gets up to 6/10 when misses dosese, still has radiation down right leg, no urine/bowel incontinence  HM Due for eye exam (last documented 01/2014); Also due for mammogram, due for foot exam   Review of Systems  Constitutional: Negative for chills, fatigue and fever.  Respiratory: Negative for cough.   Gastrointestinal: Negative for diarrhea, nausea and vomiting.       Objective:   Physical Exam BP 118/66   Pulse 77   Temp 98.3 F (36.8 C) (Oral)   Ht 5\' 4"  (1.626 m)   Wt 286 lb 12.8 oz (130.1 kg)   SpO2 96%   BMI 49.23 kg/m   Gen: pleasant female, NAD Cardiac: Irregular Irregular, S1 and S2 present, no murmur Resp: CTAB, normal effort Ext: 1+ edema bilaterally Foot Exam: 2+ DP pulses, no skin breakdown/ulceration, sensation intact to monofilament testing  A1C 6.7 in January 2018 POC A1C 7.7     Assessment & Plan:  Type 2 diabetes mellitus, controlled A1C elevated today to 7.7 likely due to diet indiscretion and not exercising regularly. -encouraged dietary changes, regular exercise, and weight loss -continue  Metformin -recheck A1C in 3 months -foot exam completed today -encouraged follow up with opthalmology for yearly eye exam  Long term current use of anticoagulant therapy INR elevated today. Likely due to no greens over the past few days.  -hold Coumadin for today, resume home dose tomorrow  Shortness of breath Stable, Continue lasix 40 mg daily.   Diabetic peripheral neuropathy Stable.  -continue Gabapentin 600 mg TID  Encounter for chronic pain management Function improved with current regimen. [place] Narcotic Database reviewed on 4/18 and no red flags. -3 month refill of Norco provided

## 2017-01-21 ENCOUNTER — Ambulatory Visit (INDEPENDENT_AMBULATORY_CARE_PROVIDER_SITE_OTHER): Payer: Medicare Other | Admitting: Family Medicine

## 2017-01-21 ENCOUNTER — Encounter: Payer: Self-pay | Admitting: Family Medicine

## 2017-01-21 VITALS — BP 118/66 | HR 77 | Temp 98.3°F | Ht 64.0 in | Wt 286.8 lb

## 2017-01-21 DIAGNOSIS — G8929 Other chronic pain: Secondary | ICD-10-CM | POA: Diagnosis not present

## 2017-01-21 DIAGNOSIS — R0602 Shortness of breath: Secondary | ICD-10-CM

## 2017-01-21 DIAGNOSIS — I272 Pulmonary hypertension, unspecified: Secondary | ICD-10-CM

## 2017-01-21 DIAGNOSIS — E119 Type 2 diabetes mellitus without complications: Secondary | ICD-10-CM

## 2017-01-21 DIAGNOSIS — E1142 Type 2 diabetes mellitus with diabetic polyneuropathy: Secondary | ICD-10-CM | POA: Diagnosis not present

## 2017-01-21 DIAGNOSIS — Z7901 Long term (current) use of anticoagulants: Secondary | ICD-10-CM | POA: Diagnosis not present

## 2017-01-21 DIAGNOSIS — M5136 Other intervertebral disc degeneration, lumbar region: Secondary | ICD-10-CM

## 2017-01-21 LAB — POCT INR: INR: 3.9

## 2017-01-21 MED ORDER — HYDROCODONE-ACETAMINOPHEN 5-325 MG PO TABS: 1.0000 | ORAL_TABLET | Freq: Four times a day (QID) | ORAL | 0 refills | Status: DC | PRN

## 2017-01-21 NOTE — Patient Instructions (Signed)
It was nice to see you today.  Diabetes - your A1C was elevated today to 7.7, continue daily Metformin, please resume your home exercise regimen, increase your intake of fruits/vegetables  Shortness of Breath - continue Lasix daily, keep your appointment with Cardiology next week  Chronic Pain - I have provided your with refills of your Norco

## 2017-01-21 NOTE — Assessment & Plan Note (Signed)
Stable, Continue lasix 40 mg daily.

## 2017-01-21 NOTE — Assessment & Plan Note (Signed)
A1C elevated today to 7.7 likely due to diet indiscretion and not exercising regularly. -encouraged dietary changes, regular exercise, and weight loss -continue Metformin -recheck A1C in 3 months -foot exam completed today -encouraged follow up with opthalmology for yearly eye exam

## 2017-01-21 NOTE — Assessment & Plan Note (Signed)
INR elevated today. Likely due to no greens over the past few days.  -hold Coumadin for today, resume home dose tomorrow

## 2017-01-21 NOTE — Assessment & Plan Note (Signed)
Function improved with current regimen. [place] Narcotic Database reviewed on 4/18 and no red flags. -3 month refill of Norco provided

## 2017-01-21 NOTE — Assessment & Plan Note (Signed)
Stable.  -continue Gabapentin 600 mg TID

## 2017-01-22 LAB — POCT GLYCOSYLATED HEMOGLOBIN (HGB A1C): Hemoglobin A1C: 7.7

## 2017-01-25 ENCOUNTER — Encounter: Payer: Self-pay | Admitting: Family Medicine

## 2017-01-25 DIAGNOSIS — E559 Vitamin D deficiency, unspecified: Secondary | ICD-10-CM | POA: Insufficient documentation

## 2017-01-25 NOTE — Progress Notes (Addendum)
Patient ID: Abigail Duncan, female   DOB: [birth year], 76 y.o.   MRN: [id number]     Cardiology Office Note   Date:  01/26/2017   ID:  Abigail Duncan, DOB Mar 31, 1941, MRN [id number]  PCP:  [name], MD    No chief complaint on file.   Atrial fibrillation, shortness of breath   Wt Readings from Last 3 Encounters:  01/21/17 286 lb 12.8 oz (130.1 kg)  12/11/16 282 lb 3.2 oz (128 kg)  11/27/16 281 lb (127.5 kg)       History of Present Illness: Abigail Duncan is a 76 y.o. female   Who has had atrial fibrillation for several years. She recently underwent an echocardiogram. This showed normal left ventricular function with a dilated right ventricle and right atrium. There was evidence of pulmonary hypertension. She denies any chronic lung problems. She does state that she does not sleep well. She snores heavily. She does feel tired in the mornings  after sleeping and can fall sleep easily when she does not mean to.   She walks with the assistance of a cane.  She continues to have hip and back pain which is limiting.  She does not do much walking.  She eats corn and potatoes.  She drinks some sugary drinks at times.  She has gained weight since the last visit.   She denies any chest discomfort. She does have shortness of breath with walking. She has occasional lower extremity edema. No orthopnea. She has never been a smoker. Her orthopedic issues limit her most.  She has some hip pain as well.   Her biggest complaint is SHOB.  Oxygen sats 95% today.    Past Medical History:  Diagnosis Date  . Degenerative arthritis of knee 04/2001   by xray  . Sciatica of left side 12/2003   DDD L5,S1   . Spinal stenosis, lumbar 04/2004   mild on MRI at L 4-5    Past Surgical History:  Procedure Laterality Date  . benign breast tumor     Excised 40+ years ago  . COLPOSCOPY  03/1991   dysplasia  . PTCA (aka ANGIOPLASTY)  07/1993   RCA     Current Outpatient Prescriptions  Medication Sig  Dispense Refill  . calcium-vitamin D (OSCAL WITH D) 500-200 MG-UNIT per tablet Take 2 tablets by mouth daily with breakfast. 180 tablet 1  . enalapril-hydrochlorothiazide (VASERETIC) 10-25 MG tablet TAKE 1 TABLET BY MOUTH DAILY 90 tablet 1  . fluticasone (FLONASE) 50 MCG/ACT nasal spray Place 2 sprays into the nose daily. In each nostril daily 16 g 11  . furosemide (LASIX) 20 MG tablet Take 2 tablets (40 mg total) by mouth daily. 180 tablet 1  . gabapentin (NEURONTIN) 300 MG capsule TAKE 2 CAPSULES BY MOUTH 3 TIMES A DAY 180 capsule 5  . [START ON 02/20/2017] HYDROcodone-acetaminophen (NORCO/VICODIN) 5-325 MG tablet Take 1 tablet by mouth every 6 (six) hours as needed for moderate pain. 90 tablet 0  . metFORMIN (GLUCOPHAGE) 500 MG tablet TAKE 1 TABLET BY MOUTH EVERY DAY WITH BREAKFAST 90 tablet 1  . metoprolol (LOPRESSOR) 100 MG tablet TAKE 1 TABLET BY MOUTH TWICE A DAY 180 tablet 1  . MYRBETRIQ 25 MG TB24 tablet TAKE 1 TABLET BY MOUTH EVERY DAY 30 tablet 2  . NITROSTAT 0.4 MG SL tablet PLACE 1 TABLET (0.4 MG TOTAL) UNDER THE TONGUE EVERY 5 (FIVE) MINUTES AS NEEDED FOR CHEST PAIN. 25 tablet 1  . rosuvastatin (  CRESTOR) 20 MG tablet TAKE 1/2 TABLET BY MOUTH ONCE DAILY 45 tablet 1  . Vitamin D, Ergocalciferol, (DRISDOL) 50000 units CAPS capsule Take 50,000 Units by mouth every 7 (seven) days.    Marland Kitchen warfarin (COUMADIN) 5 MG tablet TAKE AS DIRECTED 30 tablet 4   No current facility-administered medications for this visit.     Allergies:   Lisinopril    Social History:  The patient  reports that she has never smoked. She has never used smokeless tobacco. She reports that she does not drink alcohol or use drugs.   Family History:  The patient's family history includes Cancer in her mother and sister; Cancer (age of onset: 87) in her father; Diabetes in her brother.    ROS:  Please see the history of present illness.   Otherwise, review of systems are positive for  Joint pains , shortness of breath  as noted above.   All other systems are reviewed and negative.    PHYSICAL EXAM: VS:  BP 120/90 (BP Location: Right Arm, Patient Position: Sitting, Cuff Size: Large)   Pulse 74   Ht 5\' 4"  (1.626 m)   SpO2 95%  , BMI There is no height or weight on file to calculate BMI. GEN: Well nourished, well developed, in no acute distress  HEENT: normal  Neck: no JVD, carotid bruits, or masses Cardiac:  Irregularly irregular; no murmurs, rubs, or gallops,no edema  Respiratory:  clear to auscultation bilaterally, normal work of breathing GI: soft, nontender, nondistended, + BS MS: no deformity or atrophy ,  Slow gait Skin: warm and dry, no rash Neuro:  Strength and sensation are intact Psych: euthymic mood, full affect   EKG:   The ekg ordered today demonstrates  Atrial fibrillation with controlled ventricular response   Recent Labs: 03/20/2016: TSH 3.58 11/16/2016: ALT 10; BUN 31; Creatinine, Ser 1.42; Hemoglobin 12.5; Platelets 307; Potassium 4.0; Sodium 139   Lipid Panel    Component Value Date/Time   CHOL 217 (H) 03/20/2016 1214   TRIG 445 (H) 03/20/2016 1214   HDL 42 (L) 03/20/2016 1214   CHOLHDL 5.2 (H) 03/20/2016 1214   VLDL NOT CALC 03/20/2016 1214   LDLCALC NOT CALC 03/20/2016 1214   LDLDIRECT 59 09/03/2009 2024     Other studies Reviewed: Additional studies/ records that were reviewed today with results demonstrating:  Echocardiogram 2018: Left ventricle: The cavity size was normal. Wall thickness was   normal. Systolic function was normal. The estimated ejection   fraction was in the range of 60% to 65%. Wall motion was normal;   there were no regional wall motion abnormalities. - Pulmonary arteries: Systolic pressure was mildly increased. PA   peak pressure: 37 mm Hg (S). - Pericardium, extracardiac: A trivial pericardial effusion was   identified.   ASSESSMENT AND PLAN:  1.  atrial fibrillation: Currently rate controlled. Coumadin for stroke prevention. Continue  current medical therapy.  SHe has SHOB as well.  I think it is multifactorial from obesity, deconditioning- orthopedic issues limit activity as well.  Will check for fluid overload.  Check BNP.   2.  shortness of breath: Check BNP and electrolytes. Look for fluid overload. If she has fluid overload on her hydrochlorothiazide, and furosemide, will adjust the dose. Willl have to watch for hyponatremia on two diuretics. 3.  likely obstructive sleep apnea based on body habitus: She had  Sleep study in 2016. No CPAP was recommended per her report. 4.   She does have the diagnosis  of coronary artery disease in the chart but she does not think she has ever had any revascularization performed.  She did have a heart catheterization over 20 years ago.  If she does develop any chest discomfort, she should let us know.  No ischemic w/u at this time.  5. HTN: Continue BP lowering meds. 6. Pulmonary HTN: Mild noted on echo.   Current medicines are reviewed at length with the patient today.  The patient concerns regarding her medicines were addressed.  The following changes have been made:  No change  Labs/ tests ordered today include:   No orders of the defined types were placed in this encounter.   Recommend 150 minutes/week of aerobic exercise; she needs to increase gradually her exercise Low fat, low carb, high fiber diet recommended- info given.  Decrease sugar intake.  I explained that her sx will not improve without weight loss.  Disposition:    Will call her with lab results.   Signed, Larae Grooms, MD  01/26/2017 9:05 AM    [facility] [address], [place], [place]  [address] Phone: [phone]; Fax: [phone]

## 2017-01-26 ENCOUNTER — Other Ambulatory Visit: Payer: Self-pay

## 2017-01-26 ENCOUNTER — Encounter (INDEPENDENT_AMBULATORY_CARE_PROVIDER_SITE_OTHER): Payer: Self-pay

## 2017-01-26 ENCOUNTER — Encounter: Payer: Self-pay | Admitting: Interventional Cardiology

## 2017-01-26 ENCOUNTER — Ambulatory Visit (INDEPENDENT_AMBULATORY_CARE_PROVIDER_SITE_OTHER): Payer: Medicare Other | Admitting: Interventional Cardiology

## 2017-01-26 VITALS — BP 120/90 | HR 74 | Ht 64.0 in

## 2017-01-26 DIAGNOSIS — I10 Essential (primary) hypertension: Secondary | ICD-10-CM

## 2017-01-26 DIAGNOSIS — R0602 Shortness of breath: Secondary | ICD-10-CM | POA: Diagnosis not present

## 2017-01-26 DIAGNOSIS — I4891 Unspecified atrial fibrillation: Secondary | ICD-10-CM

## 2017-01-26 DIAGNOSIS — I272 Pulmonary hypertension, unspecified: Secondary | ICD-10-CM

## 2017-01-26 DIAGNOSIS — Z79899 Other long term (current) drug therapy: Secondary | ICD-10-CM

## 2017-01-26 LAB — BASIC METABOLIC PANEL
BUN / CREAT RATIO: 24 (ref 12–28)
BUN: 32 mg/dL — ABNORMAL HIGH (ref 8–27)
CO2: 23 mmol/L (ref 18–29)
Calcium: 9.2 mg/dL (ref 8.7–10.3)
Chloride: 96 mmol/L (ref 96–106)
Creatinine, Ser: 1.36 mg/dL — ABNORMAL HIGH (ref 0.57–1.00)
GFR, EST AFRICAN AMERICAN: 45 mL/min/{1.73_m2} — AB (ref >59)
GFR, EST NON AFRICAN AMERICAN: 39 mL/min/{1.73_m2} — AB (ref >59)
Glucose: 168 mg/dL — ABNORMAL HIGH (ref 65–99)
POTASSIUM: 4.2 mmol/L (ref 3.5–5.2)
SODIUM: 142 mmol/L (ref 134–144)

## 2017-01-26 LAB — PRO B NATRIURETIC PEPTIDE: NT-Pro BNP: 1919 pg/mL — ABNORMAL HIGH (ref 0–301)

## 2017-01-26 MED ORDER — POTASSIUM CHLORIDE CRYS ER 20 MEQ PO TBCR: 20.0000 meq | EXTENDED_RELEASE_TABLET | Freq: Every day | ORAL | 3 refills | Status: DC

## 2017-01-26 NOTE — Patient Instructions (Signed)
Medication Instructions:  Your physician recommends that you continue on your current medications as directed. Please refer to the Current Medication list given to you today.   Labwork: BMET, BNP today.  Testing/Procedures: None ordered.  Follow-Up: Your physician wants you to follow-up in: 1 year with Dr. Irish Lack. You will receive a reminder letter in the mail two months in advance. If you don't receive a letter, please call our office to schedule the follow-up appointment.   Any Other Special Instructions Will Be Listed Below (If Applicable).    DASH Eating Plan DASH stands for "Dietary Approaches to Stop Hypertension." The DASH eating plan is a healthy eating plan that has been shown to reduce high blood pressure (hypertension). It may also reduce your risk for type 2 diabetes, heart disease, and stroke. The DASH eating plan may also help with weight loss. What are tips for following this plan? General guidelines   Avoid eating more than 2,300 mg (milligrams) of salt (sodium) a day. If you have hypertension, you may need to reduce your sodium intake to 1,500 mg a day.  Limit alcohol intake to no more than 1 drink a day for nonpregnant women and 2 drinks a day for men. One drink equals 12 oz of beer, 5 oz of wine, or 1 oz of hard liquor.  Work with your health care provider to maintain a healthy body weight or to lose weight. Ask what an ideal weight is for you.  Get at least 30 minutes of exercise that causes your heart to beat faster (aerobic exercise) most days of the week. Activities may include walking, swimming, or biking.  Work with your health care provider or diet and nutrition specialist (dietitian) to adjust your eating plan to your individual calorie needs. Reading food labels   Check food labels for the amount of sodium per serving. Choose foods with less than 5 percent of the Daily Value of sodium. Generally, foods with less than 300 mg of sodium per serving fit  into this eating plan.  To find whole grains, look for the word "whole" as the first word in the ingredient list. Shopping   Buy products labeled as "low-sodium" or "no salt added."  Buy fresh foods. Avoid canned foods and premade or frozen meals. Cooking   Avoid adding salt when cooking. Use salt-free seasonings or herbs instead of table salt or sea salt. Check with your health care provider or pharmacist before using salt substitutes.  Do not fry foods. Cook foods using healthy methods such as baking, boiling, grilling, and broiling instead.  Cook with heart-healthy oils, such as olive, canola, soybean, or sunflower oil. Meal planning    Eat a balanced diet that includes:  5 or more servings of fruits and vegetables each day. At each meal, try to fill half of your plate with fruits and vegetables.  Up to 6-8 servings of whole grains each day.  Less than 6 oz of lean meat, poultry, or fish each day. A 3-oz serving of meat is about the same size as a deck of cards. One egg equals 1 oz.  2 servings of low-fat dairy each day.  A serving of nuts, seeds, or beans 5 times each week.  Heart-healthy fats. Healthy fats called Omega-3 fatty acids are found in foods such as flaxseeds and coldwater fish, like sardines, salmon, and mackerel.  Limit how much you eat of the following:  Canned or prepackaged foods.  Food that is high in trans fat, such as  fried foods.  Food that is high in saturated fat, such as fatty meat.  Sweets, desserts, sugary drinks, and other foods with added sugar.  Full-fat dairy products.  Do not salt foods before eating.  Try to eat at least 2 vegetarian meals each week.  Eat more home-cooked food and less restaurant, buffet, and fast food.  When eating at a restaurant, ask that your food be prepared with less salt or no salt, if possible. What foods are recommended? The items listed may not be a complete list. Talk with your dietitian about what  dietary choices are best for you. Grains  Whole-grain or whole-wheat bread. Whole-grain or whole-wheat pasta. Brown rice. [place]row. Bulgur. Whole-grain and low-sodium cereals. Pita bread. Low-fat, low-sodium crackers. Whole-wheat flour tortillas. Vegetables  Fresh or frozen vegetables (raw, steamed, roasted, or grilled). Low-sodium or reduced-sodium tomato and vegetable juice. Low-sodium or reduced-sodium tomato sauce and tomato paste. Low-sodium or reduced-sodium canned vegetables. Fruits  All fresh, dried, or frozen fruit. Canned fruit in natural juice (without added sugar). Meat and other protein foods  Skinless chicken or [place]. Ground chicken or [place]. Pork with fat trimmed off. Fish and seafood. Egg whites. Dried beans, peas, or lentils. Unsalted nuts, nut butters, and seeds. Unsalted canned beans. Lean cuts of beef with fat trimmed off. Low-sodium, lean deli meat. Dairy  Low-fat (1%) or fat-free (skim) milk. Fat-free, low-fat, or reduced-fat cheeses. Nonfat, low-sodium ricotta or cottage cheese. Low-fat or nonfat yogurt. Low-fat, low-sodium cheese. Fats and oils  Soft margarine without trans fats. Vegetable oil. Low-fat, reduced-fat, or light mayonnaise and salad dressings (reduced-sodium). Canola, safflower, olive, soybean, and sunflower oils. Avocado. Seasoning and other foods  Herbs. Spices. Seasoning mixes without salt. Unsalted popcorn and pretzels. Fat-free sweets. What foods are not recommended? The items listed may not be a complete list. Talk with your dietitian about what dietary choices are best for you. Grains  Baked goods made with fat, such as croissants, muffins, or some breads. Dry pasta or rice meal packs. Vegetables  Creamed or fried vegetables. Vegetables in a cheese sauce. Regular canned vegetables (not low-sodium or reduced-sodium). Regular canned tomato sauce and paste (not low-sodium or reduced-sodium). Regular tomato and vegetable juice (not low-sodium or  reduced-sodium). Angie Fava. Olives. Fruits  Canned fruit in a light or heavy syrup. Fried fruit. Fruit in cream or butter sauce. Meat and other protein foods  Fatty cuts of meat. Ribs. Fried meat. Berniece Salines. Sausage. Bologna and other processed lunch meats. Salami. Fatback. Hotdogs. Bratwurst. Salted nuts and seeds. Canned beans with added salt. Canned or smoked fish. Whole eggs or egg yolks. Chicken or [place] with skin. Dairy  Whole or 2% milk, cream, and half-and-half. Whole or full-fat cream cheese. Whole-fat or sweetened yogurt. Full-fat cheese. Nondairy creamers. Whipped toppings. Processed cheese and cheese spreads. Fats and oils  Butter. Stick margarine. Lard. Shortening. Ghee. Bacon fat. Tropical oils, such as coconut, palm kernel, or palm oil. Seasoning and other foods  Salted popcorn and pretzels. Onion salt, garlic salt, seasoned salt, table salt, and sea salt. Worcestershire sauce. Tartar sauce. Barbecue sauce. Teriyaki sauce. Soy sauce, including reduced-sodium. Steak sauce. Canned and packaged gravies. Fish sauce. Oyster sauce. Cocktail sauce. Horseradish that you find on the shelf. Ketchup. Mustard. Meat flavorings and tenderizers. Bouillon cubes. Hot sauce and Tabasco sauce. Premade or packaged marinades. Premade or packaged taco seasonings. Relishes. Regular salad dressings. Where to find more information:  National Heart, Lung, and [place]: https://wilson-eaton.com/  American Heart Association: www.heart.org Summary  The DASH eating plan is a healthy eating plan that has been shown to reduce high blood pressure (hypertension). It may also reduce your risk for type 2 diabetes, heart disease, and stroke.  With the DASH eating plan, you should limit salt (sodium) intake to 2,300 mg a day. If you have hypertension, you may need to reduce your sodium intake to 1,500 mg a day.  When on the DASH eating plan, aim to eat more fresh fruits and vegetables, whole grains, lean proteins, low-fat  dairy, and heart-healthy fats.  Work with your health care provider or diet and nutrition specialist (dietitian) to adjust your eating plan to your individual calorie needs. This information is not intended to replace advice given to you by your health care provider. Make sure you discuss any questions you have with your health care provider. Document Released: 09/10/2011 Document Revised: 09/14/2016 Document Reviewed: 09/14/2016 Elsevier Interactive Patient Education  2017 [employer].   If you need a refill on your cardiac medications before your next appointment, please call your pharmacy.

## 2017-02-03 ENCOUNTER — Ambulatory Visit (INDEPENDENT_AMBULATORY_CARE_PROVIDER_SITE_OTHER): Payer: Medicare Other | Admitting: *Deleted

## 2017-02-03 DIAGNOSIS — I4891 Unspecified atrial fibrillation: Secondary | ICD-10-CM

## 2017-02-03 DIAGNOSIS — Z7901 Long term (current) use of anticoagulants: Secondary | ICD-10-CM | POA: Diagnosis not present

## 2017-02-03 LAB — POCT INR: INR: 3.1

## 2017-02-04 ENCOUNTER — Other Ambulatory Visit: Payer: Medicare Other | Admitting: *Deleted

## 2017-02-04 DIAGNOSIS — Z79899 Other long term (current) drug therapy: Secondary | ICD-10-CM | POA: Diagnosis not present

## 2017-02-04 LAB — BASIC METABOLIC PANEL
BUN / CREAT RATIO: 20 (ref 12–28)
BUN: 33 mg/dL — AB (ref 8–27)
CO2: 23 mmol/L (ref 18–29)
CREATININE: 1.65 mg/dL — AB (ref 0.57–1.00)
Calcium: 8.9 mg/dL (ref 8.7–10.3)
Chloride: 93 mmol/L — ABNORMAL LOW (ref 96–106)
GFR calc Af Amer: 35 mL/min/{1.73_m2} — ABNORMAL LOW (ref >59)
GFR calc non Af Amer: 31 mL/min/{1.73_m2} — ABNORMAL LOW (ref >59)
Glucose: 173 mg/dL — ABNORMAL HIGH (ref 65–99)
Potassium: 4.4 mmol/L (ref 3.5–5.2)
SODIUM: 136 mmol/L (ref 134–144)

## 2017-02-04 LAB — PRO B NATRIURETIC PEPTIDE: NT-PRO BNP: 1559 pg/mL — AB (ref 0–301)

## 2017-02-11 ENCOUNTER — Other Ambulatory Visit: Payer: Self-pay

## 2017-02-11 DIAGNOSIS — R0602 Shortness of breath: Secondary | ICD-10-CM

## 2017-02-11 MED ORDER — ENALAPRIL MALEATE 10 MG PO TABS: 10.0000 mg | ORAL_TABLET | Freq: Every day | ORAL | 3 refills | Status: DC

## 2017-02-11 MED ORDER — FUROSEMIDE 40 MG PO TABS: 40.0000 mg | ORAL_TABLET | Freq: Two times a day (BID) | ORAL | 3 refills | Status: DC

## 2017-02-16 ENCOUNTER — Other Ambulatory Visit: Payer: Self-pay | Admitting: Family Medicine

## 2017-02-17 ENCOUNTER — Ambulatory Visit (INDEPENDENT_AMBULATORY_CARE_PROVIDER_SITE_OTHER): Payer: Medicare Other | Admitting: *Deleted

## 2017-02-17 DIAGNOSIS — I4891 Unspecified atrial fibrillation: Secondary | ICD-10-CM

## 2017-02-17 DIAGNOSIS — Z7901 Long term (current) use of anticoagulants: Secondary | ICD-10-CM

## 2017-02-17 LAB — POCT INR: INR: 3.5

## 2017-02-21 ENCOUNTER — Other Ambulatory Visit: Payer: Self-pay | Admitting: Family Medicine

## 2017-02-22 ENCOUNTER — Encounter: Payer: Self-pay | Admitting: Cardiology

## 2017-02-22 ENCOUNTER — Ambulatory Visit (INDEPENDENT_AMBULATORY_CARE_PROVIDER_SITE_OTHER): Payer: Medicare Other | Admitting: Cardiology

## 2017-02-22 VITALS — BP 118/74 | HR 66 | Ht 63.0 in | Wt 285.0 lb

## 2017-02-22 DIAGNOSIS — R0602 Shortness of breath: Secondary | ICD-10-CM

## 2017-02-22 DIAGNOSIS — I10 Essential (primary) hypertension: Secondary | ICD-10-CM

## 2017-02-22 DIAGNOSIS — Z7901 Long term (current) use of anticoagulants: Secondary | ICD-10-CM | POA: Diagnosis not present

## 2017-02-22 DIAGNOSIS — I2510 Atherosclerotic heart disease of native coronary artery without angina pectoris: Secondary | ICD-10-CM | POA: Diagnosis not present

## 2017-02-22 DIAGNOSIS — I272 Pulmonary hypertension, unspecified: Secondary | ICD-10-CM

## 2017-02-22 DIAGNOSIS — I4821 Permanent atrial fibrillation: Secondary | ICD-10-CM

## 2017-02-22 DIAGNOSIS — R0609 Other forms of dyspnea: Secondary | ICD-10-CM | POA: Diagnosis not present

## 2017-02-22 DIAGNOSIS — I482 Chronic atrial fibrillation: Secondary | ICD-10-CM

## 2017-02-22 NOTE — Progress Notes (Signed)
Cardiology Office Note   Date:  02/22/2017   ID:  Abigail Duncan, DOB November 14, 1940, MRN [id number]  PCP:  [name], MD  Cardiologist:  Dr. Irish Lack     Chief Complaint  Patient presents with  . Edema      History of Present Illness: Abigail Duncan is a 76 y.o. female who presents for volume overload/ elevated BNP on last visit.  Her lasix was changed to 40 mg BID  And hctz was stopped.  She has CKD 3 and needs follow up BMP.      She has a hx of atrial fib.permanent on coumadin.   She had complained of SOB on last visit with Dr. Irish Lack in April this year.  BNP was 1559, prior to that was 1919.  And above adjustments were made.  She has no chest pain.  She is diabetic and she tells me this is stable. She is on a statin and last LDL not calculated due to elevated TG at 445.   Today only has DOE no orthopnea.  No swelling of legs.  Remote hx of PTCA according to records. She walks at home with a cane.  Has hip and back pain.       Past Medical History:  Diagnosis Date  . Degenerative arthritis of knee 04/2001   by xray  . Sciatica of left side 12/2003   DDD L5,S1   . Spinal stenosis, lumbar 04/2004   mild on MRI at L 4-5    Past Surgical History:  Procedure Laterality Date  . benign breast tumor     Excised 40+ years ago  . COLPOSCOPY  03/1991   dysplasia  . PTCA (aka ANGIOPLASTY)  07/1993   RCA     Current Outpatient Prescriptions  Medication Sig Dispense Refill  . calcium-vitamin D (OSCAL WITH D) 500-200 MG-UNIT per tablet Take 2 tablets by mouth daily with breakfast. 180 tablet 1  . enalapril (VASOTEC) 10 MG tablet Take 1 tablet (10 mg total) by mouth daily. 90 tablet 3  . fluticasone (FLONASE) 50 MCG/ACT nasal spray Place 2 sprays into the nose daily. In each nostril daily 16 g 11  . furosemide (LASIX) 40 MG tablet Take 1 tablet (40 mg total) by mouth 2 (two) times daily. 180 tablet 3  . gabapentin (NEURONTIN) 300 MG capsule TAKE 2 CAPSULES BY MOUTH 3 TIMES  A DAY 180 capsule 5  . HYDROcodone-acetaminophen (NORCO/VICODIN) 5-325 MG tablet Take 1 tablet by mouth every 6 (six) hours as needed for moderate pain. 90 tablet 0  . metFORMIN (GLUCOPHAGE) 500 MG tablet TAKE 1 TABLET BY MOUTH EVERY DAY WITH BREAKFAST 90 tablet 1  . metoprolol tartrate (LOPRESSOR) 100 MG tablet TAKE 1 TABLET BY MOUTH TWICE A DAY 180 tablet 1  . MYRBETRIQ 25 MG TB24 tablet TAKE 1 TABLET BY MOUTH EVERY DAY 30 tablet 2  . NITROSTAT 0.4 MG SL tablet PLACE 1 TABLET (0.4 MG TOTAL) UNDER THE TONGUE EVERY 5 (FIVE) MINUTES AS NEEDED FOR CHEST PAIN. 25 tablet 1  . potassium chloride SA (K-DUR,KLOR-CON) 20 MEQ tablet Take 1 tablet (20 mEq total) by mouth daily. 90 tablet 3  . rosuvastatin (CRESTOR) 20 MG tablet TAKE 1/2 TABLET BY MOUTH ONCE DAILY 45 tablet 1  . Vitamin D, Ergocalciferol, (DRISDOL) 50000 units CAPS capsule Take 50,000 Units by mouth every 7 (seven) days.    Marland Kitchen warfarin (COUMADIN) 5 MG tablet TAKE AS DIRECTED 30 tablet 4   No current facility-administered  medications for this visit.     Allergies:   Lisinopril    Social History:  The patient  reports that she has never smoked. She has never used smokeless tobacco. She reports that she does not drink alcohol or use drugs.   Family History:  The patient's family history includes Cancer in her mother and sister; Cancer (age of onset: 35) in her father; Diabetes in her brother; Heart attack in her sister; Heart disease in her brother.    ROS:  General:no colds or fevers,  weight down 1 lb since lasix BID Skin:no rashes or ulcers HEENT:no blurred vision, no congestion CV:see HPI PUL:see HPI GI:no diarrhea constipation or melena, no indigestion GU:no hematuria, no dysuria MS:no joint pain, no claudication Neuro:no syncope, no lightheadedness Endo:+ diabetes, no thyroid disease  Wt Readings from Last 3 Encounters:  02/22/17 285 lb (129.3 kg)  01/21/17 286 lb 12.8 oz (130.1 kg)  12/11/16 282 lb 3.2 oz (128 kg)      PHYSICAL EXAM: VS:  BP 118/74   Pulse 66   Ht 5\' 3"  (1.6 m)   Wt 285 lb (129.3 kg)   LMP  (LMP Unknown)   BMI 50.49 kg/m  , BMI Body mass index is 50.49 kg/m. General:Pleasant affect, NAD Skin:Warm and dry, brisk capillary refill HEENT:normocephalic, sclera clear, mucus membranes moist Neck:supple, no JVD, no bruits  Heart:irreg irreg without murmur, gallup, rub or click Lungs:clear without rales, rare rhonchi, no wheezes LZJ:QBHAL, soft, non tender, + BS, do not palpate liver spleen or masses Ext:no to trace lower ext edema, 2+ pedal pulses, 2+ radial pulses Neuro:alert and oriented x 3, MAE, follows commands, + facial symmetry  Unable to ambulate and check sp02 but walking a few feet + SOB.    EKG:  EKG is NOT ordered today. Reviewed EKG from 01/26/17   Recent Labs: 03/20/2016: TSH 3.58 11/16/2016: ALT 10; Hemoglobin 12.5; Platelets 307 02/04/2017: BUN 33; Creatinine, Ser 1.65; NT-Pro BNP 1,559; Potassium 4.4; Sodium 136    Lipid Panel    Component Value Date/Time   CHOL 217 (H) 03/20/2016 1214   TRIG 445 (H) 03/20/2016 1214   HDL 42 (L) 03/20/2016 1214   CHOLHDL 5.2 (H) 03/20/2016 1214   VLDL NOT CALC 03/20/2016 1214   LDLCALC NOT CALC 03/20/2016 1214   LDLDIRECT 59 09/03/2009 2024       Other studies Reviewed: Additional studies/ records that were reviewed today include: . Echo 11/2016 Study Conclusions  - Left ventricle: The cavity size was normal. Wall thickness was   normal. Systolic function was normal. The estimated ejection   fraction was in the range of 60% to 65%. Wall motion was normal;   there were no regional wall motion abnormalities. - Pulmonary arteries: Systolic pressure was mildly increased. PA   peak pressure: 37 mm Hg (S). - Pericardium, extracardiac: A trivial pericardial effusion was   identified.   ASSESSMENT AND PLAN:  1.  DOE, no orthopnea, no chest pressure or tightness.  BNP was elevated with last visit and lasix increased  but not much change in symptoms.  Though she feels somewhat better.  With risk factors for CAD including diabetes and perhaps remote PTCA in 1994, will do lexiscan myoview.  Continue lasix 40 BID for now and recheck BMP. Tried to walk pt to check HR and sp02 but her pulse would not pick up.   Will also check 2 view CXR  She will follow up in 2 weeks with me or Dr.  [place] once nuc study and other tests back.   2. Permanent a fib on coumadin.  Rate controlled.   3. Remote CAD she is not sure, but PMH notes PTCA  4. HTN essential controlled.  5. Pulmonary HTN, mild on echo.  Possible OSA but last sleep study in 2016, she stated was neg.     Current medicines are reviewed with the patient today.  The patient Has no concerns regarding medicines.  The following changes have been made:  See above Labs/ tests ordered today include:see above  Disposition:   FU:  see above  Signed, Cecilie Kicks, NP  02/22/2017 4:20 PM    [facility] [place], [place], [place], [place] Phone: [phone]; Fax: [phone]

## 2017-02-22 NOTE — Patient Instructions (Signed)
Medication Instructions:  Your physician recommends that you continue on your current medications as directed. Please refer to the Current Medication list given to you today.   Labwork: Bmet and Tsh today  Testing/Procedures: Your physician has requested that you have a lexiscan myoview. For further information please visit [email or web address]. Please follow instruction sheet, as given.   A chest x-ray takes a picture of the organs and structures inside the chest, including the heart, lungs, and blood vessels. This test can show several things, including, whether the heart is enlarges; whether fluid is building up in the lungs; and whether pacemaker / defibrillator leads are still in place.  ( To be done at [address] 1st floor)  Follow-Up: Your physician recommends that you schedule a follow-up appointment after testing is complete with Dr.Varanasi or Cecilie Kicks, NP  Any Other Special Instructions Will Be Listed Below (If Applicable).     If you need a refill on your cardiac medications before your next appointment, please call your pharmacy.

## 2017-02-23 ENCOUNTER — Telehealth: Payer: Self-pay | Admitting: *Deleted

## 2017-02-23 DIAGNOSIS — Z79899 Other long term (current) drug therapy: Secondary | ICD-10-CM

## 2017-02-23 LAB — BASIC METABOLIC PANEL
BUN/Creatinine Ratio: 24 (ref 12–28)
BUN: 34 mg/dL — ABNORMAL HIGH (ref 8–27)
CALCIUM: 9.1 mg/dL (ref 8.7–10.3)
CO2: 25 mmol/L (ref 18–29)
CREATININE: 1.39 mg/dL — AB (ref 0.57–1.00)
Chloride: 100 mmol/L (ref 96–106)
GFR, EST AFRICAN AMERICAN: 43 mL/min/{1.73_m2} — AB (ref >59)
GFR, EST NON AFRICAN AMERICAN: 38 mL/min/{1.73_m2} — AB (ref >59)
Glucose: 136 mg/dL — ABNORMAL HIGH (ref 65–99)
POTASSIUM: 4.7 mmol/L (ref 3.5–5.2)
SODIUM: 142 mmol/L (ref 134–144)

## 2017-02-23 LAB — TSH: TSH: 3.13 u[IU]/mL (ref 0.450–4.500)

## 2017-02-23 NOTE — Telephone Encounter (Signed)
-----   Message from Isaiah Serge, NP sent at 02/23/2017  8:08 AM EDT ----- Labs better recheck in 2 weeks.

## 2017-03-02 ENCOUNTER — Ambulatory Visit
Admission: RE | Admit: 2017-03-02 | Discharge: 2017-03-02 | Disposition: A | Discharge status: Home / Self Care | Payer: Medicare Other | Source: Ambulatory Visit | Attending: Cardiology | Admitting: Cardiology

## 2017-03-02 ENCOUNTER — Telehealth (HOSPITAL_COMMUNITY): Payer: Self-pay | Admitting: *Deleted

## 2017-03-02 DIAGNOSIS — R0602 Shortness of breath: Secondary | ICD-10-CM | POA: Diagnosis not present

## 2017-03-02 NOTE — Telephone Encounter (Signed)
Left message on voicemail per DPR in reference to upcoming appointment scheduled on 03/04/17 at 1230 with detailed instructions given per Myocardial Perfusion Study Information Sheet for the test. LM to arrive 15 minutes early, and that it is imperative to arrive on time for appointment to keep from having the test rescheduled. If you need to cancel or reschedule your appointment, please call the office within 24 hours of your appointment. Failure to do so may result in a cancellation of your appointment, and a $50 no show fee. Phone number given for call back for any questions.

## 2017-03-03 ENCOUNTER — Ambulatory Visit (INDEPENDENT_AMBULATORY_CARE_PROVIDER_SITE_OTHER): Payer: Medicare Other | Admitting: *Deleted

## 2017-03-03 DIAGNOSIS — I4891 Unspecified atrial fibrillation: Secondary | ICD-10-CM

## 2017-03-03 DIAGNOSIS — Z7901 Long term (current) use of anticoagulants: Secondary | ICD-10-CM

## 2017-03-03 LAB — POCT INR: INR: 2.7

## 2017-03-04 ENCOUNTER — Ambulatory Visit (HOSPITAL_COMMUNITY): Payer: Medicare Other | Attending: Internal Medicine

## 2017-03-04 DIAGNOSIS — R0602 Shortness of breath: Secondary | ICD-10-CM | POA: Diagnosis not present

## 2017-03-04 DIAGNOSIS — I2510 Atherosclerotic heart disease of native coronary artery without angina pectoris: Secondary | ICD-10-CM | POA: Diagnosis not present

## 2017-03-04 DIAGNOSIS — I10 Essential (primary) hypertension: Secondary | ICD-10-CM | POA: Insufficient documentation

## 2017-03-04 DIAGNOSIS — E119 Type 2 diabetes mellitus without complications: Secondary | ICD-10-CM | POA: Insufficient documentation

## 2017-03-04 DIAGNOSIS — I4891 Unspecified atrial fibrillation: Secondary | ICD-10-CM | POA: Insufficient documentation

## 2017-03-04 MED ORDER — TECHNETIUM TC 99M TETROFOSMIN IV KIT
33.0000 | PACK | Freq: Once | INTRAVENOUS | Status: AC | PRN
  Administered 2017-03-04: 33 via INTRAVENOUS
  Filled 2017-03-04: qty 33

## 2017-03-04 MED ORDER — REGADENOSON 0.4 MG/5ML IV SOLN
0.4000 mg | Freq: Once | INTRAVENOUS | Status: AC
  Administered 2017-03-04: 0.4 mg via INTRAVENOUS

## 2017-03-05 ENCOUNTER — Ambulatory Visit (HOSPITAL_COMMUNITY): Payer: Medicare Other | Attending: Cardiovascular Disease

## 2017-03-05 ENCOUNTER — Other Ambulatory Visit: Payer: Medicare Other | Admitting: *Deleted

## 2017-03-05 DIAGNOSIS — Z79899 Other long term (current) drug therapy: Secondary | ICD-10-CM

## 2017-03-05 LAB — MYOCARDIAL PERFUSION IMAGING
CHL CUP NUCLEAR SDS: 4
CHL CUP NUCLEAR SRS: 2
LVDIAVOL: 82 mL (ref 46–106)
LVSYSVOL: 31 mL
Peak HR: 102 {beats}/min
RATE: 0.32
Rest HR: 80 {beats}/min
SSS: 6
TID: 0.98

## 2017-03-05 LAB — BASIC METABOLIC PANEL
BUN / CREAT RATIO: 18 (ref 12–28)
BUN: 26 mg/dL (ref 8–27)
CO2: 25 mmol/L (ref 18–29)
Calcium: 9.1 mg/dL (ref 8.7–10.3)
Chloride: 97 mmol/L (ref 96–106)
Creatinine, Ser: 1.43 mg/dL — ABNORMAL HIGH (ref 0.57–1.00)
GFR calc Af Amer: 42 mL/min/{1.73_m2} — ABNORMAL LOW (ref >59)
GFR, EST NON AFRICAN AMERICAN: 36 mL/min/{1.73_m2} — AB (ref >59)
GLUCOSE: 150 mg/dL — AB (ref 65–99)
Potassium: 4.5 mmol/L (ref 3.5–5.2)
SODIUM: 140 mmol/L (ref 134–144)

## 2017-03-05 MED ORDER — TECHNETIUM TC 99M TETROFOSMIN IV KIT
33.0000 | PACK | Freq: Once | INTRAVENOUS | Status: AC | PRN
  Administered 2017-03-05: 33 via INTRAVENOUS
  Filled 2017-03-05: qty 33

## 2017-03-08 ENCOUNTER — Other Ambulatory Visit: Payer: Self-pay | Admitting: *Deleted

## 2017-03-08 NOTE — Telephone Encounter (Signed)
Refill request for 90 day supply.  Leilene Diprima L, RN  

## 2017-03-09 MED ORDER — WARFARIN SODIUM 5 MG PO TABS: ORAL_TABLET | ORAL | 4 refills | Status: DC

## 2017-03-15 ENCOUNTER — Other Ambulatory Visit: Payer: Self-pay | Admitting: Family Medicine

## 2017-03-17 NOTE — Progress Notes (Signed)
Cardiology Office Note   Date:  03/18/2017   ID:  Abigail Duncan, DOB May 24, 1941, MRN [id number]  PCP:  Abigail Dawn, MD  Cardiologist:  Dr. Irish Lack     Chief Complaint  Patient presents with  . Shortness of Breath    volume overload.       History of Present Illness: Abigail Duncan is a 76 y.o. female who presents for follow up after volume overload.   Her lasix had been increased.    She has a hx of atrial fib.permanent on coumadin.   She had complained of SOB on last visit with Dr. Irish Lack in April this year.  BNP was 1559, prior to that was 1919.  And above adjustments were made.  She has no chest pain.  She is diabetic and she tells me this is stable. She is on a statin and last LDL not calculated due to elevated TG at 445.  Remote hx of PTCA.   Last visit 02/22/17  Without much change in symptoms and risk factors for CAD I asked her to have a lexiscan and CXR. Lexiscan with EF 62%, normal study.  No ischemia or infarction.   CXR with cardiomegaly and mild pulmonary vascular congestion.  Mild bronchitic changes.    Today she does feel better, she is walking with her cane and is walking farther than she did, still SOB with DOE.  No orthopnea.  She has been canning squash and pickled beets.  She continues to add salt to some foods.  We discussed importance of stopping.   Past Medical History:  Diagnosis Date  . Degenerative arthritis of knee 04/2001   by xray  . Sciatica of left side 12/2003   DDD L5,S1   . Spinal stenosis, lumbar 04/2004   mild on MRI at L 4-5    Past Surgical History:  Procedure Laterality Date  . benign breast tumor     Excised 40+ years ago  . COLPOSCOPY  03/1991   dysplasia  . PTCA (aka ANGIOPLASTY)  07/1993   RCA     Current Outpatient Prescriptions  Medication Sig Dispense Refill  . calcium-vitamin D (OSCAL WITH D) 500-200 MG-UNIT per tablet Take 2 tablets by mouth daily with breakfast. 180 tablet 1  . enalapril (VASOTEC) 10 MG tablet  Take 1 tablet (10 mg total) by mouth daily. 90 tablet 3  . furosemide (LASIX) 40 MG tablet Take 1 tablet (40 mg total) by mouth 2 (two) times daily. 180 tablet 3  . gabapentin (NEURONTIN) 300 MG capsule TAKE 2 CAPSULES BY MOUTH 3 TIMES A DAY 180 capsule 5  . HYDROcodone-acetaminophen (NORCO/VICODIN) 5-325 MG tablet Take 1 tablet by mouth every 6 (six) hours as needed for moderate pain. 90 tablet 0  . metFORMIN (GLUCOPHAGE) 500 MG tablet TAKE 1 TABLET BY MOUTH EVERY DAY WITH BREAKFAST 90 tablet 1  . metoprolol tartrate (LOPRESSOR) 100 MG tablet TAKE 1 TABLET BY MOUTH TWICE A DAY 180 tablet 1  . MYRBETRIQ 25 MG TB24 tablet TAKE 1 TABLET BY MOUTH EVERY DAY 30 tablet 2  . NITROSTAT 0.4 MG SL tablet PLACE 1 TABLET (0.4 MG TOTAL) UNDER THE TONGUE EVERY 5 (FIVE) MINUTES AS NEEDED FOR CHEST PAIN. 25 tablet 1  . potassium chloride SA (K-DUR,KLOR-CON) 20 MEQ tablet Take 1 tablet (20 mEq total) by mouth daily. 90 tablet 3  . rosuvastatin (CRESTOR) 20 MG tablet TAKE 1/2 TABLET BY MOUTH ONCE DAILY 45 tablet 1  . Vitamin D, Ergocalciferol, (  DRISDOL) 50000 units CAPS capsule Take 50,000 Units by mouth every 7 (seven) days.    Marland Kitchen warfarin (COUMADIN) 5 MG tablet TAKE AS DIRECTED 30 tablet 4  . fluticasone (FLONASE) 50 MCG/ACT nasal spray Place 2 sprays into the nose daily. In each nostril daily (Patient not taking: Reported on 03/18/2017) 16 g 11   No current facility-administered medications for this visit.     Allergies:   Lisinopril    Social History:  The patient  reports that she has never smoked. She has never used smokeless tobacco. She reports that she does not drink alcohol or use drugs.   Family History:  The patient's family history includes Cancer in her mother and sister; Cancer (age of onset: 48) in her father; Diabetes in her brother; Heart attack in her sister; Heart disease in her brother.    ROS:  General:no colds or fevers, mild weight increase Skin:no rashes or ulcers HEENT:no blurred  vision, no congestion CV:see HPI PUL:see HPI GI:no diarrhea constipation or melena, no indigestion GU:no hematuria, no dysuria MS:+ joint pain, no claudication Neuro:no syncope, no lightheadedness Endo:+ diabetes stable per pt., no thyroid disease  Wt Readings from Last 3 Encounters:  03/18/17 287 lb 1.9 oz (130.2 kg)  03/04/17 285 lb (129.3 kg)  02/22/17 285 lb (129.3 kg)     PHYSICAL EXAM: VS:  BP 126/86 (BP Location: Left Wrist, Patient Position: Sitting, Cuff Size: Large)   Pulse 88   Ht 5\' 3"  (1.6 m)   Wt 287 lb 1.9 oz (130.2 kg)   LMP  (LMP Unknown)   SpO2 96%   BMI 50.86 kg/m  , BMI Body mass index is 50.86 kg/m. General:Pleasant affect, NAD Skin:Warm and dry, brisk capillary refill HEENT:normocephalic, sclera clear, mucus membranes moist Neck:supple, no JVD, no bruits  Heart:irreg irreg without murmur, gallup, rub or click Lungs:clear without rales, rhonchi, or wheezes XNA:TFTDD, soft, non tender, + BS, do not palpate liver spleen or masses Ext:no lower ext edema, 2+ pedal pulses, 2+ radial pulses Neuro:alert and oriented X 3, MAE, follows commands, + facial symmetry    EKG:  EKG is NOT ordered today.   Recent Labs: 11/16/2016: ALT 10; Hemoglobin 12.5; Platelets 307 02/04/2017: NT-Pro BNP 1,559 02/22/2017: TSH 3.130 03/05/2017: BUN 26; Creatinine, Ser 1.43; Potassium 4.5; Sodium 140    Lipid Panel    Component Value Date/Time   CHOL 217 (H) 03/20/2016 1214   TRIG 445 (H) 03/20/2016 1214   HDL 42 (L) 03/20/2016 1214   CHOLHDL 5.2 (H) 03/20/2016 1214   VLDL NOT CALC 03/20/2016 1214   LDLCALC NOT CALC 03/20/2016 1214   LDLDIRECT 59 09/03/2009 2024       Other studies Reviewed: Additional studies/ records that were reviewed today include:  lexiscan myoview. .Study Highlights     Nuclear stress EF: 62%.  There was no ST segment deviation noted during stress.  The study is normal.  The left ventricular ejection fraction is normal (55-65%).   1.  EF 62% with normal wall motion.  2. No evidence for ischemia or infarction on perfusion images.    Normal study.     Echo 11/2016 Study Conclusions  - Left ventricle: The cavity size was normal. Wall thickness was normal. Systolic function was normal. The estimated ejection fraction was in the range of 60% to 65%. Wall motion was normal; there were no regional wall motion abnormalities. - Pulmonary arteries: Systolic pressure was mildly increased. PA peak pressure: 37 mm Hg (S). - Pericardium,  extracardiac: A trivial pericardial effusion was identified.     ASSESSMENT AND PLAN:  1.  SOB/DOE no orthopnea, overall she does feel better, is walking farther than she has been.  No chest pain.  Edema has improved.  With her risk factors and DOE we did nuc study which was neg for ischemia.   CXR was stable.  Follow up with Dr. Irish Lack in 4 months.   2.  Permanent a fib on coumadin rate controlled - INR followed at PCP.    3.  Remote CAD with PTCA, now neg nuc study for ischemia.    4.  HTN, controlled  5.  Pul. HTN mild on Echo.  Has had neg sleep study.    6. DM-2 per PCP.   7.  CKD -3 will monitor labs today and BNP to see if improved and for guidance with lasix.  She is to see nephrology soon.  Also PCP.     Current medicines are reviewed with the patient today.  The patient Has no concerns regarding medicines.  The following changes have been made:  See above Labs/ tests ordered today include:see above  Disposition:   FU:  see above  Signed, Cecilie Kicks, NP  03/18/2017 9:38 AM    [facility], [place], [place], [place] Phone: [phone]; Fax: [phone]

## 2017-03-17 NOTE — Progress Notes (Signed)
   Subjective:    Patient ID: Abigail Duncan, female    DOB: 03-27-1941, 76 y.o.   MRN: [id number]  HPI 76 y/o female presents for routine follow up.  Shortness of breath. Patient with history of PAfib and Pulmonary HTN.  Evaluated by cardiology on 03/19/17. Nuclear Stress Test ordered previously ordered to evaluate for CAD (see below). Taking lasix 40 mg BID. Reports some improvement.   Cards plans to decrease Lasix to 40 mg daily and start Metolazone 2.5 mg three times per week based on lab work/telephone note from yesterday.   Chronic Pain Currently 5/10, up to 8-9/10 without medications. Primarily of lumbar spine. Takes Norco 5/325 TID prn. Function improved with medication.   HTN Currently prescribed Enalapril 10 mg daily and Lopressor 100 mg BID, no chest pain  HM Due for DM Eye exam and Mammogram  Social Never a smoker  Review of Systems See above    Objective:   Physical Exam BP 130/72   Pulse 68   Temp 98.2 F (36.8 C) (Oral)   Ht 5\' 3"  (1.6 m)   Wt 281 lb 6.4 oz (127.6 kg)   LMP  (LMP Unknown)   SpO2 96%   BMI 49.85 kg/m   Gen: pleasant female, NAD, sitting in wheelchair Cardiac: Irregular Irregular rate, S1 and S2, no murmur Resp: CTAB, normal effort Ext: trace edema MSK: diffuse lumbar tenderness   CXR 03/02/17 IMPRESSION: 1. Cardiomegaly and mild pulmonary vascular congestion. 2. Minimal left pleural thickening or fluid. 3. Mild bronchitic changes.  Nuclear Stress 03/02/17 EF 62% with normal wall motion.  2. No evidence for ischemia or infarction on perfusion images     Assessment & Plan:  Shortness of breath Improved with Lasix. Cardiology following and plans to transition to lasix 40 mg daily and Metolazone 2.5 mg three times per week as Cr has worsened. Stress test negative for CAD.   Encounter for chronic pain management One month supply of Norco provided (to be filled in July) as she will be getting a new PCP in July. Patient encouraged to  meet new PCP and get refills of narcotics in August.   HYPERTENSION, BENIGN SYSTEMIC Well controlled on Enalapil 10 mg daily and Lopressor 100 mg BID.   Preventative health care Patient encouraged to get DM eye exam and Mammogram.

## 2017-03-18 ENCOUNTER — Encounter: Payer: Self-pay | Admitting: Cardiology

## 2017-03-18 ENCOUNTER — Ambulatory Visit (INDEPENDENT_AMBULATORY_CARE_PROVIDER_SITE_OTHER): Payer: Medicare Other | Admitting: Cardiology

## 2017-03-18 VITALS — BP 126/86 | HR 88 | Ht 63.0 in | Wt 287.1 lb

## 2017-03-18 DIAGNOSIS — N183 Chronic kidney disease, stage 3 unspecified: Secondary | ICD-10-CM

## 2017-03-18 DIAGNOSIS — R0602 Shortness of breath: Secondary | ICD-10-CM | POA: Diagnosis not present

## 2017-03-18 DIAGNOSIS — R0609 Other forms of dyspnea: Secondary | ICD-10-CM | POA: Diagnosis not present

## 2017-03-18 DIAGNOSIS — I10 Essential (primary) hypertension: Secondary | ICD-10-CM | POA: Diagnosis not present

## 2017-03-18 DIAGNOSIS — I272 Pulmonary hypertension, unspecified: Secondary | ICD-10-CM | POA: Diagnosis not present

## 2017-03-18 DIAGNOSIS — I4821 Permanent atrial fibrillation: Secondary | ICD-10-CM

## 2017-03-18 DIAGNOSIS — I2510 Atherosclerotic heart disease of native coronary artery without angina pectoris: Secondary | ICD-10-CM | POA: Diagnosis not present

## 2017-03-18 DIAGNOSIS — I482 Chronic atrial fibrillation: Secondary | ICD-10-CM | POA: Diagnosis not present

## 2017-03-18 DIAGNOSIS — Z7901 Long term (current) use of anticoagulants: Secondary | ICD-10-CM | POA: Diagnosis not present

## 2017-03-18 LAB — BASIC METABOLIC PANEL
BUN / CREAT RATIO: 17 (ref 12–28)
BUN: 26 mg/dL (ref 8–27)
CO2: 23 mmol/L (ref 20–29)
Calcium: 8.6 mg/dL — ABNORMAL LOW (ref 8.7–10.3)
Chloride: 96 mmol/L (ref 96–106)
Creatinine, Ser: 1.56 mg/dL — ABNORMAL HIGH (ref 0.57–1.00)
GFR, EST AFRICAN AMERICAN: 38 mL/min/{1.73_m2} — AB (ref >59)
GFR, EST NON AFRICAN AMERICAN: 33 mL/min/{1.73_m2} — AB (ref >59)
Glucose: 195 mg/dL — ABNORMAL HIGH (ref 65–99)
POTASSIUM: 4.6 mmol/L (ref 3.5–5.2)
SODIUM: 139 mmol/L (ref 134–144)

## 2017-03-18 LAB — PRO B NATRIURETIC PEPTIDE: NT-Pro BNP: 1382 pg/mL — ABNORMAL HIGH (ref 0–301)

## 2017-03-18 NOTE — Patient Instructions (Signed)
Medication Instructions:    Your physician recommends that you continue on your current medications as directed. Please refer to the Current Medication list given to you today.   If you need a refill on your cardiac medications before your next appointment, please call your pharmacy.  Labwork:  BMET AND BNP TODAY    Testing/Procedures: NONE ORDERED  TODAY    Follow-Up: Your physician wants you to follow-up in:  IN 4  MONTHS WITH DR Irish Lack   You will receive a reminder letter in the mail two months in advance. If you don't receive a letter, please call our office to schedule the follow-up appointment.      Any Other Special Instructions Will Be Listed Below (If Applicable).

## 2017-03-19 ENCOUNTER — Encounter: Payer: Self-pay | Admitting: Family Medicine

## 2017-03-19 ENCOUNTER — Ambulatory Visit (INDEPENDENT_AMBULATORY_CARE_PROVIDER_SITE_OTHER): Payer: Medicare Other | Admitting: Family Medicine

## 2017-03-19 ENCOUNTER — Telehealth: Payer: Self-pay | Admitting: Cardiology

## 2017-03-19 DIAGNOSIS — M5136 Other intervertebral disc degeneration, lumbar region: Secondary | ICD-10-CM | POA: Diagnosis not present

## 2017-03-19 DIAGNOSIS — I4891 Unspecified atrial fibrillation: Secondary | ICD-10-CM

## 2017-03-19 DIAGNOSIS — I10 Essential (primary) hypertension: Secondary | ICD-10-CM

## 2017-03-19 DIAGNOSIS — G8929 Other chronic pain: Secondary | ICD-10-CM | POA: Diagnosis not present

## 2017-03-19 DIAGNOSIS — Z0000 Encounter for general adult medical examination without abnormal findings: Secondary | ICD-10-CM

## 2017-03-19 DIAGNOSIS — Z7901 Long term (current) use of anticoagulants: Secondary | ICD-10-CM | POA: Diagnosis not present

## 2017-03-19 DIAGNOSIS — I2510 Atherosclerotic heart disease of native coronary artery without angina pectoris: Secondary | ICD-10-CM

## 2017-03-19 DIAGNOSIS — R0602 Shortness of breath: Secondary | ICD-10-CM | POA: Diagnosis not present

## 2017-03-19 LAB — POCT INR: INR: 3

## 2017-03-19 MED ORDER — HYDROCODONE-ACETAMINOPHEN 5-325 MG PO TABS
1.0000 | ORAL_TABLET | Freq: Four times a day (QID) | ORAL | 0 refills | Status: DC | PRN
Start: 2017-05-19 — End: 2017-05-20

## 2017-03-19 MED ORDER — METOLAZONE 2.5 MG PO TABS: 2.5000 mg | ORAL_TABLET | ORAL | 3 refills | Status: DC

## 2017-03-19 MED ORDER — FUROSEMIDE 40 MG PO TABS: 40.0000 mg | ORAL_TABLET | Freq: Every day | ORAL | 3 refills | Status: DC

## 2017-03-19 NOTE — Telephone Encounter (Signed)
Patient returning your call, thanks. °

## 2017-03-19 NOTE — Assessment & Plan Note (Signed)
One month supply of Norco provided (to be filled in July) as she will be getting a new PCP in July. Patient encouraged to meet new PCP and get refills of narcotics in August.

## 2017-03-19 NOTE — Assessment & Plan Note (Signed)
Well controlled on Enalapil 10 mg daily and Lopressor 100 mg BID.

## 2017-03-19 NOTE — Telephone Encounter (Signed)
-----   Message from Isaiah Serge, NP sent at 03/18/2017  4:50 PM EDT ----- Kidney function is stressed some, change lasix to once a day and add metolazone 2.5 mg daily before lasix on MWF - recheck BMP in a week.  Thanks.

## 2017-03-19 NOTE — Telephone Encounter (Signed)
Informed patient of results and verbal understanding expressed.  Instructed patient to DECREASE LASIX to daily. Instructed patient to START METOLAZONE 2.5 mg on WMF before Lasix. BMET scheduled June 25. Patient agrees with treatment plan.

## 2017-03-19 NOTE — Assessment & Plan Note (Signed)
Patient encouraged to get DM eye exam and Mammogram.

## 2017-03-19 NOTE — Patient Instructions (Addendum)
It has been a pleasure to care for you.   Fluid Pill - Cardiology plans to decrease the Lasix to 40 mg daily and start metolazone 2.5 three days per week. Please call the Cardiologist and ask about these medications.   Please make an appointment to see your eye doctor and for a mammogram.   Please make an appointment with you new doctor in August.

## 2017-03-19 NOTE — Assessment & Plan Note (Signed)
Improved with Lasix. Cardiology following and plans to transition to lasix 40 mg daily and Metolazone 2.5 mg three times per week as Cr has worsened. Stress test negative for CAD.

## 2017-03-29 ENCOUNTER — Other Ambulatory Visit: Payer: Medicare Other

## 2017-03-29 DIAGNOSIS — I10 Essential (primary) hypertension: Secondary | ICD-10-CM

## 2017-03-30 ENCOUNTER — Telehealth: Payer: Self-pay | Admitting: *Deleted

## 2017-03-30 DIAGNOSIS — Z79899 Other long term (current) drug therapy: Secondary | ICD-10-CM

## 2017-03-30 LAB — BASIC METABOLIC PANEL
BUN / CREAT RATIO: 20 (ref 12–28)
BUN: 37 mg/dL — AB (ref 8–27)
CALCIUM: 8.8 mg/dL (ref 8.7–10.3)
CO2: 19 mmol/L — AB (ref 20–29)
CREATININE: 1.86 mg/dL — AB (ref 0.57–1.00)
Chloride: 97 mmol/L (ref 96–106)
GFR calc Af Amer: 30 mL/min/{1.73_m2} — ABNORMAL LOW (ref >59)
GFR calc non Af Amer: 26 mL/min/{1.73_m2} — ABNORMAL LOW (ref >59)
GLUCOSE: 266 mg/dL — AB (ref 65–99)
Potassium: 4.6 mmol/L (ref 3.5–5.2)
Sodium: 142 mmol/L (ref 134–144)

## 2017-03-30 NOTE — Telephone Encounter (Signed)
Pt lab order.

## 2017-03-31 ENCOUNTER — Ambulatory Visit: Payer: Medicare Other

## 2017-04-06 ENCOUNTER — Other Ambulatory Visit: Payer: Medicare Other | Admitting: *Deleted

## 2017-04-06 DIAGNOSIS — Z79899 Other long term (current) drug therapy: Secondary | ICD-10-CM

## 2017-04-06 LAB — BASIC METABOLIC PANEL
BUN/Creatinine Ratio: 23 (ref 12–28)
BUN: 35 mg/dL — ABNORMAL HIGH (ref 8–27)
CHLORIDE: 92 mmol/L — AB (ref 96–106)
CO2: 24 mmol/L (ref 20–29)
Calcium: 9 mg/dL (ref 8.7–10.3)
Creatinine, Ser: 1.53 mg/dL — ABNORMAL HIGH (ref 0.57–1.00)
GFR calc Af Amer: 39 mL/min/{1.73_m2} — ABNORMAL LOW (ref >59)
GFR calc non Af Amer: 34 mL/min/{1.73_m2} — ABNORMAL LOW (ref >59)
Glucose: 198 mg/dL — ABNORMAL HIGH (ref 65–99)
POTASSIUM: 4.5 mmol/L (ref 3.5–5.2)
Sodium: 135 mmol/L (ref 134–144)

## 2017-04-08 ENCOUNTER — Telehealth: Payer: Self-pay | Admitting: *Deleted

## 2017-04-08 DIAGNOSIS — Z79899 Other long term (current) drug therapy: Secondary | ICD-10-CM

## 2017-04-08 NOTE — Telephone Encounter (Signed)
-----   Message from Isaiah Serge, NP sent at 04/06/2017  4:53 PM EDT ----- Labs more at baseline continue current dose of diuretics. Has her edema improved?  Recheck bmp in 1 month

## 2017-04-11 ENCOUNTER — Other Ambulatory Visit: Payer: Self-pay | Admitting: Family Medicine

## 2017-04-11 DIAGNOSIS — N3941 Urge incontinence: Secondary | ICD-10-CM

## 2017-04-15 ENCOUNTER — Ambulatory Visit: Payer: Medicare Other

## 2017-04-19 ENCOUNTER — Other Ambulatory Visit: Payer: Self-pay | Admitting: *Deleted

## 2017-04-20 MED ORDER — ROSUVASTATIN CALCIUM 20 MG PO TABS: 10.0000 mg | ORAL_TABLET | Freq: Every day | ORAL | 99 refills | Status: DC

## 2017-04-22 ENCOUNTER — Ambulatory Visit: Payer: Medicare Other

## 2017-04-23 MED ORDER — METFORMIN HCL 500 MG PO TABS: ORAL_TABLET | ORAL | 1 refills | Status: DC

## 2017-04-26 ENCOUNTER — Ambulatory Visit: Payer: Medicare Other

## 2017-04-30 ENCOUNTER — Ambulatory Visit (INDEPENDENT_AMBULATORY_CARE_PROVIDER_SITE_OTHER): Payer: Medicare Other | Admitting: *Deleted

## 2017-04-30 DIAGNOSIS — I4891 Unspecified atrial fibrillation: Secondary | ICD-10-CM | POA: Diagnosis not present

## 2017-04-30 DIAGNOSIS — Z7901 Long term (current) use of anticoagulants: Secondary | ICD-10-CM

## 2017-04-30 LAB — POCT INR: INR: 2.7

## 2017-05-20 ENCOUNTER — Encounter: Payer: Self-pay | Admitting: Family Medicine

## 2017-05-20 ENCOUNTER — Ambulatory Visit (INDEPENDENT_AMBULATORY_CARE_PROVIDER_SITE_OTHER): Payer: Medicare Other | Admitting: Family Medicine

## 2017-05-20 VITALS — BP 124/64 | HR 68 | Temp 97.9°F | Ht 63.0 in

## 2017-05-20 DIAGNOSIS — N3941 Urge incontinence: Secondary | ICD-10-CM | POA: Diagnosis not present

## 2017-05-20 DIAGNOSIS — E1142 Type 2 diabetes mellitus with diabetic polyneuropathy: Secondary | ICD-10-CM

## 2017-05-20 DIAGNOSIS — E119 Type 2 diabetes mellitus without complications: Secondary | ICD-10-CM

## 2017-05-20 DIAGNOSIS — N183 Chronic kidney disease, stage 3 unspecified: Secondary | ICD-10-CM

## 2017-05-20 DIAGNOSIS — I2510 Atherosclerotic heart disease of native coronary artery without angina pectoris: Secondary | ICD-10-CM | POA: Diagnosis not present

## 2017-05-20 DIAGNOSIS — G8929 Other chronic pain: Secondary | ICD-10-CM | POA: Diagnosis not present

## 2017-05-20 DIAGNOSIS — M5136 Other intervertebral disc degeneration, lumbar region: Secondary | ICD-10-CM

## 2017-05-20 DIAGNOSIS — Z7901 Long term (current) use of anticoagulants: Secondary | ICD-10-CM

## 2017-05-20 DIAGNOSIS — E1149 Type 2 diabetes mellitus with other diabetic neurological complication: Secondary | ICD-10-CM

## 2017-05-20 DIAGNOSIS — R0602 Shortness of breath: Secondary | ICD-10-CM | POA: Diagnosis not present

## 2017-05-20 DIAGNOSIS — I5040 Unspecified combined systolic (congestive) and diastolic (congestive) heart failure: Secondary | ICD-10-CM

## 2017-05-20 DIAGNOSIS — I4891 Unspecified atrial fibrillation: Secondary | ICD-10-CM | POA: Diagnosis not present

## 2017-05-20 DIAGNOSIS — I272 Pulmonary hypertension, unspecified: Secondary | ICD-10-CM

## 2017-05-20 DIAGNOSIS — I10 Essential (primary) hypertension: Secondary | ICD-10-CM

## 2017-05-20 DIAGNOSIS — I130 Hypertensive heart and chronic kidney disease with heart failure and stage 1 through stage 4 chronic kidney disease, or unspecified chronic kidney disease: Secondary | ICD-10-CM | POA: Diagnosis not present

## 2017-05-20 DIAGNOSIS — E669 Obesity, unspecified: Secondary | ICD-10-CM

## 2017-05-20 DIAGNOSIS — Z7409 Other reduced mobility: Secondary | ICD-10-CM

## 2017-05-20 LAB — POCT INR: INR: 3.2

## 2017-05-20 LAB — POCT GLYCOSYLATED HEMOGLOBIN (HGB A1C): HEMOGLOBIN A1C: 8.5

## 2017-05-20 MED ORDER — HYDROCODONE-ACETAMINOPHEN 5-325 MG PO TABS: 1.0000 | ORAL_TABLET | Freq: Four times a day (QID) | ORAL | 0 refills | Status: DC | PRN

## 2017-05-20 MED ORDER — GABAPENTIN 300 MG PO CAPS: 900.0000 mg | ORAL_CAPSULE | Freq: Three times a day (TID) | ORAL | 5 refills | Status: DC

## 2017-05-20 NOTE — Patient Instructions (Signed)
Your blood pressure looks good. Will meet again at my [facility] in about a month.

## 2017-05-21 ENCOUNTER — Encounter: Payer: Self-pay | Admitting: Family Medicine

## 2017-05-21 DIAGNOSIS — N183 Chronic kidney disease, stage 3 (moderate): Secondary | ICD-10-CM | POA: Diagnosis not present

## 2017-05-21 DIAGNOSIS — D631 Anemia in chronic kidney disease: Secondary | ICD-10-CM | POA: Diagnosis not present

## 2017-05-21 DIAGNOSIS — N2581 Secondary hyperparathyroidism of renal origin: Secondary | ICD-10-CM | POA: Diagnosis not present

## 2017-05-21 DIAGNOSIS — Z7409 Other reduced mobility: Secondary | ICD-10-CM

## 2017-05-21 HISTORY — DX: Other reduced mobility: Z74.09

## 2017-05-21 LAB — CBC AND DIFFERENTIAL
HCT: 34 — AB (ref 36–46)
HEMOGLOBIN: 11.4 — AB (ref 12.0–16.0)
PLATELETS: 318 (ref 150–399)
WBC: 8.7

## 2017-05-21 LAB — BASIC METABOLIC PANEL
BUN: 38 — AB (ref 4–21)
Creatinine: 1.5 — AB (ref 0.5–1.1)
Glucose: 247
Potassium: 4.7 (ref 3.4–5.3)
Sodium: 137 (ref 137–147)

## 2017-05-21 LAB — VITAMIN D 25 HYDROXY (VIT D DEFICIENCY, FRACTURES): Vit D, 25-Hydroxy: 28.1

## 2017-05-21 MED ORDER — GABAPENTIN 300 MG PO CAPS: 600.0000 mg | ORAL_CAPSULE | Freq: Three times a day (TID) | ORAL | 5 refills | Status: DC

## 2017-05-21 NOTE — Assessment & Plan Note (Signed)
Established problem Controlled Continue current therapy regiment. Intact sensation to monofilaments 5/5 bilat

## 2017-05-21 NOTE — Assessment & Plan Note (Signed)
Rate controlled See anticoagulation problem for A/P

## 2017-05-21 NOTE — Progress Notes (Signed)
Subjective:    Patient ID: Abigail Duncan, female    DOB: Jul 24, 1941, 76 y.o.   MRN: [id number] TAHIRI SHAREEF is alone Sources of clinical information for visit is/are patient and past medical records. Nursing assessment for this office visit was reviewed with the patient for accuracy and revision.   HPI Problem List Items Addressed This Visit      High   Type 2 diabetes mellitus, controlled ([place]) - Primary CHRONIC DIABETES  Disease Monitoring  Blood Sugar Ranges: not checking at home  Polyuria: yes, attributes to Lasix and Metalozone meds   Visual problems: no   Medication Compliance: yes, metformin 500 mg daily  Medication Side Effects  Hypoglycemia: no   Preventitive Health Care             Daily Aspirin: on wrafarin              Statin: yes             Dental evaluation in 20-month:             Recent eGFR: 34 ml/min (04/06/17) [facility]              ACEI: Enalopril  Eye Exam: not in 2 years  Foot Exam: today  Diet pattern: fair in fats and CH2O  Exercise: none.  Able perform ADLs independently and iADLs with assistance     Relevant Orders   HgB A1c (Completed)   Obesity, Class III, BMI 40-49.9 (morbid obesity) (HCC) - inactivity and excess caloric intake - longstanding problem.   Long term current use of anticoagulant therapy - Warfarin 5 mg tablet; One tab MWF; half tablet (2.5 mg) TThSaSu - No bleeding - no melena, no BRBPR, no epistaxis or bleeding gums   Diabetic peripheral neuropathy (HCC) - painful neuropathy for years.  No worsening - responds fair to gabapentin 600 mg TID.  - denies abdominal pain, onstipation, dizziness, edema, headache, rash,, drowsiness   Relevant Medications   gabapentin (NEURONTIN) 300 MG capsule   Encounter for chronic pain management - Agreed to sign pain contract.   Pulmonary hypertension ([place] - Since at least TTE 2016. - No known lung disease.  Normal Sleep study in 2016. TTE 11/2016 does not mention  diastolic dysfunction. - [facility] treating pt for volume overload with pulmonary congeston on CXR and peripheral edema with Lasix and metalozone. BNP  1559 prior was 1919. Patient with Stage IIIb CKD. - 05/18 Recent Lexiscan with EF 62% without evidence of ischemia. CXR with mild pulmnoary congestion     Medium   HYPERTENSION, BENIGN SYSTEMIC .CHRONIC HYPERTENSION  Disease Monitoring  Blood pressure range: not checking at home  Chest pain: no   Dyspnea: yes   Claudication: no, not walking more than couple steps   Medication compliance: yes  Medication Side Effects  Lightheadedness: no   Urinary frequency: yes, on lasix   Edema: yes, improved with lasix  Preventitive Healthcare:  Exercise: no   Diet Pattern: super obesity status  Salt Restriction: no     DDD (degenerative disc disease), lumbar -- Longstanding osteoarthritis of the bilateral knees, of hips, of lumbar spine with  doing fair with Norco -  Has not taken PT is a long time - Pain is on the  lumbar spine right greater than left  - Quality: aching - Pattern: constant -- course: stable Associated symptoms: Includes stiffness and weakness. There is no sleep loss and no instability.  Hip Pain:  right buttock region Radicular type pain: no  Assistive devices: 4 prong cane at home. Able to perform ADLs independently   Relevant Medications   HYDROcodone-acetaminophen (NORCO/VICODIN) 5-325 MG tablet   HYDROcodone-acetaminophen (NORCO) 5-325 MG tablet   HYDROcodone-acetaminophen (NORCO) 5-325 MG tablet     Low   INCONTINENCE, URGE - longstanding problem - no improviement with Myrbetriq.  Not interested in pursuing urology consultation at this time.      Unprioritized   Coronary atherosclerosis - History of PCTA - No chest pain - 02/2015 Lexiscan without evidence of ischemia      SH: smoking hisotry noted  Review of Systems See hpi    Objective:   Physical Exam VS reviewed GEN: Alert, Cooperative,  Groomed, NAD, sitting in WC HEENT: PERRL; EAC bilaterally not occluded, TM's translucent with normal LM COR: irr irr, No M/G/R, No JVD, Normal PMI size and location LUNGS: BCTA, No Acc mm use, speaking in full sentences ABDOMEN: (+)BS, soft, NT, ND, No HSM, No palpable masses EXT: trace pretibial peripheral leg edema.  Neuro: Oriented to person, place, and time; Stood using arms of WC.  GaitPetite ped steps without lift/swing phases.  Fearful even though holding onto examiner. Not leaning on assisting examiner, minimal assist.   Psych: Normal affect/thought/speech/language    Diabetic Foot Exam - Simple   Simple Foot Form Diabetic Foot exam was performed with the following findings:  Yes 05/20/2017 12:00 PM  Visual Inspection No deformities, no ulcerations, no other skin breakdown bilaterally:  Yes Sensation Testing Intact to touch and monofilament testing bilaterally:  Yes Pulse Check Posterior Tibialis and Dorsalis pulse intact bilaterally:  Yes Comments Thickened nails       Assessment & Plan:  See problem list

## 2017-05-21 NOTE — Assessment & Plan Note (Signed)
[place] CSRS db reviewed.  No evidence doctor shopping. Continue Dr Jesse Sans Norco regiment Rx Norco 390 now, 30d, and 60 d. RTC 3 months.  Need new control substance contract signed next visit and opiate urine screen next visit.

## 2017-05-21 NOTE — Assessment & Plan Note (Signed)
INR 3.2 on 25 mg daily. Continue current dosing.  Recheck INR in 2 weeks.

## 2017-05-21 NOTE — Assessment & Plan Note (Signed)
New problem RTC [facility] Need to assess SaO2 with wlaking effort on Room Air.

## 2017-05-21 NOTE — Assessment & Plan Note (Addendum)
TTEcho 11/2016:  estimated ejection fraction was in the range of 60% to 65%. Wall motion was normal; Pulmonary arteries: Systolic pressure was mildly increased. PA   peak pressure: 37 mm Hg (S). No known chronic lung issues.  Pt had elevated PAP to 49 mmHg on TTE 04/2015.  2016 Sleep study showed some apnea and nighttime desaturations. However, did not meet criteria for CPAP.   Working explanation is that hypertension is related to her morbid obesity (BMI 50%) causing mechanical functional restriction of chest motion. Possible Obesity Hyperventilation Syndrome with perhaps nocturnal hypoxia.  Total serum CO2 WNL and no polycythemia does not support OHS diagnosis.  Recent BNP elevated in 1500 range.  Patient unable to ambulate more than 5 feet from chair during exam because of fear of imbalance. .  Patient Did not desat on pulse ox (RA) is this short distance and effort.  Consider repeat polysomnography   Dr Ree Kida offered nighttime oxygen in 09/19/2015 which pt said she would consider.  Pt is no on home oxygen at this time.   Can readdress next visit. Would need a home nocturnal oxygen saturation assessment

## 2017-05-21 NOTE — Assessment & Plan Note (Signed)
Established problem Controlled and allowing pt ability to complete ADLs and iADLs in efficient fashion.  No significant ADE identifies. No aberrant behaviors demostrated Continue opioid analgesic current therapy regiment. Norco 5/325  #90 tab rxs now, 30d, and 60 d

## 2017-05-21 NOTE — Assessment & Plan Note (Signed)
Established problem. Stable. Follow up with [facility] coming up this month.

## 2017-05-21 NOTE — Assessment & Plan Note (Signed)
Established problem Uncontrolled Myrbetriq not helping and costing $45/month to pt. Partial contribution to urinary incotninence is function because of patient's limited mobility and from current Lasix/metolazone therapy by cardiology for patient's volume overload.   Recommendation: Stop Myrbetriq Pt decline consultation with urology. Reaqddress in future

## 2017-05-21 NOTE — Assessment & Plan Note (Signed)
Adequate blood pressure control.  No evidence of new end organ damage.  Tolerating medication without significant adverse effects.  Plan to continue current blood pressure regiment.   

## 2017-05-21 NOTE — Assessment & Plan Note (Addendum)
Lab Results  Component Value Date   HGBA1C 8.5 05/20/2017  Uncontrolled Goal A1c less than 8.0% Increase metformin to 500 mg BID, maximize as necessary.  Other possible antidiabetic meds with weight loss: GLP-1 agonist, SGLT2 and weight loss neutral DDP4 inh Recommend pt arrange her ophth evaluation ? Dietary referral as pt in Super obesity category

## 2017-05-26 DIAGNOSIS — N183 Chronic kidney disease, stage 3 (moderate): Secondary | ICD-10-CM | POA: Diagnosis not present

## 2017-05-26 DIAGNOSIS — D631 Anemia in chronic kidney disease: Secondary | ICD-10-CM | POA: Diagnosis not present

## 2017-05-26 DIAGNOSIS — I10 Essential (primary) hypertension: Secondary | ICD-10-CM | POA: Diagnosis not present

## 2017-05-26 DIAGNOSIS — N2581 Secondary hyperparathyroidism of renal origin: Secondary | ICD-10-CM | POA: Diagnosis not present

## 2017-05-31 ENCOUNTER — Encounter: Payer: Self-pay | Admitting: Family Medicine

## 2017-05-31 DIAGNOSIS — N2581 Secondary hyperparathyroidism of renal origin: Secondary | ICD-10-CM

## 2017-05-31 DIAGNOSIS — D638 Anemia in other chronic diseases classified elsewhere: Secondary | ICD-10-CM | POA: Insufficient documentation

## 2017-05-31 HISTORY — DX: Secondary hyperparathyroidism of renal origin: N25.81

## 2017-05-31 LAB — PTH, INTACT

## 2017-05-31 LAB — VITAMIN D 25 HYDROXY (VIT D DEFICIENCY, FRACTURES)

## 2017-06-14 ENCOUNTER — Other Ambulatory Visit: Payer: Self-pay | Admitting: Family Medicine

## 2017-06-14 MED ORDER — WARFARIN SODIUM 5 MG PO TABS: ORAL_TABLET | ORAL | 0 refills | Status: DC

## 2017-06-24 ENCOUNTER — Ambulatory Visit: Payer: Medicare Other

## 2017-06-24 ENCOUNTER — Telehealth: Payer: Self-pay | Admitting: *Deleted

## 2017-06-24 MED ORDER — METFORMIN HCL 500 MG PO TABS: 500.0000 mg | ORAL_TABLET | Freq: Two times a day (BID) | ORAL | 3 refills | Status: DC

## 2017-06-24 NOTE — Telephone Encounter (Signed)
Received faxed request for new Rx for metformin 500 mg BID to reflect dose change at 8/16 OV. Rx currently on file at [facility] is for once daily. Hubbard Hartshorn, RN, BSN

## 2017-06-24 NOTE — Telephone Encounter (Signed)
Rx for metformin 500 mg BID sent into pharmacy

## 2017-06-29 ENCOUNTER — Ambulatory Visit (INDEPENDENT_AMBULATORY_CARE_PROVIDER_SITE_OTHER): Payer: Medicare Other | Admitting: *Deleted

## 2017-06-29 DIAGNOSIS — Z7901 Long term (current) use of anticoagulants: Secondary | ICD-10-CM | POA: Diagnosis not present

## 2017-06-29 DIAGNOSIS — I4891 Unspecified atrial fibrillation: Secondary | ICD-10-CM

## 2017-06-29 DIAGNOSIS — Z23 Encounter for immunization: Secondary | ICD-10-CM | POA: Diagnosis not present

## 2017-06-29 LAB — POCT INR: INR: 2.3

## 2017-07-22 ENCOUNTER — Other Ambulatory Visit: Payer: Self-pay | Admitting: Family Medicine

## 2017-07-22 DIAGNOSIS — M5136 Other intervertebral disc degeneration, lumbar region: Secondary | ICD-10-CM

## 2017-07-22 NOTE — Telephone Encounter (Signed)
Needs refills on hydrocodone. She will run out 08-20-17. She tried to make an appt but there are none available. Please advise.

## 2017-07-26 MED ORDER — HYDROCODONE-ACETAMINOPHEN 5-325 MG PO TABS
1.0000 | ORAL_TABLET | Freq: Four times a day (QID) | ORAL | 0 refills | Status: DC | PRN
Start: 2017-08-19 — End: 2017-09-09

## 2017-07-26 NOTE — Telephone Encounter (Signed)
Please let patient know her prescription(s) are available for pick up from the [facility] front desk.  Rx for hydrocodone/apa 5/325 #90 Disp on 08/20/15.  No further refills until seen by Dr Deyon Chizek

## 2017-07-27 NOTE — Telephone Encounter (Signed)
Patient informed and will plan to make an appointment to see PCP in December since he has no openings currently. Jazmin Hartsell,CMA

## 2017-07-28 ENCOUNTER — Other Ambulatory Visit: Payer: Self-pay | Admitting: Family Medicine

## 2017-07-28 MED ORDER — NITROGLYCERIN 0.4 MG SL SUBL: SUBLINGUAL_TABLET | SUBLINGUAL | 1 refills | Status: AC

## 2017-08-03 ENCOUNTER — Ambulatory Visit: Payer: Medicare Other

## 2017-08-05 ENCOUNTER — Ambulatory Visit: Payer: Medicare Other | Admitting: Internal Medicine

## 2017-08-05 ENCOUNTER — Ambulatory Visit: Payer: Medicare Other

## 2017-08-13 ENCOUNTER — Ambulatory Visit (INDEPENDENT_AMBULATORY_CARE_PROVIDER_SITE_OTHER): Payer: Medicare Other | Admitting: *Deleted

## 2017-08-13 DIAGNOSIS — Z7901 Long term (current) use of anticoagulants: Secondary | ICD-10-CM

## 2017-08-13 DIAGNOSIS — E1149 Type 2 diabetes mellitus with other diabetic neurological complication: Secondary | ICD-10-CM | POA: Diagnosis not present

## 2017-08-13 DIAGNOSIS — I4891 Unspecified atrial fibrillation: Secondary | ICD-10-CM | POA: Diagnosis not present

## 2017-08-13 LAB — POCT GLYCOSYLATED HEMOGLOBIN (HGB A1C): Hemoglobin A1C: 7.6

## 2017-08-14 LAB — PROTIME-INR
INR: 2.1 — AB (ref 0.8–1.2)
PROTHROMBIN TIME: 20.6 s — AB (ref 9.1–12.0)

## 2017-08-16 ENCOUNTER — Other Ambulatory Visit: Payer: Self-pay | Admitting: Family Medicine

## 2017-08-16 MED ORDER — METOPROLOL TARTRATE 100 MG PO TABS: 100.0000 mg | ORAL_TABLET | Freq: Two times a day (BID) | ORAL | 1 refills | Status: DC

## 2017-09-09 ENCOUNTER — Encounter: Payer: Self-pay | Admitting: Family Medicine

## 2017-09-09 ENCOUNTER — Ambulatory Visit (INDEPENDENT_AMBULATORY_CARE_PROVIDER_SITE_OTHER): Payer: Medicare Other | Admitting: Family Medicine

## 2017-09-09 ENCOUNTER — Other Ambulatory Visit: Payer: Self-pay

## 2017-09-09 VITALS — BP 130/70 | HR 76 | Temp 98.4°F | Ht 63.0 in | Wt 285.0 lb

## 2017-09-09 DIAGNOSIS — I4891 Unspecified atrial fibrillation: Secondary | ICD-10-CM

## 2017-09-09 DIAGNOSIS — I5040 Unspecified combined systolic (congestive) and diastolic (congestive) heart failure: Secondary | ICD-10-CM

## 2017-09-09 DIAGNOSIS — I129 Hypertensive chronic kidney disease with stage 1 through stage 4 chronic kidney disease, or unspecified chronic kidney disease: Secondary | ICD-10-CM

## 2017-09-09 DIAGNOSIS — Z135 Encounter for screening for eye and ear disorders: Secondary | ICD-10-CM | POA: Diagnosis not present

## 2017-09-09 DIAGNOSIS — Z1239 Encounter for other screening for malignant neoplasm of breast: Secondary | ICD-10-CM

## 2017-09-09 DIAGNOSIS — Z7901 Long term (current) use of anticoagulants: Secondary | ICD-10-CM | POA: Diagnosis not present

## 2017-09-09 DIAGNOSIS — I130 Hypertensive heart and chronic kidney disease with heart failure and stage 1 through stage 4 chronic kidney disease, or unspecified chronic kidney disease: Secondary | ICD-10-CM

## 2017-09-09 DIAGNOSIS — Z1231 Encounter for screening mammogram for malignant neoplasm of breast: Secondary | ICD-10-CM | POA: Diagnosis not present

## 2017-09-09 DIAGNOSIS — N183 Chronic kidney disease, stage 3 unspecified: Secondary | ICD-10-CM

## 2017-09-09 DIAGNOSIS — E1142 Type 2 diabetes mellitus with diabetic polyneuropathy: Secondary | ICD-10-CM | POA: Diagnosis not present

## 2017-09-09 DIAGNOSIS — M5136 Other intervertebral disc degeneration, lumbar region: Secondary | ICD-10-CM

## 2017-09-09 DIAGNOSIS — M159 Polyosteoarthritis, unspecified: Secondary | ICD-10-CM

## 2017-09-09 DIAGNOSIS — E1149 Type 2 diabetes mellitus with other diabetic neurological complication: Secondary | ICD-10-CM | POA: Diagnosis not present

## 2017-09-09 DIAGNOSIS — G8929 Other chronic pain: Secondary | ICD-10-CM

## 2017-09-09 DIAGNOSIS — E1121 Type 2 diabetes mellitus with diabetic nephropathy: Secondary | ICD-10-CM

## 2017-09-09 DIAGNOSIS — E1122 Type 2 diabetes mellitus with diabetic chronic kidney disease: Secondary | ICD-10-CM

## 2017-09-09 DIAGNOSIS — M150 Primary generalized (osteo)arthritis: Secondary | ICD-10-CM | POA: Diagnosis not present

## 2017-09-09 DIAGNOSIS — G2581 Restless legs syndrome: Secondary | ICD-10-CM | POA: Diagnosis not present

## 2017-09-09 LAB — POCT INR: INR: 3

## 2017-09-09 MED ORDER — HYDROCODONE-ACETAMINOPHEN 5-325 MG PO TABS: 1.0000 | ORAL_TABLET | Freq: Four times a day (QID) | ORAL | 0 refills | Status: DC | PRN

## 2017-09-09 NOTE — Patient Instructions (Signed)
Decrease your metformin to one tablet a day We will recheck your A1c in 3 months to see if you have good enough sugar control taking just one metformin.   We will schedule you with an Eye Doctort so an eye check.  Mammogram people will call you about getting your mammogram.   Keep taking you Warfarin as you are.  Lets see you back to check your INR in 4 weeks.

## 2017-09-10 ENCOUNTER — Encounter: Payer: Self-pay | Admitting: Family Medicine

## 2017-09-10 DIAGNOSIS — E1140 Type 2 diabetes mellitus with diabetic neuropathy, unspecified: Secondary | ICD-10-CM | POA: Insufficient documentation

## 2017-09-10 LAB — OPIATE SCREEN, URINE: Opiate Scrn, Ur: POSITIVE ng/mL — AB

## 2017-09-10 LAB — PROTEIN / CREATININE RATIO, URINE
Creatinine, Urine: 92.7 mg/dL
Protein, Ur: 33 mg/dL
Protein/Creat Ratio: 356 mg/g creat — ABNORMAL HIGH (ref 0–200)

## 2017-09-10 NOTE — Assessment & Plan Note (Addendum)
Established problem Controlled without requip.  Gabapentin and Opiates likely helping.  Continue current therapy regiment.

## 2017-09-10 NOTE — Assessment & Plan Note (Signed)
Established problem Controlled Continue current therapy regiment. No red flags 3 Rx printed Norco 5/325 tab #90; Rx fill now, Rx fill 30 days, Rx fill 60 days RTC 3 months

## 2017-09-10 NOTE — Assessment & Plan Note (Signed)
Established problem. Stable. Continue current therapy Warfarin dosed and in anticoagulation tab.  RTC INR 4 weeks.

## 2017-09-10 NOTE — Assessment & Plan Note (Addendum)
Established problem Stable Continue gabapentin current schedule and dose

## 2017-09-10 NOTE — Assessment & Plan Note (Signed)
See Afib problem. See Anticoagulation tab.

## 2017-09-10 NOTE — Progress Notes (Signed)
Subjective:    Patient ID: Abigail Duncan, female    DOB: 07/07/1941, 76 y.o.   MRN: [id number] Abigail Duncan is alone Sources of clinical information for visit is/are patient and past medical records. Nursing assessment for this office visit was reviewed with the patient for accuracy and revision.  Depression screen PHQ 2/9 09/09/2017  Decreased Interest 0  Down, Depressed, Hopeless 0  PHQ - 2 Score 0  Altered sleeping -  Tired, decreased energy -  Change in appetite -  Feeling bad or failure about yourself  -  Trouble concentrating -  Moving slowly or fidgety/restless -  Suicidal thoughts -  PHQ-9 Score -  Some recent data might be hidden   Fall Risk  09/09/2017 05/20/2017 03/19/2017 01/21/2017 12/11/2016  Falls in the past year? No No No No No  Number falls in past yr: - - - - -  Injury with Fall? 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 [facility]) CHRONIC DIABETES  Disease Monitoring  Blood Sugar Ranges: not checking at home  Polyuria: no   Visual problems: no   Medication Compliance: yes  Medication Side Effects  Hypoglycemia: no             Diarrhea: loose stools 3 times a day since going up on metformin to twice a day   [place]             Daily Aspirin: On warfarin             Statin: on Crestor 10 mg daily             Recent eGFR: 49 ml/min (08/18)             ACEI: Enalapril  Eye Exam: not in over 2 years  Foot Exam: uptodate  Diet pattern: fair  Exercise: none above basal activity of impaired mobility     Atrial fibrillation (HCC) - Chronic  - no palpitations nor heart racing. taaking metoprolol - On limb or facial weakness nor numbness, no difficulty with speech.  - Taking Warfarin: Denies melena, hematochezia, gingival bleeding, epistaxis.    Diabetic peripheral neuropathy (HCC) - loss of sensation, not  painful   Relevant Orders   Opiate Screen, Urine (Completed)   Encounter for chronic pain management - review of NCCSRS showed no evidence of doctor shopping.    Relevant Orders   Opiate Screen, Urine (Completed)   Benign hypertensive heart and kidney diseaseand chronic kidney disease stage 3 (HCC) CHRONIC HYPERTENSION  Disease Monitoring  Blood pressure range: not checking at home  Chest pain: no   Dyspnea: no   Claudication: no   Medication compliance: no  Medication Side Effects  Lightheadedness: no   Urinary frequency: yes   Edema: no     Preventitive Healthcare:   Salt Restriction: no      Relevant Orders   Protein / creatinine ratio, urine     Medium   DDD (degenerative disc disease), lumbar  - chronic low back and bilateral klnees with DDD L5 - S1  - Taking Norco 5 / 325 TID - Reports adequate pain relief to be able to perform ADLs and iADLs - Denies constipation, falls, confusion nor cravings. - Denies alcohol and illicit substances  Relevant Medications   HYDROcodone-acetaminophen (NORCO) 5-325 MG tablet   HYDROcodone-acetaminophen (NORCO) 5-325 MG tablet   HYDROcodone-acetaminophen (NORCO/VICODIN) 5-325 MG tablet   Restless leg syndrome   Relevant Orders   Opiate Screen, Urine (Completed)     Low   Osteoarthritis of multiple joints - Primary - Morbid obeisty - see DDD above   Relevant Medications   HYDROcodone-acetaminophen (NORCO) 5-325 MG tablet   HYDROcodone-acetaminophen (NORCO) 5-325 MG tablet   HYDROcodone-acetaminophen (NORCO/VICODIN) 5-325 MG tablet   Other Relevant Orders   Opiate Screen, Urine (Completed)    Other Visit Diagnoses    Screening for breast cancer       Relevant Orders   MM Digital Screening   Screening for diabetic retinopathy       Relevant Orders   Ambulatory referral to Ophthalmology     SH: no smoking   Review of Systems See hpi    Objective:   Physical Exam VS reviewed GEN: Alert, Cooperative,  Groomed, NAD, sitting in WC COR: irr irr, No M/G/R, No JVD, Normal PMI size and location LUNGS: BCTA, No Acc mm use, speaking in full sentences EXT: No peripheral leg edema.SKIN: No lesion nor rashes of face/trunk/extremities Neuro: Oriented to person, place, and time; Psych: Normal affect/thought/speech/language        Assessment & Plan:  See problem list

## 2017-09-10 NOTE — Assessment & Plan Note (Addendum)
Established problem. Stable. Continue current antihypertensive regiment.

## 2017-09-10 NOTE — Assessment & Plan Note (Signed)
Established problem No improvement. Discussed role in OA.  portion reduction goal.

## 2017-09-10 NOTE — Assessment & Plan Note (Addendum)
Lab Results  Component Value Date   HGBA1C 7.6 08/13/2017  Adequate glycemic control. Tolerating medications.  No new end-organ damage.   Given daily loose stools x 3 with increase of metformin 500 mg tab from one daily to one BID, will decrease back to one tab daily and recheck A1c in 3 months. Given Comorbidities, A1c goal is < = 8.0.  DD4 inhibitor Linagliptin (no renal adjustment) or Glipizide may be a choice or once week dulagliptin injection.

## 2017-10-20 ENCOUNTER — Ambulatory Visit: Payer: Medicare Other

## 2017-11-15 ENCOUNTER — Other Ambulatory Visit: Payer: Medicare Other

## 2017-11-17 ENCOUNTER — Ambulatory Visit (INDEPENDENT_AMBULATORY_CARE_PROVIDER_SITE_OTHER): Payer: Medicare Other | Admitting: *Deleted

## 2017-11-17 DIAGNOSIS — Z7901 Long term (current) use of anticoagulants: Secondary | ICD-10-CM

## 2017-11-17 DIAGNOSIS — I4891 Unspecified atrial fibrillation: Secondary | ICD-10-CM | POA: Diagnosis not present

## 2017-11-17 LAB — POCT INR: INR: 2.4

## 2017-12-03 IMAGING — DX DG CHEST 2V
2 series · 2 of 2 positions shown · non-contrast
Comparison: None.

CLINICAL DATA: Shortness of breath for the past 4-5 months.

EXAM:
CHEST  2 VIEW

[dg chest 2 view (1 of 2)]
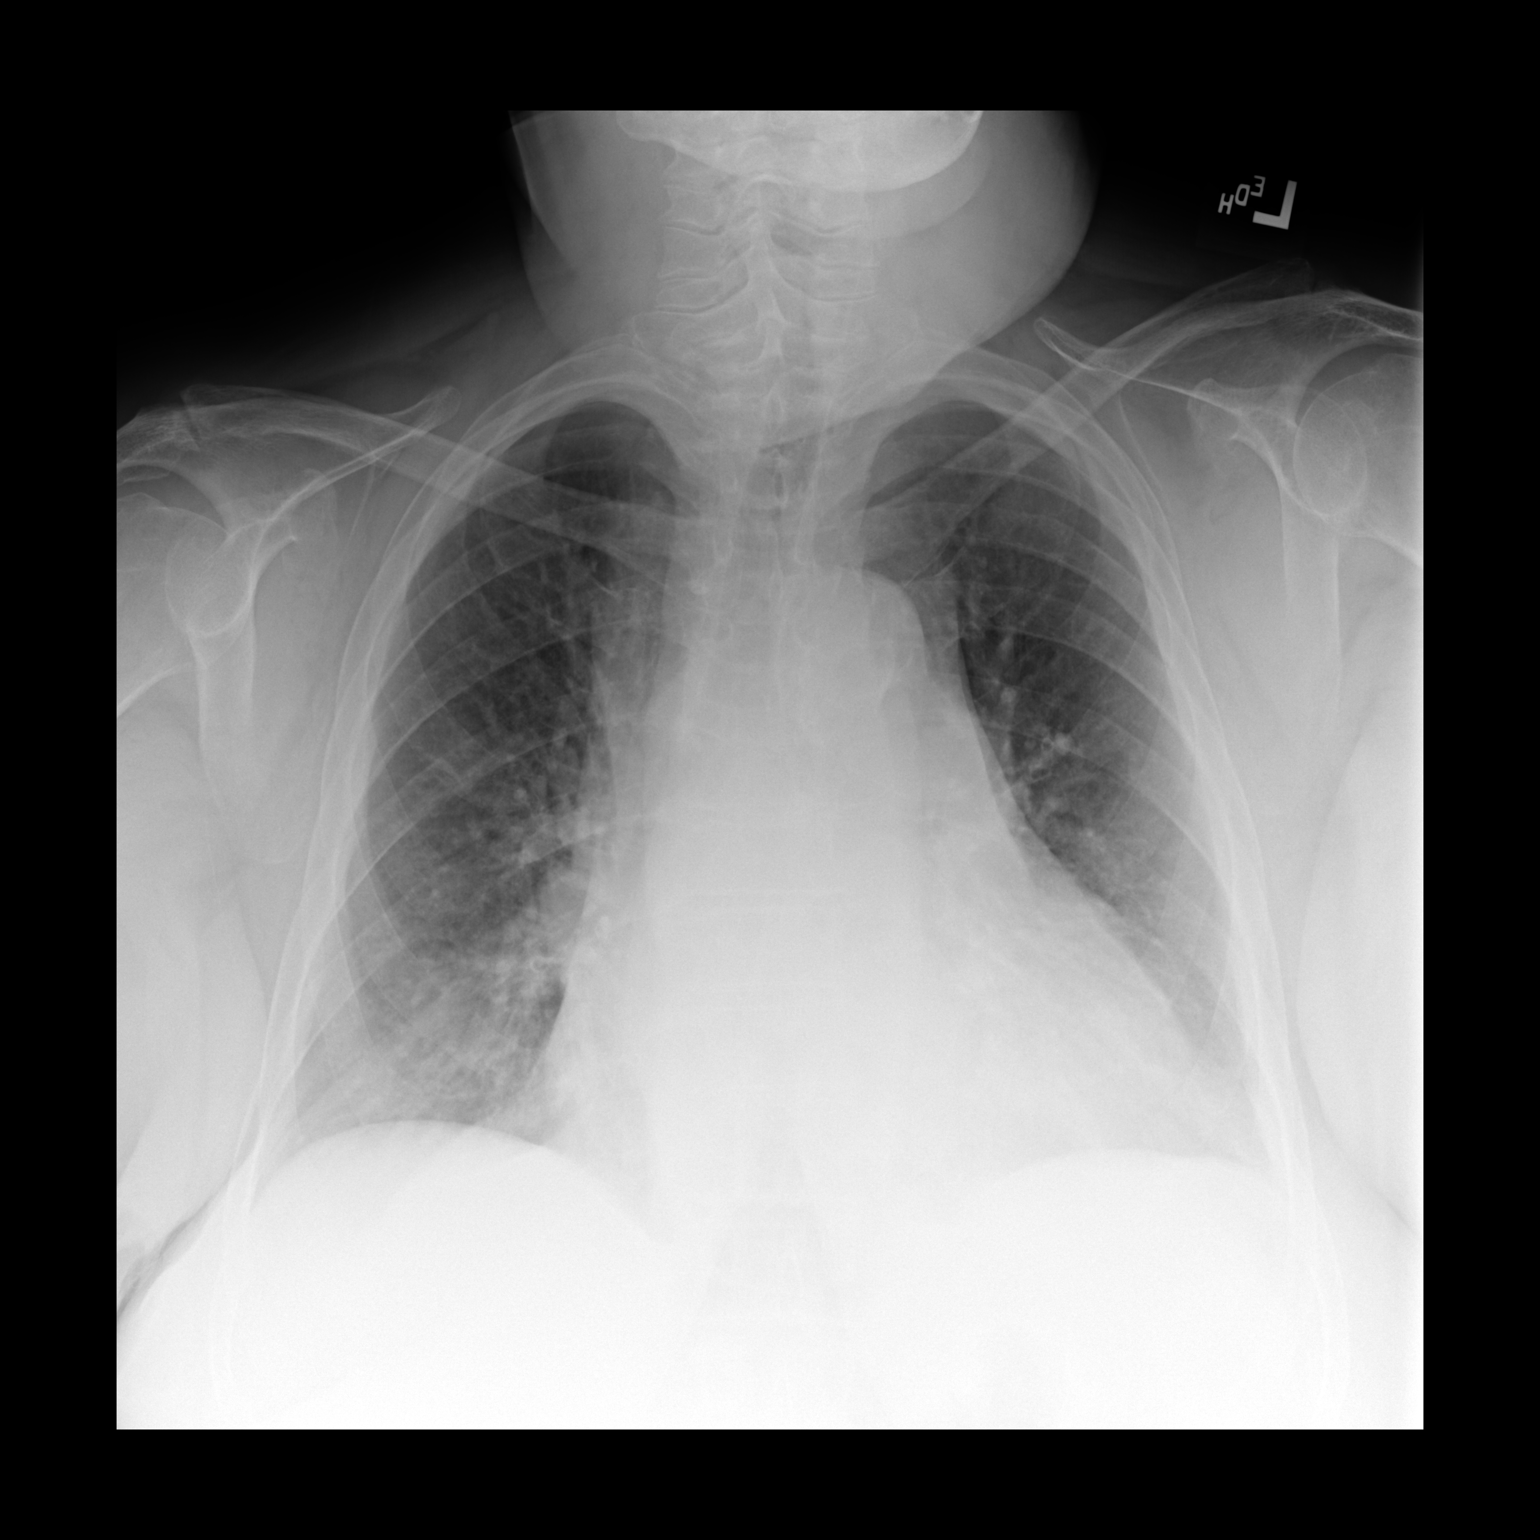

[dg chest 2 view (2 of 2)]
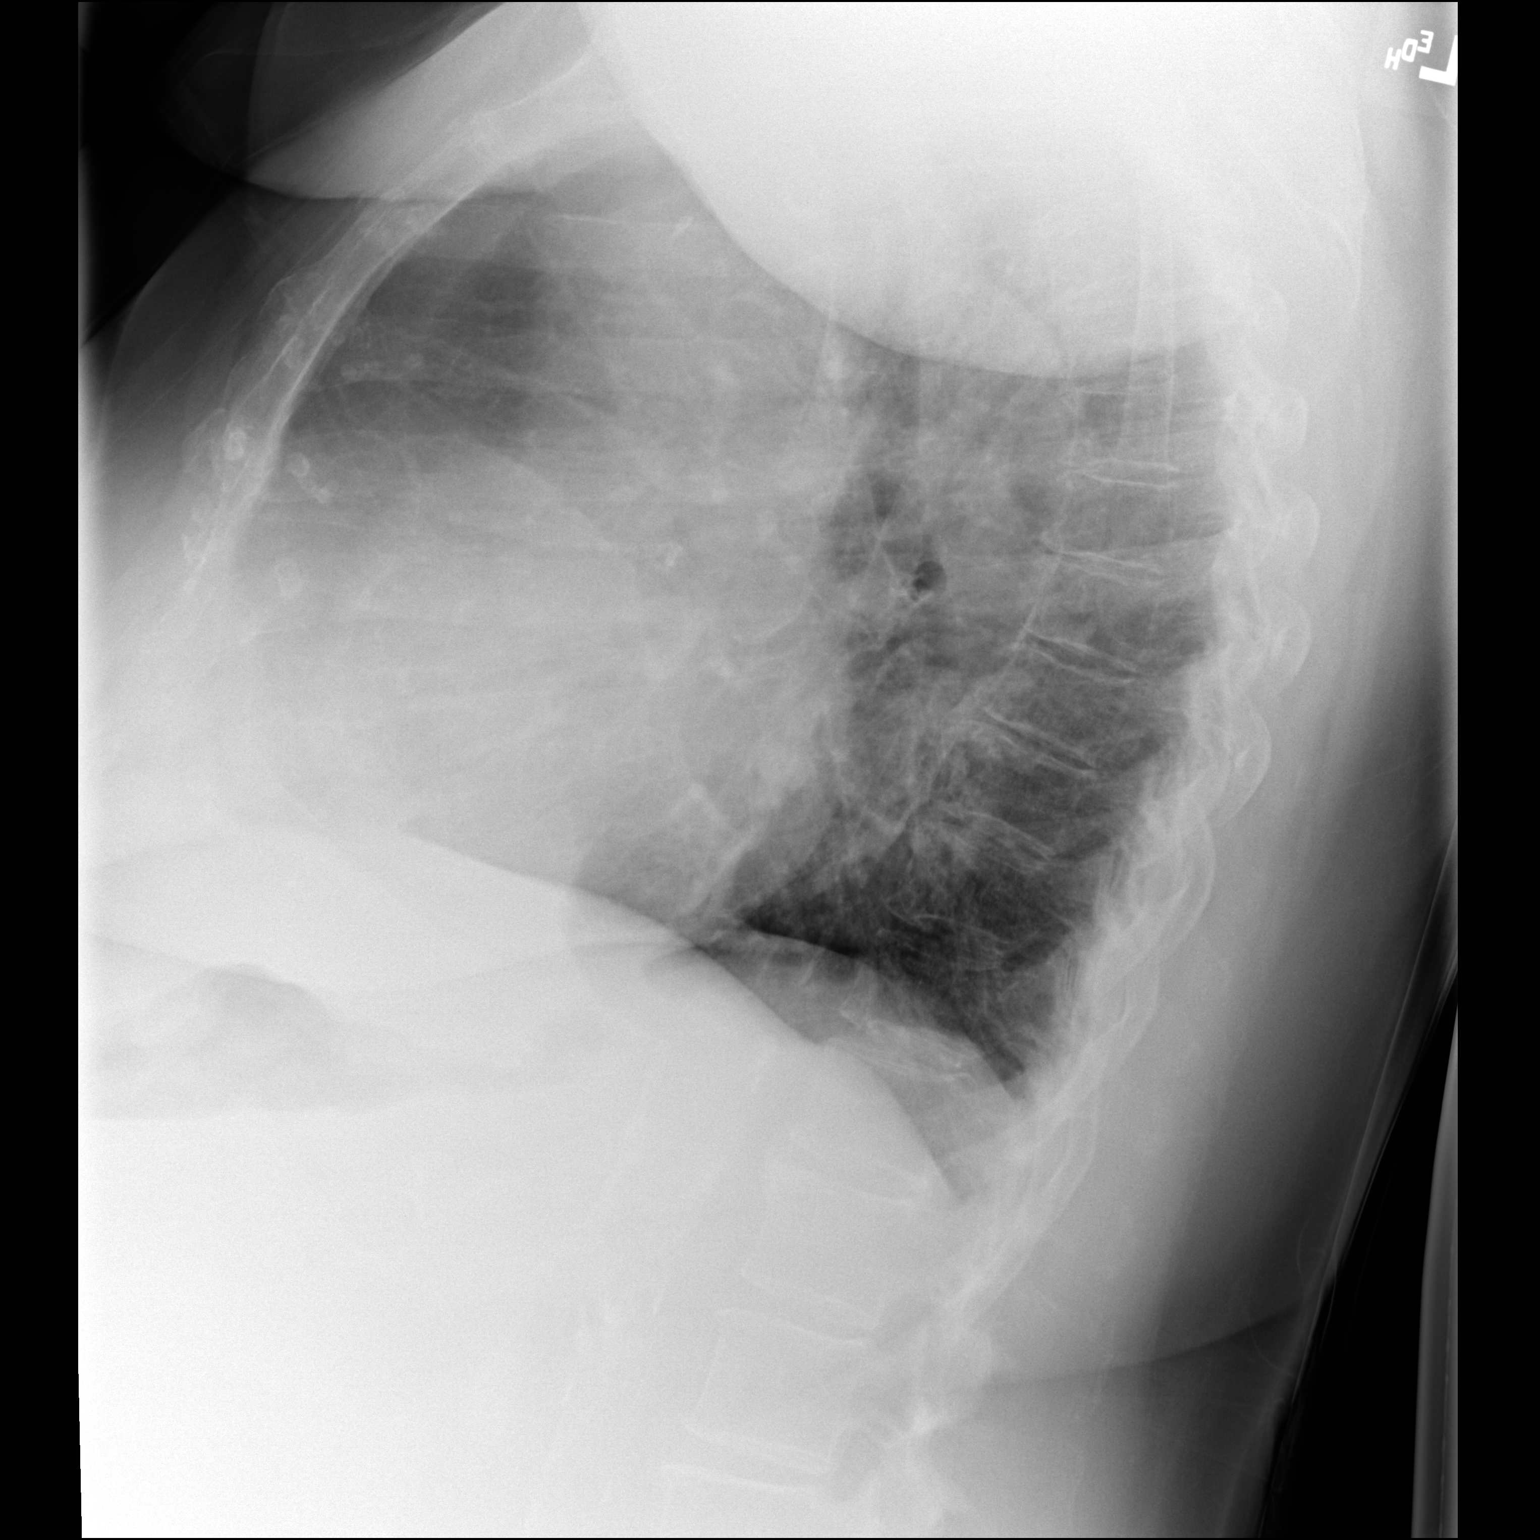

[2 of 2 positions shown; findings below may reference images not displayed]

FINDINGS: Enlarged cardiac silhouette. Mildly tortuous and calcified thoracic
aorta. Prominent pulmonary vasculature. Mild diffuse peribronchial
thickening. Minimal left pleural thickening or fluid. Mild thoracic
and upper lumbar spine degenerative changes.
IMPRESSION: 1. Cardiomegaly and mild pulmonary vascular congestion.
2. Minimal left pleural thickening or fluid.
3. Mild bronchitic changes.

## 2017-12-07 ENCOUNTER — Other Ambulatory Visit: Payer: Self-pay

## 2017-12-07 DIAGNOSIS — E1142 Type 2 diabetes mellitus with diabetic polyneuropathy: Secondary | ICD-10-CM

## 2017-12-07 DIAGNOSIS — H2513 Age-related nuclear cataract, bilateral: Secondary | ICD-10-CM | POA: Diagnosis not present

## 2017-12-07 DIAGNOSIS — Z794 Long term (current) use of insulin: Secondary | ICD-10-CM | POA: Diagnosis not present

## 2017-12-07 DIAGNOSIS — E113293 Type 2 diabetes mellitus with mild nonproliferative diabetic retinopathy without macular edema, bilateral: Secondary | ICD-10-CM | POA: Diagnosis not present

## 2017-12-07 NOTE — Telephone Encounter (Signed)
Pharmacy faxed form stating pt is only taking 2 capsules TID verses 3 capsules TID which is what the script is written for. Please clarify with new script.

## 2017-12-08 MED ORDER — GABAPENTIN 300 MG PO CAPS: 600.0000 mg | ORAL_CAPSULE | Freq: Three times a day (TID) | ORAL | 5 refills | Status: DC

## 2017-12-09 ENCOUNTER — Other Ambulatory Visit: Payer: Self-pay

## 2017-12-09 ENCOUNTER — Encounter: Payer: Self-pay | Admitting: Family Medicine

## 2017-12-09 ENCOUNTER — Ambulatory Visit (INDEPENDENT_AMBULATORY_CARE_PROVIDER_SITE_OTHER): Payer: Medicare Other | Admitting: Family Medicine

## 2017-12-09 VITALS — BP 128/70 | HR 100 | Temp 98.2°F | Ht 63.0 in

## 2017-12-09 DIAGNOSIS — M150 Primary generalized (osteo)arthritis: Secondary | ICD-10-CM | POA: Diagnosis not present

## 2017-12-09 DIAGNOSIS — I129 Hypertensive chronic kidney disease with stage 1 through stage 4 chronic kidney disease, or unspecified chronic kidney disease: Secondary | ICD-10-CM

## 2017-12-09 DIAGNOSIS — G8929 Other chronic pain: Secondary | ICD-10-CM

## 2017-12-09 DIAGNOSIS — I482 Chronic atrial fibrillation: Secondary | ICD-10-CM

## 2017-12-09 DIAGNOSIS — E1149 Type 2 diabetes mellitus with other diabetic neurological complication: Secondary | ICD-10-CM

## 2017-12-09 DIAGNOSIS — Z79899 Other long term (current) drug therapy: Secondary | ICD-10-CM

## 2017-12-09 DIAGNOSIS — E1142 Type 2 diabetes mellitus with diabetic polyneuropathy: Secondary | ICD-10-CM

## 2017-12-09 DIAGNOSIS — M159 Polyosteoarthritis, unspecified: Secondary | ICD-10-CM

## 2017-12-09 DIAGNOSIS — E7800 Pure hypercholesterolemia, unspecified: Secondary | ICD-10-CM

## 2017-12-09 DIAGNOSIS — N183 Chronic kidney disease, stage 3 unspecified: Secondary | ICD-10-CM

## 2017-12-09 DIAGNOSIS — M5136 Other intervertebral disc degeneration, lumbar region: Secondary | ICD-10-CM

## 2017-12-09 DIAGNOSIS — I4821 Permanent atrial fibrillation: Secondary | ICD-10-CM

## 2017-12-09 DIAGNOSIS — E1122 Type 2 diabetes mellitus with diabetic chronic kidney disease: Secondary | ICD-10-CM

## 2017-12-09 DIAGNOSIS — E1140 Type 2 diabetes mellitus with diabetic neuropathy, unspecified: Secondary | ICD-10-CM

## 2017-12-09 DIAGNOSIS — E1322 Other specified diabetes mellitus with diabetic chronic kidney disease: Secondary | ICD-10-CM

## 2017-12-09 DIAGNOSIS — Z7901 Long term (current) use of anticoagulants: Secondary | ICD-10-CM

## 2017-12-09 DIAGNOSIS — I10 Essential (primary) hypertension: Secondary | ICD-10-CM

## 2017-12-09 DIAGNOSIS — M51369 Other intervertebral disc degeneration, lumbar region without mention of lumbar back pain or lower extremity pain: Secondary | ICD-10-CM

## 2017-12-09 LAB — POCT INR: INR: 2.2

## 2017-12-09 LAB — POCT GLYCOSYLATED HEMOGLOBIN (HGB A1C): HEMOGLOBIN A1C: 8.9

## 2017-12-09 MED ORDER — HYDROCODONE-ACETAMINOPHEN 5-325 MG PO TABS: 1.0000 | ORAL_TABLET | Freq: Four times a day (QID) | ORAL | 0 refills | Status: DC | PRN

## 2017-12-09 MED ORDER — GABAPENTIN 300 MG PO CAPS: 600.0000 mg | ORAL_CAPSULE | Freq: Three times a day (TID) | ORAL | 5 refills | Status: DC

## 2017-12-09 NOTE — Patient Instructions (Signed)
Please increase your Metformin to twice a day.  Your A1c was up from November.   Dr McDiarmid will see you back in 3 months to see how well your blood sugars are doing.   Abigail Duncan.

## 2017-12-10 ENCOUNTER — Encounter: Payer: Self-pay | Admitting: Family Medicine

## 2017-12-10 LAB — BASIC METABOLIC PANEL
BUN/Creatinine Ratio: 23 (ref 12–28)
BUN: 34 mg/dL — ABNORMAL HIGH (ref 8–27)
CALCIUM: 9 mg/dL (ref 8.7–10.3)
CHLORIDE: 97 mmol/L (ref 96–106)
CO2: 24 mmol/L (ref 20–29)
Creatinine, Ser: 1.48 mg/dL — ABNORMAL HIGH (ref 0.57–1.00)
GFR calc Af Amer: 40 mL/min/{1.73_m2} — ABNORMAL LOW (ref >59)
GFR, EST NON AFRICAN AMERICAN: 35 mL/min/{1.73_m2} — AB (ref >59)
Glucose: 177 mg/dL — ABNORMAL HIGH (ref 65–99)
POTASSIUM: 4.7 mmol/L (ref 3.5–5.2)
Sodium: 141 mmol/L (ref 134–144)

## 2017-12-10 LAB — LDL CHOLESTEROL, DIRECT: LDL DIRECT: 79 mg/dL (ref 0–99)

## 2017-12-10 NOTE — Assessment & Plan Note (Signed)
Vitals:   12/09/17 1038  BP: 128/70  Pulse: 100  Temp: 98.2 F (36.8 C)  SpO2: 95%  Goal less than 130/80 given proteinuric CKD  adeqaute control. Continue current therapy

## 2017-12-10 NOTE — Assessment & Plan Note (Addendum)
Established problem (+) proteinuria ==> by spot urine protein: creatinine ration its is ~ 0.4 g / day proteinuria

## 2017-12-10 NOTE — Assessment & Plan Note (Signed)
Established problem Controlled Continue current therapy regiment. No red flags on review of [place] CSRS 3 Rx sent online Norco 5/325 tab #90; Rx fill now, Rx fill 30 days, Rx fill 60 days RTC 3 months

## 2017-12-10 NOTE — Progress Notes (Signed)
Subjective:    Patient ID: Abigail Duncan, female    DOB: 1941/07/22, 77 y.o.   MRN: [id number] Abigail Duncan is alone Sources of clinical information for visit is/are patient and past medical records. Nursing assessment for this office visit was reviewed with the patient for accuracy and revision.  Depression screen PHQ 2/9 12/09/2017  Decreased Interest 0  Down, Depressed, Hopeless 0  PHQ - 2 Score 0  Altered sleeping -  Tired, decreased energy -  Change in appetite -  Feeling bad or failure about yourself  -  Trouble concentrating -  Moving slowly or fidgety/restless -  Suicidal thoughts -  PHQ-9 Score -  Some recent data might be hidden   Fall Risk  12/09/2017 09/09/2017 05/20/2017 03/19/2017 01/21/2017  Falls in the past year? _0   Number falls in past yr: - - - - -  Injury with Fall? 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 [facility]) CHRONIC DIABETES  Disease Monitoring  Blood Sugar Ranges: not checking at home  Polyuria: no   Visual problems: no   Medication Compliance: yes , metformin 500 mg daily Medication Side Effects  Hypoglycemia: no             Diarrhea: loose stools 3 times a day since going up on metformin to twice a day   [place]             Daily Aspirin: On warfarin             Statin: on Crestor 10 mg daily             Recent eGFR: 49 ml/min (08/18)             ACEI: Enalapril  Eye Exam: not in over 2 years  Foot Exam: uptodate  Diet pattern: fair  Exercise: none above basal activity of impaired mobility     Atrial fibrillation (HCC) - Chronic  - no palpitations nor heart racing. taaking metoprolol - On limb or facial weakness nor numbness, no difficulty with speech.  - Taking Warfarin: Denies melena, hematochezia, gingival bleeding, epistaxis.    Diabetic peripheral neuropathy (HCC) -  loss of sensation, not painful   Relevant Orders   Opiate Screen, Urine (Completed)   Encounter for chronic pain management - review of NCCSRS showed no evidence of doctor shopping.    Relevant Orders   Opiate Screen, Urine (Completed)   Benign hypertensive heart and kidney diseaseand chronic kidney disease stage 3 (HCC) CHRONIC HYPERTENSION  Disease Monitoring  Blood pressure range: not checking at home  Chest pain: no   Dyspnea: no   Claudication: no   Medication compliance: no  Medication Side Effects  Lightheadedness: no   Urinary frequency: yes   Edema: no     Preventitive Healthcare:   Salt Restriction: no      Relevant Orders   Protein / creatinine ratio, urine     Medium   DDD (degenerative disc disease), lumbar  - chronic low back and bilateral klnees with DDD L5 - S1  - Taking Norco 5 / 325 TID - Reports adequate pain relief to be able to perform ADLs and iADLs - Denies constipation, falls, confusion nor cravings. - Denies alcohol and illicit  substances    Relevant Medications   HYDROcodone-acetaminophen (NORCO) 5-325 MG tablet   HYDROcodone-acetaminophen (NORCO) 5-325 MG tablet   HYDROcodone-acetaminophen (NORCO/VICODIN) 5-325 MG tablet   Restless leg syndrome   Relevant Orders   Opiate Screen, Urine (Completed)     Low   Osteoarthritis of multiple joints - Primary - Morbid obeisty - see DDD above   Relevant Medications   HYDROcodone-acetaminophen (NORCO) 5-325 MG tablet   HYDROcodone-acetaminophen (NORCO) 5-325 MG tablet   HYDROcodone-acetaminophen (NORCO/VICODIN) 5-325 MG tablet  Hypercholesterolemia No chest pain No weaknees or numbness in limbs or face.   Taking crestor daily No difficulty with speech events No RUQ pain No jaundice.     SH: no smoking   Review of Systems See hpi    Objective:   Physical Exam VS reviewed GEN: Alert, Cooperative, Groomed, NAD, sitting in WC COR: irr irr, No M/G/R, No JVD, Normal PMI size and  location LUNGS: BCTA, No Acc mm use, speaking in full sentences EXT: No peripheral leg edema.SKIN: No lesion nor rashes of face/trunk/extremities Neuro: Oriented to person, place, and time; Psych: Normal affect/thought/speech/language        Assessment & Plan:  See problem list

## 2017-12-10 NOTE — Assessment & Plan Note (Addendum)
Established problem worsened.  Lab Results  Component Value Date   HGBA1C 8.9 12/09/2017  Was 7.6% 08/10/17. Increase food intake, no excersie, stress from upcoming cataract surgeries atarting 4/1.  Has been compliant with once daily metformin 500mg .  Probably not sufficient dose to make impact on glycemic control,  No complaints that would indicate infections  Pt willing to increase to Metformin 500 mg BID.  Will try to get her to at least 1500 daily, if I can.  Given her hx of PCI with stent, GLP-1R inhibitor would be a good choice like dulaglutide once a week injection. Trulicity is prefferred on her formulary pt d

## 2017-12-10 NOTE — Assessment & Plan Note (Signed)
Established problem Tolerating crestor Checking ldl-direct today

## 2017-12-10 NOTE — Assessment & Plan Note (Addendum)
Established problem. Stable. Continue current therapy as INR in therapeutic range.  Adequte rate control. We discussed change to A[ixaban DOAC (GRF between 36 - 55 depending on gfr formula) She would be interested in switching to apixaban, but wants to wait until after eye surgeries in April.

## 2017-12-10 NOTE — Assessment & Plan Note (Signed)
Established problem Controlled Continue current therapy regiment. No red flags on review of [place] CSRS 3 Rx sent online Norco 5/325 tab #90; Rx fill now, Rx fill 30 days, Rx fill 60 days RTC 3 months

## 2017-12-10 NOTE — Assessment & Plan Note (Signed)
Established problem Controlled Continue current therapy regiment.  

## 2017-12-30 ENCOUNTER — Ambulatory Visit: Payer: Medicare Other | Admitting: Family Medicine

## 2018-01-03 DIAGNOSIS — H2511 Age-related nuclear cataract, right eye: Secondary | ICD-10-CM | POA: Diagnosis not present

## 2018-01-03 DIAGNOSIS — H25811 Combined forms of age-related cataract, right eye: Secondary | ICD-10-CM | POA: Diagnosis not present

## 2018-01-03 DIAGNOSIS — Z01818 Encounter for other preprocedural examination: Secondary | ICD-10-CM | POA: Diagnosis not present

## 2018-01-06 ENCOUNTER — Other Ambulatory Visit: Payer: Self-pay | Admitting: Interventional Cardiology

## 2018-01-13 LAB — HM DIABETES EYE EXAM

## 2018-01-22 ENCOUNTER — Other Ambulatory Visit: Payer: Self-pay | Admitting: Interventional Cardiology

## 2018-01-28 ENCOUNTER — Telehealth: Payer: Self-pay

## 2018-01-28 NOTE — Telephone Encounter (Signed)
Pt requesting rx for flonase for her allergy symptoms. [facility] [address] Her call back [phone] Wallace Cullens, RN

## 2018-01-29 ENCOUNTER — Other Ambulatory Visit: Payer: Self-pay | Admitting: Cardiology

## 2018-01-31 ENCOUNTER — Ambulatory Visit: Payer: Medicare Other | Admitting: Interventional Cardiology

## 2018-01-31 ENCOUNTER — Other Ambulatory Visit: Payer: Self-pay | Admitting: Interventional Cardiology

## 2018-01-31 DIAGNOSIS — H2512 Age-related nuclear cataract, left eye: Secondary | ICD-10-CM | POA: Diagnosis not present

## 2018-01-31 DIAGNOSIS — H25812 Combined forms of age-related cataract, left eye: Secondary | ICD-10-CM | POA: Diagnosis not present

## 2018-01-31 MED ORDER — FLUTICASONE PROPIONATE 50 MCG/ACT NA SUSP: 2.0000 | Freq: Every day | NASAL | 6 refills | Status: DC

## 2018-01-31 NOTE — Telephone Encounter (Signed)
Rx sent in  [facility]

## 2018-02-06 ENCOUNTER — Other Ambulatory Visit: Payer: Self-pay | Admitting: Family Medicine

## 2018-02-15 NOTE — Progress Notes (Deleted)
Cardiology Office Note   Date:  02/15/2018   ID:  Abigail Duncan, DOB July 25, 1941, MRN [id number]  PCP:  McDiarmid, Blane Ohara, MD    No chief complaint on file.    Wt Readings from Last 3 Encounters:  09/09/17 285 lb (129.3 kg)  03/19/17 281 lb 6.4 oz (127.6 kg)  03/18/17 287 lb 1.9 oz (130.2 kg)       History of Present Illness: Abigail Duncan is a 77 y.o. female   Who has had atrial fibrillation for several years.Last echocardiogram showed normal left ventricular function with a dilated right ventricle and right atrium. There was evidence of pulmonary hypertension.   She walks with the assistance of a cane.  She continues to have hip and back pain which is limiting.   Normal stress test in 2018.Marland Kitchen  Past Medical History:  Diagnosis Date  . Degenerative arthritis of knee 04/2001   by xray  . Diabetic peripheral neuropathy ([place]) 10/23/2013  . Impaired mobility 05/21/2017  . Kidney stone on right side 05/12/2016   [place] 05/2016 - non-obstructing right 6 mm stone  . Right knee pain 07/23/2014  . Sciatica of left side 12/2003   DDD L5,S1   . Secondary hyperparathyroidism ([place]) 05/31/2017  . Spinal stenosis, lumbar 04/2004   mild on MRI at L 4-5    Past Surgical History:  Procedure Laterality Date  . benign breast tumor     Excised 40+ years ago  . COLPOSCOPY  03/1991   dysplasia  . PTCA (aka ANGIOPLASTY)  07/1993   RCA     Current Outpatient Medications  Medication Sig Dispense Refill  . calcium-vitamin D (OSCAL WITH D) 500-200 MG-UNIT per tablet Take 2 tablets by mouth daily with breakfast. 180 tablet 1  . enalapril (VASOTEC) 10 MG tablet Take 1 tablet (10 mg total) by mouth daily. 90 tablet 0  . fluticasone (FLONASE) 50 MCG/ACT nasal spray Place 2 sprays into both nostrils daily. 16 g 6  . furosemide (LASIX) 40 MG tablet TAKE 1 TABLET BY MOUTH TWICE A DAY 180 tablet 0  . gabapentin (NEURONTIN) 300 MG capsule Take 2 capsules (600 mg total) by mouth 3 (three) times daily.  180 capsule 5  . HYDROcodone-acetaminophen (NORCO) 5-325 MG tablet Take 1 tablet by mouth every 6 (six) hours as needed for moderate pain. 90 tablet 0  . HYDROcodone-acetaminophen (NORCO) 5-325 MG tablet Take 1 tablet by mouth every 6 (six) hours as needed for moderate pain (Chronic pain). 90 tablet 0  . HYDROcodone-acetaminophen (NORCO/VICODIN) 5-325 MG tablet Take 1 tablet by mouth every 6 (six) hours as needed for moderate pain. 90 tablet 0  . metFORMIN (GLUCOPHAGE) 500 MG tablet Take 1 tablet (500 mg total) by mouth daily with breakfast. 90 tablet 3  . metolazone (ZAROXOLYN) 2.5 MG tablet TAKE 1 TABLET (2.5 MG TOTAL) BY MOUTH EVERY MONDAY, WEDNESDAY, AND FRIDAY. 36 tablet 0  . metoprolol tartrate (LOPRESSOR) 100 MG tablet TAKE 1 TABLET (100 MG TOTAL) 2 (TWO) TIMES DAILY BY MOUTH. 180 tablet 1  . nitroGLYCERIN (NITROSTAT) 0.4 MG SL tablet PLACE 1 TABLET (0.4 MG TOTAL) UNDER THE TONGUE EVERY 5 (FIVE) MINUTES AS NEEDED FOR CHEST PAIN. 25 tablet 1  . potassium chloride SA (KLOR-CON M20) 20 MEQ tablet Take 1 tablet (20 mEq total) by mouth daily. 90 tablet 0  . rosuvastatin (CRESTOR) 20 MG tablet Take 0.5 tablets (10 mg total) by mouth daily. 45 tablet PRN  . warfarin (COUMADIN) 5 MG  tablet TAKE AS DIRECTED 90 tablet 0   No current facility-administered medications for this visit.     Allergies:   Lisinopril    Social History:  The patient  reports that she has never smoked. She has never used smokeless tobacco. She reports that she does not drink alcohol or use drugs.   Family History:  The patient's ***family history includes Cancer in her mother and sister; Cancer (age of onset: 17) in her father; Dementia in her mother; Diabetes in her brother; Heart attack in her sister; Heart disease in her brother; Stroke in her mother.    ROS:  Please see the history of present illness.   Otherwise, review of systems are positive for ***.   All other systems are reviewed and negative.    PHYSICAL  EXAM: VS:  LMP  (LMP Unknown)  , BMI There is no height or weight on file to calculate BMI. GEN: Well nourished, well developed, in no acute distress  HEENT: normal  Neck: no JVD, carotid bruits, or masses Cardiac: ***RRR; no murmurs, rubs, or gallops,no edema  Respiratory:  clear to auscultation bilaterally, normal work of breathing GI: soft, nontender, nondistended, + BS MS: no deformity or atrophy  Skin: warm and dry, no rash Neuro:  Strength and sensation are intact Psych: euthymic mood, full affect   EKG:   The ekg ordered today demonstrates ***   Recent Labs: 02/22/2017: TSH 3.130 03/18/2017: NT-Pro BNP 1,382 05/21/2017: Hemoglobin 11.4; Platelets 318 12/09/2017: BUN 34; Creatinine, Ser 1.48; Potassium 4.7; Sodium 141   Lipid Panel    Component Value Date/Time   CHOL 217 (H) 03/20/2016 1214   TRIG 445 (H) 03/20/2016 1214   HDL 42 (L) 03/20/2016 1214   CHOLHDL 5.2 (H) 03/20/2016 1214   VLDL NOT CALC 03/20/2016 1214   LDLCALC NOT CALC 03/20/2016 1214   LDLDIRECT 79 12/09/2017 1353   LDLDIRECT 59 09/03/2009 2024     Other studies Reviewed: Additional studies/ records that were reviewed today with results demonstrating: Normal stress test in 2018..   ASSESSMENT AND PLAN:  1. AFib:  2. SHOB: 3. Hyperlipidemia:     Current medicines are reviewed at length with the patient today.  The patient concerns regarding her medicines were addressed.  The following changes have been made:  No change***  Labs/ tests ordered today include: *** No orders of the defined types were placed in this encounter.   Recommend 150 minutes/week of aerobic exercise Low fat, low carb, high fiber diet recommended  Disposition:   FU in ***   Signed, Larae Grooms, MD  02/15/2018 5:47 PM    [facility] [place], [place], [place]  [address] Phone: [phone]; Fax: [phone]

## 2018-02-17 ENCOUNTER — Ambulatory Visit: Payer: Medicare Other | Admitting: Interventional Cardiology

## 2018-02-21 NOTE — Progress Notes (Signed)
Cardiology Office Note   Date:  02/24/2018   ID:  Abigail Duncan, DOB 1940/12/14, MRN [id number]  PCP:  McDiarmid, Blane Ohara, MD    No chief complaint on file.  AFib  Wt Readings from Last 3 Encounters:  02/24/18 284 lb (128.8 kg)  09/09/17 285 lb (129.3 kg)  03/19/17 281 lb 6.4 oz (127.6 kg)       History of Present Illness: Abigail Duncan is a 77 y.o. female  Who has had atrial fibrillation for several years. She recently underwent an echocardiogram. This showed normal left ventricular function with a dilated right ventricle and right atrium. There was evidence of pulmonary hypertension.   She denies any chronic lung problems. She does state that she does not sleep well. She snores heavily. She does feel tired in the mornings  after sleeping and can fall sleep easily when she does not mean to.   She walks with the assistance of a cane.  She continues to have hip and back pain which is limiting.  She does not do much walking.  She eats corn and potatoes.  She drinks some sugary drinks at times.  She has gained weight since the last visit.   Since last visit, she continues to drink lemonade on occasion.  She still has potatoes and frequently has white bread.   Denies : Chest pain. Dizziness. Leg edema. Nitroglycerin use. Orthopnea. Palpitations. Paroxysmal nocturnal dyspnea. Syncope.   Still reports occasional SHOB.  Past Medical History:  Diagnosis Date  . Degenerative arthritis of knee 04/2001   by xray  . Diabetic peripheral neuropathy ([place]) 10/23/2013  . Impaired mobility 05/21/2017  . Kidney stone on right side 05/12/2016   [place] 05/2016 - non-obstructing right 6 mm stone  . Right knee pain 07/23/2014  . Sciatica of left side 12/2003   DDD L5,S1   . Secondary hyperparathyroidism ([place]) 05/31/2017  . Spinal stenosis, lumbar 04/2004   mild on MRI at L 4-5    Past Surgical History:  Procedure Laterality Date  . benign breast tumor     Excised 40+ years ago  . COLPOSCOPY   03/1991   dysplasia  . PTCA (aka ANGIOPLASTY)  07/1993   RCA     Current Outpatient Medications  Medication Sig Dispense Refill  . calcium-vitamin D (OSCAL WITH D) 500-200 MG-UNIT per tablet Take 2 tablets by mouth daily with breakfast. 180 tablet 1  . enalapril (VASOTEC) 10 MG tablet Take 1 tablet (10 mg total) by mouth daily. 90 tablet 0  . fluticasone (FLONASE) 50 MCG/ACT nasal spray Place 2 sprays into both nostrils daily. 16 g 6  . furosemide (LASIX) 40 MG tablet TAKE 1 TABLET BY MOUTH TWICE A DAY 180 tablet 0  . gabapentin (NEURONTIN) 300 MG capsule Take 2 capsules (600 mg total) by mouth 3 (three) times daily. 180 capsule 5  . HYDROcodone-acetaminophen (NORCO/VICODIN) 5-325 MG tablet Take 1 tablet by mouth every 6 (six) hours as needed for moderate pain. 90 tablet 0  . metFORMIN (GLUCOPHAGE) 500 MG tablet Take 1 tablet (500 mg total) by mouth daily with breakfast. 90 tablet 3  . metolazone (ZAROXOLYN) 2.5 MG tablet TAKE 1 TABLET (2.5 MG TOTAL) BY MOUTH EVERY MONDAY, WEDNESDAY, AND FRIDAY. 36 tablet 0  . metoprolol tartrate (LOPRESSOR) 100 MG tablet TAKE 1 TABLET (100 MG TOTAL) 2 (TWO) TIMES DAILY BY MOUTH. 180 tablet 1  . nitroGLYCERIN (NITROSTAT) 0.4 MG SL tablet PLACE 1 TABLET (0.4 MG TOTAL) UNDER  THE TONGUE EVERY 5 (FIVE) MINUTES AS NEEDED FOR CHEST PAIN. 25 tablet 1  . potassium chloride SA (KLOR-CON M20) 20 MEQ tablet Take 1 tablet (20 mEq total) by mouth daily. 90 tablet 0  . rosuvastatin (CRESTOR) 20 MG tablet Take 0.5 tablets (10 mg total) by mouth daily. 45 tablet PRN  . Vitamin D, Ergocalciferol, (DRISDOL) 50000 units CAPS capsule Take 50,000 Units by mouth once a week.  0  . warfarin (COUMADIN) 5 MG tablet TAKE AS DIRECTED 90 tablet 0   No current facility-administered medications for this visit.     Allergies:   Lisinopril    Social History:  The patient  reports that she has never smoked. She has never used smokeless tobacco. She reports that she does not drink  alcohol or use drugs.   Family History:  The patient's family history includes Cancer in her mother and sister; Cancer (age of onset: 36) in her father; Dementia in her mother; Diabetes in her brother; Heart attack in her sister; Heart disease in her brother; Stroke in her mother.    ROS:  Please see the history of present illness.   Otherwise, review of systems are positive for difficulty losing weight.   All other systems are reviewed and negative.    PHYSICAL EXAM: VS:  BP 114/72   Pulse 83   Ht 5\' 3"  (1.6 m)   Wt 284 lb (128.8 kg)   LMP  (LMP Unknown)   SpO2 96%   BMI 50.31 kg/m  , BMI Body mass index is 50.31 kg/m. GEN: Well nourished, well developed, in no acute distress  HEENT: normal  Neck: no JVD, carotid bruits, or masses Cardiac: irregularly irregular; no murmurs, rubs, or gallops,; tr LE edema  Respiratory:  clear to auscultation bilaterally, normal work of breathing GI: soft, nontender, nondistended, + BS, obese MS: no deformity or atrophy  Skin: warm and dry, no rash Neuro:  Strength and sensation are intact Psych: euthymic mood, full affect   EKG:   The ekg ordered today demonstrates AFib, rate controlled   Recent Labs: 03/18/2017: NT-Pro BNP 1,382 05/21/2017: Hemoglobin 11.4; Platelets 318 12/09/2017: BUN 34; Creatinine, Ser 1.48; Potassium 4.7; Sodium 141   Lipid Panel    Component Value Date/Time   CHOL 217 (H) 03/20/2016 1214   TRIG 445 (H) 03/20/2016 1214   HDL 42 (L) 03/20/2016 1214   CHOLHDL 5.2 (H) 03/20/2016 1214   VLDL NOT CALC 03/20/2016 1214   LDLCALC NOT CALC 03/20/2016 1214   LDLDIRECT 79 12/09/2017 1353   LDLDIRECT 59 09/03/2009 2024     Other studies Reviewed: Additional studies/ records that were reviewed today with results demonstrating: LDL 79 in 3/19 .   ASSESSMENT AND PLAN:  1. PAF: Coumadin for stroke prevention.  Strategy of rate control given her lack of symptoms. 2. SHOB: Likely multifactorial.  Is on multiple diuretics to  treat for volume overload.  Need to follow for hyponatremia.  Morbid obesity also contributing.  She will try to decrease carb intake to lose weight. 3. OSA: She reports passing a sleep study in 2016.   4. HTN: Well controlled.  Continue current meds. 5. Pulmonary HTN:  MIld PAH on prior echo.  Morbid obesity also contributing. 6. Black stool.  : Would have PMD do a home stool test for blood.  WIll check CBC today.   Current medicines are reviewed at length with the patient today.  The patient concerns regarding her medicines were addressed.  The following  changes have been made:  No change  Labs/ tests ordered today include: CBC No orders of the defined types were placed in this encounter.   Recommend 150 minutes/week of aerobic exercise Low fat, low carb, high fiber diet recommended  Disposition:   FU in 1 year   Signed, Larae Grooms, MD  02/24/2018 9:32 AM    [facility] [address], [place], [place]  [address] Phone: [phone]; Fax: [phone]

## 2018-02-24 ENCOUNTER — Ambulatory Visit: Payer: Medicare Other | Admitting: Interventional Cardiology

## 2018-02-24 ENCOUNTER — Other Ambulatory Visit: Payer: Self-pay | Admitting: Interventional Cardiology

## 2018-02-24 ENCOUNTER — Encounter (INDEPENDENT_AMBULATORY_CARE_PROVIDER_SITE_OTHER): Payer: Self-pay

## 2018-02-24 ENCOUNTER — Encounter: Payer: Self-pay | Admitting: Interventional Cardiology

## 2018-02-24 VITALS — BP 114/72 | HR 83 | Ht 63.0 in | Wt 284.0 lb

## 2018-02-24 DIAGNOSIS — G4733 Obstructive sleep apnea (adult) (pediatric): Secondary | ICD-10-CM | POA: Diagnosis not present

## 2018-02-24 DIAGNOSIS — I272 Pulmonary hypertension, unspecified: Secondary | ICD-10-CM | POA: Diagnosis not present

## 2018-02-24 DIAGNOSIS — N2581 Secondary hyperparathyroidism of renal origin: Secondary | ICD-10-CM | POA: Diagnosis not present

## 2018-02-24 DIAGNOSIS — R0602 Shortness of breath: Secondary | ICD-10-CM | POA: Diagnosis not present

## 2018-02-24 DIAGNOSIS — N183 Chronic kidney disease, stage 3 (moderate): Secondary | ICD-10-CM | POA: Diagnosis not present

## 2018-02-24 DIAGNOSIS — I10 Essential (primary) hypertension: Secondary | ICD-10-CM | POA: Diagnosis not present

## 2018-02-24 DIAGNOSIS — I480 Paroxysmal atrial fibrillation: Secondary | ICD-10-CM | POA: Diagnosis not present

## 2018-02-24 DIAGNOSIS — K921 Melena: Secondary | ICD-10-CM | POA: Diagnosis not present

## 2018-02-24 DIAGNOSIS — N189 Chronic kidney disease, unspecified: Secondary | ICD-10-CM | POA: Diagnosis not present

## 2018-02-24 LAB — CBC AND DIFFERENTIAL
HEMATOCRIT: 35 — AB (ref 36–46)
HEMOGLOBIN: 11 — AB (ref 12.0–16.0)
PLATELETS: 361 (ref 150–399)
WBC: 10.4

## 2018-02-24 LAB — CBC WITH DIFFERENTIAL/PLATELET
BASOS: 0 %
Basophils Absolute: 0 10*3/uL (ref 0.0–0.2)
EOS (ABSOLUTE): 0.2 10*3/uL (ref 0.0–0.4)
EOS: 2 %
HEMATOCRIT: 33.3 % — AB (ref 34.0–46.6)
Hemoglobin: 10.8 g/dL — ABNORMAL LOW (ref 11.1–15.9)
IMMATURE GRANULOCYTES: 1 %
Immature Grans (Abs): 0.1 10*3/uL (ref 0.0–0.1)
LYMPHS: 21 %
Lymphocytes Absolute: 2 10*3/uL (ref 0.7–3.1)
MCH: 28.1 pg (ref 26.6–33.0)
MCHC: 32.4 g/dL (ref 31.5–35.7)
MCV: 87 fL (ref 79–97)
Monocytes Absolute: 0.7 10*3/uL (ref 0.1–0.9)
Monocytes: 7 %
NEUTROS PCT: 69 %
Neutrophils Absolute: 6.6 10*3/uL (ref 1.4–7.0)
PLATELETS: 362 10*3/uL (ref 150–450)
RBC: 3.85 x10E6/uL (ref 3.77–5.28)
RDW: 16 % — ABNORMAL HIGH (ref 12.3–15.4)
WBC: 9.5 10*3/uL (ref 3.4–10.8)

## 2018-02-24 LAB — BASIC METABOLIC PANEL
BUN: 30 — AB (ref 4–21)
Creatinine: 1.6 — AB (ref 0.5–1.1)
Glucose: 197
Potassium: 4.4 (ref 3.4–5.3)
Sodium: 134 — AB (ref 137–147)

## 2018-02-24 LAB — VITAMIN D 25 HYDROXY (VIT D DEFICIENCY, FRACTURES): Vit D, 25-Hydroxy: 42

## 2018-02-24 NOTE — Patient Instructions (Signed)
Medication Instructions:  Your physician recommends that you continue on your current medications as directed. Please refer to the Current Medication list given to you today.   Labwork: A prescription has been given for you to have a CBC drawn with your other labs today  Testing/Procedures: None ordered  Follow-Up: Your physician wants you to follow-up in: 1 year with Dr. Irish Lack. You will receive a reminder letter in the mail two months in advance. If you don't receive a letter, please call our office to schedule the follow-up appointment.   Any Other Special Instructions Will Be Listed Below (If Applicable).     If you need a refill on your cardiac medications before your next appointment, please call your pharmacy.

## 2018-03-02 DIAGNOSIS — D631 Anemia in chronic kidney disease: Secondary | ICD-10-CM | POA: Diagnosis not present

## 2018-03-02 DIAGNOSIS — I129 Hypertensive chronic kidney disease with stage 1 through stage 4 chronic kidney disease, or unspecified chronic kidney disease: Secondary | ICD-10-CM | POA: Diagnosis not present

## 2018-03-02 DIAGNOSIS — N183 Chronic kidney disease, stage 3 (moderate): Secondary | ICD-10-CM | POA: Diagnosis not present

## 2018-03-02 DIAGNOSIS — N2581 Secondary hyperparathyroidism of renal origin: Secondary | ICD-10-CM | POA: Diagnosis not present

## 2018-03-03 ENCOUNTER — Telehealth: Payer: Self-pay

## 2018-03-03 ENCOUNTER — Encounter: Payer: Self-pay | Admitting: Family Medicine

## 2018-03-03 ENCOUNTER — Other Ambulatory Visit: Payer: Self-pay

## 2018-03-03 ENCOUNTER — Ambulatory Visit (INDEPENDENT_AMBULATORY_CARE_PROVIDER_SITE_OTHER): Payer: Medicare Other | Admitting: Family Medicine

## 2018-03-03 VITALS — BP 132/68 | HR 72 | Temp 98.9°F | Ht 63.0 in | Wt 281.0 lb

## 2018-03-03 DIAGNOSIS — I482 Chronic atrial fibrillation: Secondary | ICD-10-CM | POA: Diagnosis not present

## 2018-03-03 DIAGNOSIS — E1122 Type 2 diabetes mellitus with diabetic chronic kidney disease: Secondary | ICD-10-CM

## 2018-03-03 DIAGNOSIS — D649 Anemia, unspecified: Secondary | ICD-10-CM

## 2018-03-03 DIAGNOSIS — E1149 Type 2 diabetes mellitus with other diabetic neurological complication: Secondary | ICD-10-CM

## 2018-03-03 DIAGNOSIS — I4821 Permanent atrial fibrillation: Secondary | ICD-10-CM

## 2018-03-03 DIAGNOSIS — I129 Hypertensive chronic kidney disease with stage 1 through stage 4 chronic kidney disease, or unspecified chronic kidney disease: Secondary | ICD-10-CM | POA: Diagnosis not present

## 2018-03-03 DIAGNOSIS — Z1231 Encounter for screening mammogram for malignant neoplasm of breast: Secondary | ICD-10-CM | POA: Diagnosis not present

## 2018-03-03 DIAGNOSIS — G8929 Other chronic pain: Secondary | ICD-10-CM

## 2018-03-03 DIAGNOSIS — M5136 Other intervertebral disc degeneration, lumbar region: Secondary | ICD-10-CM | POA: Diagnosis not present

## 2018-03-03 DIAGNOSIS — Z1239 Encounter for other screening for malignant neoplasm of breast: Secondary | ICD-10-CM

## 2018-03-03 LAB — POCT GLYCOSYLATED HEMOGLOBIN (HGB A1C): HbA1c, POC (controlled diabetic range): 8.4 % — AB (ref 0.0–7.0)

## 2018-03-03 LAB — POCT INR: INR: 2.6 (ref 2.0–3.0)

## 2018-03-03 MED ORDER — HYDROCODONE-ACETAMINOPHEN 5-325 MG PO TABS: 1.0000 | ORAL_TABLET | Freq: Three times a day (TID) | ORAL | 0 refills | Status: DC | PRN

## 2018-03-03 NOTE — Patient Instructions (Signed)
Three refills of your hydrocodone-acetaminophen have been sent to your pharmacy.  Your blood pressure looks good  Your diabetes is under good control.  Keep taking your diabetes medications like you are now.

## 2018-03-03 NOTE — Telephone Encounter (Signed)
-----   Message from Jettie Booze, MD sent at 03/02/2018  5:28 PM EDT ----- Hbg gradually trending down.  Has she had her stool checked for blood by PMD? Repeat CBC in 3 months.

## 2018-03-03 NOTE — Telephone Encounter (Signed)
Patient returning call. Made patient aware of lab results. Patient states that she saw her PCP today and she was given a specimen cup to take with her and return to check for the presence of blood. Repeat CBC scheduled for 8/30. Instructed for the patient to let us know if she has any other S/Sx of bleeding and to keep f/u with PCP. Patient verbalized understanding and thanked me for the call.

## 2018-03-04 ENCOUNTER — Encounter: Payer: Self-pay | Admitting: Family Medicine

## 2018-03-04 NOTE — Assessment & Plan Note (Signed)
Established problem Controlled Continue current therapy regiment.  

## 2018-03-04 NOTE — Progress Notes (Signed)
Subjective:    Patient ID: Abigail Duncan, female    DOB: Apr 14, 1941, 77 y.o.   MRN: [id number] Abigail Duncan is alone Sources of clinical information for visit is/are patient and past medical records. Nursing assessment for this office visit was reviewed with the patient for accuracy and revision.  Depression screen PHQ 2/9 03/03/2018  Decreased Interest 0  Down, Depressed, Hopeless 0  PHQ - 2 Score 0  Altered sleeping -  Tired, decreased energy -  Change in appetite -  Feeling bad or failure about yourself  -  Trouble concentrating -  Moving slowly or fidgety/restless -  Suicidal thoughts -  PHQ-9 Score -  Some recent data might be hidden   Fall Risk  03/03/2018 12/09/2017 09/09/2017 05/20/2017 03/19/2017  Falls in the past year? _0   Number falls in past yr: - - - - -  Injury with Fall? 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 [facility]) CHRONIC DIABETES  Disease Monitoring  Blood Sugar Ranges: not checking at home  Polyuria: no   Visual problems: no  Medication Compliance: yes , metformin 500 mg tab, two in morning and ne in evening Medication Side Effects  Hypoglycemia: no             Diarrhea: loose stools 3 times a day since going up on metformin to twice a day   [place]             Daily Aspirin: on warfarin             Statin: on Crestor 10 mg daily             Recent eGFR: 35 ml/min (12/09/17)             ACEI: Enalapril  Eye Exam: not in over 2 yrs  Diet pattern: fair  Exercise: none above basal activity level     Encounter for chronic pain management - Review of [place] without evidence of doctor shopping.    Benign hypertensive heart and kidney diseaseand chronic kidney disease stage 3 (HCC) CHRONIC HYPERTENSION  Disease Monitoring  Blood pressure range: not checking at home  Chest  pain: no   Dyspnea: no   Claudication: no  Medication compliance: taking enalapril, lasix, zaroxlyn, metoprolol  Medication Side Effects  Lightheadedness: no   Urinary frequency: no  Edema: not above usual baseline ankle edema    Preventitive Healthcare:   Salt Restriction: no        DDD (degenerative disc disease), lumbar  - chronic low back and bilateral klnees with DDD L5 - S1  - Taking Norco 5 / 325 TID - Reports adequate pain relief to be able to perform ADLs and iADLs - Denies constipation, falls, confusion nor cravings. - Denies alcohol and illicit substances      Low   Osteoarthritis of multiple joints - Primary - Morbid obeisty - see DDD above  Hypercholesterolemia No chest pain No weaknees or numbness in limbs or face.   Taking crestor daily No difficulty with speech events No RUQ pain No jaundice.     SH: no smoking   Review of Systems See hpi    Objective:   Physical Exam VS reviewed GEN: Alert, Cooperative,  Groomed, NAD, sitting in [place] COR: irr irr, No M/G/R, No JVD, Normal PMI size and location LUNGS: BCTA, No Acc mm use, speaking in full sentences EXT: No peripheral leg edema.SKIN: No lesion nor rashes of face/trunk/extremities Neuro: Oriented to person, place, and time; Psych: Normal affect/thought/speech/language        Assessment & Plan:  See problem list

## 2018-03-04 NOTE — Assessment & Plan Note (Signed)
Lab Results  Component Value Date   HGBA1C 8.4 (A) 03/03/2018   Improved with increase metformin to 1500 mg daily Continue current therapy.

## 2018-03-04 NOTE — Assessment & Plan Note (Signed)
Established problem Controlled Continue current therapy regiment. No red flags on review of [place] CSRS 3 Rx sent online Norco 5/325 tab #90; Rx fill now, Rx fill 30 days, Rx fill 60 days RTC 3 months

## 2018-03-10 ENCOUNTER — Other Ambulatory Visit: Payer: Self-pay | Admitting: *Deleted

## 2018-03-10 ENCOUNTER — Encounter: Payer: Self-pay | Admitting: Family Medicine

## 2018-03-10 DIAGNOSIS — Z1211 Encounter for screening for malignant neoplasm of colon: Secondary | ICD-10-CM

## 2018-03-11 ENCOUNTER — Encounter: Payer: Self-pay | Admitting: Family Medicine

## 2018-03-11 DIAGNOSIS — E11319 Type 2 diabetes mellitus with unspecified diabetic retinopathy without macular edema: Secondary | ICD-10-CM | POA: Insufficient documentation

## 2018-03-12 LAB — FECAL OCCULT BLOOD, IMMUNOCHEMICAL: Fecal Occult Bld: NEGATIVE

## 2018-03-14 ENCOUNTER — Encounter: Payer: Self-pay | Admitting: Family Medicine

## 2018-03-14 NOTE — Progress Notes (Signed)
Negaitve FIT test result sent by mail to patient

## 2018-03-17 ENCOUNTER — Encounter: Payer: Self-pay | Admitting: Family Medicine

## 2018-03-17 LAB — PTH, INTACT
ALBUMIN: 3.8
CO2: 30
PHOSPHORUS: 3.4
PTH: 91

## 2018-03-21 ENCOUNTER — Other Ambulatory Visit: Payer: Self-pay | Admitting: *Deleted

## 2018-03-21 MED ORDER — WARFARIN SODIUM 5 MG PO TABS: ORAL_TABLET | ORAL | 0 refills | Status: DC

## 2018-03-31 ENCOUNTER — Other Ambulatory Visit: Payer: Medicare Other

## 2018-04-04 ENCOUNTER — Ambulatory Visit: Payer: Medicare Other

## 2018-04-05 ENCOUNTER — Ambulatory Visit: Payer: Medicare Other

## 2018-04-06 ENCOUNTER — Other Ambulatory Visit: Payer: Self-pay | Admitting: Interventional Cardiology

## 2018-04-15 ENCOUNTER — Ambulatory Visit (INDEPENDENT_AMBULATORY_CARE_PROVIDER_SITE_OTHER): Payer: Medicare Other | Admitting: *Deleted

## 2018-04-15 DIAGNOSIS — I482 Chronic atrial fibrillation: Secondary | ICD-10-CM

## 2018-04-15 DIAGNOSIS — I4821 Permanent atrial fibrillation: Secondary | ICD-10-CM

## 2018-04-15 DIAGNOSIS — Z7901 Long term (current) use of anticoagulants: Secondary | ICD-10-CM | POA: Diagnosis not present

## 2018-04-15 LAB — POCT INR: INR: 2.7 (ref 2.0–3.0)

## 2018-04-22 ENCOUNTER — Other Ambulatory Visit: Payer: Self-pay | Admitting: Interventional Cardiology

## 2018-04-22 ENCOUNTER — Other Ambulatory Visit: Payer: Self-pay | Admitting: Cardiology

## 2018-04-29 ENCOUNTER — Other Ambulatory Visit: Payer: Self-pay | Admitting: Interventional Cardiology

## 2018-05-19 ENCOUNTER — Encounter: Payer: Self-pay | Admitting: Family Medicine

## 2018-05-19 ENCOUNTER — Other Ambulatory Visit: Payer: Self-pay

## 2018-05-19 ENCOUNTER — Ambulatory Visit (INDEPENDENT_AMBULATORY_CARE_PROVIDER_SITE_OTHER): Payer: Medicare Other | Admitting: Family Medicine

## 2018-05-19 VITALS — BP 124/68 | HR 62 | Temp 98.0°F | Wt 276.0 lb

## 2018-05-19 DIAGNOSIS — E1122 Type 2 diabetes mellitus with diabetic chronic kidney disease: Secondary | ICD-10-CM | POA: Diagnosis not present

## 2018-05-19 DIAGNOSIS — I481 Persistent atrial fibrillation: Secondary | ICD-10-CM | POA: Diagnosis not present

## 2018-05-19 DIAGNOSIS — M5136 Other intervertebral disc degeneration, lumbar region: Secondary | ICD-10-CM

## 2018-05-19 DIAGNOSIS — E1149 Type 2 diabetes mellitus with other diabetic neurological complication: Secondary | ICD-10-CM

## 2018-05-19 DIAGNOSIS — Z7901 Long term (current) use of anticoagulants: Secondary | ICD-10-CM

## 2018-05-19 DIAGNOSIS — G8929 Other chronic pain: Secondary | ICD-10-CM | POA: Diagnosis not present

## 2018-05-19 DIAGNOSIS — D649 Anemia, unspecified: Secondary | ICD-10-CM | POA: Insufficient documentation

## 2018-05-19 DIAGNOSIS — M159 Polyosteoarthritis, unspecified: Secondary | ICD-10-CM

## 2018-05-19 DIAGNOSIS — I129 Hypertensive chronic kidney disease with stage 1 through stage 4 chronic kidney disease, or unspecified chronic kidney disease: Secondary | ICD-10-CM

## 2018-05-19 DIAGNOSIS — I4819 Other persistent atrial fibrillation: Secondary | ICD-10-CM

## 2018-05-19 DIAGNOSIS — G4733 Obstructive sleep apnea (adult) (pediatric): Secondary | ICD-10-CM

## 2018-05-19 DIAGNOSIS — M150 Primary generalized (osteo)arthritis: Secondary | ICD-10-CM

## 2018-05-19 HISTORY — DX: Obstructive sleep apnea (adult) (pediatric): G47.33

## 2018-05-19 LAB — POCT GLYCOSYLATED HEMOGLOBIN (HGB A1C): HBA1C, POC (CONTROLLED DIABETIC RANGE): 8.3 % — AB (ref 0.0–7.0)

## 2018-05-19 LAB — POCT INR: INR: 3.2 — AB (ref 2.0–3.0)

## 2018-05-19 NOTE — Patient Instructions (Addendum)
YOur blood pressure is good today.   Keep taking your blood pressure medications as you currently are.  Your A1c is 8.3% which is Okay.  If you can tolerate it, increase the Metformin to two tablets in the morning and one tablet in the evening.   Please return to the [facility] in two weeks to recheck your INR.  It was 3.2 today.  Keep taking your warfarin just as you are.

## 2018-05-20 ENCOUNTER — Encounter: Payer: Self-pay | Admitting: Family Medicine

## 2018-05-20 DIAGNOSIS — N261 Atrophy of kidney (terminal): Secondary | ICD-10-CM | POA: Insufficient documentation

## 2018-05-20 DIAGNOSIS — I130 Hypertensive heart and chronic kidney disease with heart failure and stage 1 through stage 4 chronic kidney disease, or unspecified chronic kidney disease: Secondary | ICD-10-CM | POA: Insufficient documentation

## 2018-05-20 DIAGNOSIS — N183 Chronic kidney disease, stage 3 (moderate): Secondary | ICD-10-CM

## 2018-05-20 NOTE — Assessment & Plan Note (Signed)
Adequate blood pressure control.  No evidence of new end organ damage.  Tolerating medication without significant adverse effects.  Plan to continue current blood pressure regiment.   

## 2018-05-20 NOTE — Assessment & Plan Note (Signed)
Established problem Uncontrolled INR 3.2. No bleeding. Continue same warfarin dosing but recheck INR in two weeks.

## 2018-05-20 NOTE — Assessment & Plan Note (Addendum)
Lab Results  Component Value Date   HGBA1C 8.3 (A) 05/19/2018  Adequate control for age.   Recommend trial of metformin to 2 tablets in am (1000mg ) and one tablet in pm (500mg ).  Pt has a problem with diarrhea for a long time.  Will see if it worsens with increase in metformin.

## 2018-05-20 NOTE — Progress Notes (Signed)
Subjective:    Patient ID: Abigail Duncan, female    DOB: [birth year], 77 y.o.   MRN: [id number] Abigail Duncan is accompanied by son Sources of clinical information for visit is/are patient and past medical records. Nursing assessment for this office visit was reviewed with the patient for accuracy and revision.  Previous Report(s) Reviewed: historical medical records and [place] Controlled Substance Reporting System  Depression screen [facility] 2/9 03/03/2018  Decreased Interest 0  Down, Depressed, Hopeless 0  PHQ - 2 Score 0  Altered sleeping -  Tired, decreased energy -  Change in appetite -  Feeling bad or failure about yourself  -  Trouble concentrating -  Moving slowly or fidgety/restless -  Suicidal thoughts -  PHQ-9 Score -  Some recent data might be hidden   Fall Risk  03/03/2018 12/09/2017 09/09/2017 05/20/2017 03/19/2017  Falls in the past year? No No No No No  Number falls in past yr: - - - - -  Injury with Fall? - - - - -  Risk for fall due to : - - - - -    HPI  CHRONIC DIABETES  Disease Monitoring  Blood Sugar Ranges: running in 100s to 200s  Polyuria: no   Visual problems: no   Medication Compliance: no, taking metformin twice a day instead or prescribed 2 in morning and one in evening. She says this is what she was told.    Medication Side Effects  Hypoglycemia: no   Preventitive Health Care             Daily Aspirin: No              Statin: yes             Recent eGFR: 35 ml/min (12/09/17)             ACEI:   Eye Exam: next week scheduled  Foot Exam: today   DDD lumbar spine and Osteoarthritis of multiple joints  - Longstanding osteoarthritis of the bilateral knees, of lumbar spine with and left hip -  Able to perform all ADLs - Pain is on the both knees, hips, lumbar spine  - Quality: There is no swelling, no redness, or no increased warmth. The pain is described as aching. occasionally. There is sharp worsening  - Pattern: constant - Duration:  years Associated symptoms: Includes stiffness and weakness. There is no sleep loss and no instability.  Modifying factors: Includes nonweight bearing pai. There is sitting, and no night pain. There Norco medication does allow her to perform tranfers and perform ADLs  Assistive devices: WC  CHRONIC HYPERTENSION  Disease Monitoring  Blood pressure range: not checking  Chest pain: no   Dyspnea: no   Medication compliance: yes  Medication Side Effects  Lightheadedness: no   Urinary frequency: no   Edema: yes, little  Preventitive Healthcare:  Exercise: no   Diet Pattern: fair  Salt Restriction: no  SH: no smoking  Review of Systems See hpi    Objective:   Physical Exam VS reviewed GEN: Alert, Cooperative, Groomed, NAD COR: RRR, No M/G/R, No JVD, Normal PMI size and location LUNGS: BCTA, No Acc mm use, speaking in full sentences Psych: Normal affect/thought/speech/language  Diabetic Foot Exam - Simple   Simple Foot Form Diabetic Foot exam was performed with the following findings:  Yes 05/19/2018  1:50 PM  Visual Inspection No deformities, no ulcerations, no other skin breakdown bilaterally:  Yes Sensation Testing Intact to touch  and monofilament testing bilaterally:  Yes Pulse Check Posterior Tibialis and Dorsalis pulse intact bilaterally:  Yes Comments       Assessment & Plan:  Visit Problem List with A/P  No problem-specific Assessment & Plan notes found for this encounter.

## 2018-05-20 NOTE — Assessment & Plan Note (Signed)
[place] CSRS db reviewed.  No evidence doctor shopping. Continue Dr Abigail Duncan regiment Patient may call few days before 06/12/18 to request 3 month refill Need new control substance contract signed next visit and opiate urine screen next visit. RTC 3 months

## 2018-05-20 NOTE — Assessment & Plan Note (Signed)
Established problem. Stable. Continue current therapy  Norco 5/325 every 8 hrs prn  See "[place] for opiate therapy" problem for opioid Rx details.

## 2018-06-03 ENCOUNTER — Other Ambulatory Visit: Payer: Medicare Other

## 2018-06-07 ENCOUNTER — Other Ambulatory Visit: Payer: Self-pay | Admitting: Family Medicine

## 2018-06-07 DIAGNOSIS — M5136 Other intervertebral disc degeneration, lumbar region: Secondary | ICD-10-CM

## 2018-06-07 MED ORDER — HYDROCODONE-ACETAMINOPHEN 5-325 MG PO TABS: 1.0000 | ORAL_TABLET | Freq: Three times a day (TID) | ORAL | 0 refills | Status: DC | PRN

## 2018-06-07 NOTE — Telephone Encounter (Signed)
Hydrocodone cannot be filled yet by [facility] [address] because they told patient they need authorization by PCP.  Please call pharmacy for this, thank you.

## 2018-06-08 ENCOUNTER — Other Ambulatory Visit: Payer: Medicare Other | Admitting: *Deleted

## 2018-06-08 DIAGNOSIS — D649 Anemia, unspecified: Secondary | ICD-10-CM

## 2018-06-08 LAB — CBC
Hematocrit: 31.1 % — ABNORMAL LOW (ref 34.0–46.6)
Hemoglobin: 10.1 g/dL — ABNORMAL LOW (ref 11.1–15.9)
MCH: 26.5 pg — ABNORMAL LOW (ref 26.6–33.0)
MCHC: 32.5 g/dL (ref 31.5–35.7)
MCV: 82 fL (ref 79–97)
PLATELETS: 406 10*3/uL (ref 150–450)
RBC: 3.81 x10E6/uL (ref 3.77–5.28)
RDW: 16.3 % — AB (ref 12.3–15.4)
WBC: 8.6 10*3/uL (ref 3.4–10.8)

## 2018-06-13 ENCOUNTER — Telehealth: Payer: Self-pay

## 2018-06-13 ENCOUNTER — Other Ambulatory Visit: Payer: Self-pay | Admitting: Family Medicine

## 2018-06-13 DIAGNOSIS — D649 Anemia, unspecified: Secondary | ICD-10-CM

## 2018-06-13 NOTE — Telephone Encounter (Signed)
Notes recorded by Drue Novel I, RN on 06/13/2018 at 4:00 PM EDT Patient made aware of results and recommendations to repeat CBC in 3 months. Patient verbalizes understanding and thanked me for the call. Lab appointment for 12/9.   ------  Notes recorded by Jettie Booze, MD on 06/10/2018 at 4:15 PM EDT OK. SHe should have another CBC in 3 months. ------  Notes recorded by Cleon Gustin, RN on 06/08/2018 at 4:49 PM EDT Preliminary reviewed by nurse, awaiting MD signature Hemoglobin and hematocrit continue to trend down Hemoccult negative in June with PCP Patient on coumadin, managed by PCP

## 2018-06-13 NOTE — Telephone Encounter (Signed)
-----   Message from Jettie Booze, MD sent at 06/10/2018  4:15 PM EDT ----- Rosette Reveal should have another CBC in 3 months.

## 2018-06-14 ENCOUNTER — Ambulatory Visit: Payer: Medicare Other

## 2018-06-14 ENCOUNTER — Other Ambulatory Visit: Payer: Self-pay | Admitting: Family Medicine

## 2018-06-22 ENCOUNTER — Ambulatory Visit (INDEPENDENT_AMBULATORY_CARE_PROVIDER_SITE_OTHER): Payer: Medicare Other

## 2018-06-22 ENCOUNTER — Ambulatory Visit (INDEPENDENT_AMBULATORY_CARE_PROVIDER_SITE_OTHER): Payer: Medicare Other | Admitting: *Deleted

## 2018-06-22 DIAGNOSIS — Z23 Encounter for immunization: Secondary | ICD-10-CM

## 2018-06-22 DIAGNOSIS — Z7901 Long term (current) use of anticoagulants: Secondary | ICD-10-CM

## 2018-06-22 DIAGNOSIS — I4821 Permanent atrial fibrillation: Secondary | ICD-10-CM

## 2018-06-22 LAB — POCT INR: INR: 2.7 (ref 2.0–3.0)

## 2018-06-22 NOTE — Progress Notes (Signed)
Pt presents in nurse clinic for flu injection. Injection given in LD, site unremarkable. Epic and [facility] updated.

## 2018-07-06 ENCOUNTER — Other Ambulatory Visit: Payer: Medicare Other

## 2018-07-12 ENCOUNTER — Other Ambulatory Visit: Payer: Self-pay | Admitting: Family Medicine

## 2018-07-12 DIAGNOSIS — M5136 Other intervertebral disc degeneration, lumbar region: Secondary | ICD-10-CM

## 2018-07-12 DIAGNOSIS — Z7409 Other reduced mobility: Secondary | ICD-10-CM

## 2018-07-12 DIAGNOSIS — M150 Primary generalized (osteo)arthritis: Secondary | ICD-10-CM

## 2018-07-12 DIAGNOSIS — M51369 Other intervertebral disc degeneration, lumbar region without mention of lumbar back pain or lower extremity pain: Secondary | ICD-10-CM

## 2018-07-12 DIAGNOSIS — M159 Polyosteoarthritis, unspecified: Secondary | ICD-10-CM

## 2018-07-12 DIAGNOSIS — I272 Pulmonary hypertension, unspecified: Secondary | ICD-10-CM

## 2018-07-12 DIAGNOSIS — E1140 Type 2 diabetes mellitus with diabetic neuropathy, unspecified: Secondary | ICD-10-CM

## 2018-07-12 NOTE — Progress Notes (Signed)
Dr Abigail Duncan received request for completion of application for motorized scooter/wheelchair for Abigail Duncan.  Dr Abigail Duncan does not do these evaluations.    A referral to [facility] Neurorehabilitation wheelchair clinic has been submitted by Dr Abigail Duncan.    Neurorehab wheelchair clinic can assess if Abigail Duncan qualifies for a motorized wc and if she does, the appropriate type/fit motorized wc.

## 2018-07-14 ENCOUNTER — Encounter: Payer: Self-pay | Admitting: Family Medicine

## 2018-07-14 ENCOUNTER — Other Ambulatory Visit: Payer: Self-pay

## 2018-07-14 ENCOUNTER — Ambulatory Visit (INDEPENDENT_AMBULATORY_CARE_PROVIDER_SITE_OTHER): Payer: Medicare Other | Admitting: Family Medicine

## 2018-07-14 VITALS — BP 110/62 | Ht 63.0 in

## 2018-07-14 DIAGNOSIS — M5136 Other intervertebral disc degeneration, lumbar region: Secondary | ICD-10-CM

## 2018-07-14 DIAGNOSIS — Z5181 Encounter for therapeutic drug level monitoring: Secondary | ICD-10-CM

## 2018-07-14 DIAGNOSIS — I4821 Permanent atrial fibrillation: Secondary | ICD-10-CM

## 2018-07-14 DIAGNOSIS — G894 Chronic pain syndrome: Secondary | ICD-10-CM

## 2018-07-14 DIAGNOSIS — Z79899 Other long term (current) drug therapy: Secondary | ICD-10-CM

## 2018-07-14 DIAGNOSIS — Z7901 Long term (current) use of anticoagulants: Secondary | ICD-10-CM | POA: Diagnosis not present

## 2018-07-14 DIAGNOSIS — N1832 Chronic kidney disease, stage 3b: Secondary | ICD-10-CM

## 2018-07-14 DIAGNOSIS — D649 Anemia, unspecified: Secondary | ICD-10-CM

## 2018-07-14 DIAGNOSIS — N183 Chronic kidney disease, stage 3 (moderate): Secondary | ICD-10-CM

## 2018-07-14 DIAGNOSIS — R269 Unspecified abnormalities of gait and mobility: Secondary | ICD-10-CM

## 2018-07-14 DIAGNOSIS — E1149 Type 2 diabetes mellitus with other diabetic neurological complication: Secondary | ICD-10-CM

## 2018-07-14 DIAGNOSIS — I272 Pulmonary hypertension, unspecified: Secondary | ICD-10-CM

## 2018-07-14 DIAGNOSIS — M51369 Other intervertebral disc degeneration, lumbar region without mention of lumbar back pain or lower extremity pain: Secondary | ICD-10-CM

## 2018-07-14 LAB — POCT INR: INR: 3.2 — AB (ref 2.0–3.0)

## 2018-07-14 MED ORDER — HYDROCODONE-ACETAMINOPHEN 5-325 MG PO TABS: 1.0000 | ORAL_TABLET | Freq: Three times a day (TID) | ORAL | 0 refills | Status: DC | PRN

## 2018-07-14 NOTE — Patient Instructions (Signed)
We have made a referral to wheelchair clinic at [facility] about the application for a motorized wheelchair.

## 2018-07-15 ENCOUNTER — Encounter: Payer: Self-pay | Admitting: Family Medicine

## 2018-07-15 DIAGNOSIS — Z7901 Long term (current) use of anticoagulants: Secondary | ICD-10-CM

## 2018-07-15 DIAGNOSIS — Z5181 Encounter for therapeutic drug level monitoring: Secondary | ICD-10-CM

## 2018-07-15 DIAGNOSIS — R269 Unspecified abnormalities of gait and mobility: Secondary | ICD-10-CM | POA: Insufficient documentation

## 2018-07-15 DIAGNOSIS — N183 Chronic kidney disease, stage 3 (moderate): Secondary | ICD-10-CM

## 2018-07-15 DIAGNOSIS — G894 Chronic pain syndrome: Secondary | ICD-10-CM

## 2018-07-15 DIAGNOSIS — N1832 Chronic kidney disease, stage 3b: Secondary | ICD-10-CM | POA: Insufficient documentation

## 2018-07-15 HISTORY — DX: Long term (current) use of anticoagulants: Z79.01

## 2018-07-15 HISTORY — DX: Chronic pain syndrome: G89.4

## 2018-07-15 HISTORY — DX: Encounter for therapeutic drug level monitoring: Z51.81

## 2018-07-15 LAB — CBC
Hematocrit: 31.5 % — ABNORMAL LOW (ref 34.0–46.6)
Hemoglobin: 10 g/dL — ABNORMAL LOW (ref 11.1–15.9)
MCH: 24.9 pg — ABNORMAL LOW (ref 26.6–33.0)
MCHC: 31.7 g/dL (ref 31.5–35.7)
MCV: 79 fL (ref 79–97)
Platelets: 421 x10E3/uL (ref 150–450)
RBC: 4.01 x10E6/uL (ref 3.77–5.28)
RDW: 16 % — ABNORMAL HIGH (ref 12.3–15.4)
WBC: 11.9 x10E3/uL — ABNORMAL HIGH (ref 3.4–10.8)

## 2018-07-15 LAB — BASIC METABOLIC PANEL WITH GFR
BUN/Creatinine Ratio: 20 (ref 12–28)
BUN: 37 mg/dL — ABNORMAL HIGH (ref 8–27)
CO2: 25 mmol/L (ref 20–29)
Calcium: 9.7 mg/dL (ref 8.7–10.3)
Chloride: 92 mmol/L — ABNORMAL LOW (ref 96–106)
Creatinine, Ser: 1.81 mg/dL — ABNORMAL HIGH (ref 0.57–1.00)
GFR calc Af Amer: 31 mL/min/1.73 — ABNORMAL LOW
GFR calc non Af Amer: 27 mL/min/1.73 — ABNORMAL LOW
Glucose: 174 mg/dL — ABNORMAL HIGH (ref 65–99)
Potassium: 4.4 mmol/L (ref 3.5–5.2)
Sodium: 137 mmol/L (ref 134–144)

## 2018-07-15 NOTE — Assessment & Plan Note (Signed)
Established problem. Stable. Continue current therapy  Norco 5/325 every 8 hrs prn  See "[place] for opiate therapy" problem for opioid Rx details.

## 2018-07-15 NOTE — Assessment & Plan Note (Signed)
Established problem Adequate analgesia No adverse effects. Able to complete ADLs with use of hydrocodone-apap 5/325 1 q8h prn. No aberrant behaviors -  CSRS checked.   Continue current therapy regiment. 3 monthly prescriptions hydrocodone 5-325 #90 sent online to pharmacy.

## 2018-07-15 NOTE — Assessment & Plan Note (Signed)
Lab Results  Component Value Date   CREATININE 1.81 (H) 07/14/2018  Around baseline SCr.  On enalapril 10 mg daily (Max 40) Need check urine prot/Cr next ov.

## 2018-07-15 NOTE — Assessment & Plan Note (Addendum)
Established problem Controlled Weight down 5 lbs from last ov No symptoms of hypovolemia/hypotension Continue lasix 40 mg daily and metolazone 2.5 mg three times a week F/U appointment with cardiology in December

## 2018-07-15 NOTE — Assessment & Plan Note (Signed)
Lab Results  Component Value Date   HCT 31.5 (L) 07/14/2018  Stable Continue Warfarin

## 2018-07-15 NOTE — Assessment & Plan Note (Signed)
Established problem Controlled Continue current rate and rhythm control therapy regiment.

## 2018-07-15 NOTE — Progress Notes (Signed)
Subjective:    Patient ID: Abigail Duncan, female    DOB: 1941/03/29, 77 y.o.   MRN: [id number] [facility] is accompanied by son Sources of clinical information for visit is/are patient, relative(s) and past medical records. Nursing assessment for this office visit was reviewed with the patient for accuracy and revision.  Previous Report(s) Reviewed: historical medical records  Depression screen [facility] 2/9 07/14/2018  Decreased Interest 0  Down, Depressed, Hopeless -  PHQ - 2 Score 0  Altered sleeping -  Tired, decreased energy -  Change in appetite -  Feeling bad or failure about yourself  -  Trouble concentrating -  Moving slowly or fidgety/restless -  Suicidal thoughts -  PHQ-9 Score -  Some recent data might be hidden   Fall Risk  03/03/2018 12/09/2017 09/09/2017 05/20/2017 03/19/2017  Falls in the past year? No No No No No  Number falls in past yr: - - - - -  Injury with Fall? - - - - -  Risk for fall due to : - - - - -     Adult vaccines due  Topic Date Due  . TETANUS/TDAP  12/17/2024    Diabetes Health Maintenance Due  Topic Date Due  . HEMOGLOBIN A1C  11/19/2018  . OPHTHALMOLOGY EXAM  01/14/2019  . FOOT EXAM  05/20/2019   There are no preventive care reminders to display for this patient.   HPI  Chronic Pain Syndrome  CHRONIC DIABETES  Disease Monitoring  Blood Sugar Ranges:   Polyuria: no   Visual problems: no   Medication Compliance: yes  Medication Side Effects  Hypoglycemia: no   Preventitive Health Care                          Statin: crestor              Gait Problem - Longstanding problem for pt - Pt uses her son's WC to get around their home.  She rarely gets up from [facility] because of pain in back and knee - She is requesting evaluation for a motorized WC, "[employer]overround"    SH: Smoking status reviewed.  Review of Systems     Objective:   Physical Exam  VS reviewed GEN: Alert, Cooperative, Groomed, NA  COR: irr irr, No M LUNGS:  BCTA, No Acc mm use, speaking in full sentences  EXT: trace peripheral leg edema Neuro: Oriented to person, place, and time Gait: Normal speed, No significant path deviation, Step through +,  Psych: Normal affect/thought/speech/language   Assessment & Plan:  Visit Problem List with A/P  Stage 3b chronic kidney disease ([place]) Lab Results  Component Value Date   CREATININE 1.81 (H) 07/14/2018  Around baseline SCr.  On enalapril 10 mg daily (Max 40) Need check urine prot/Cr next ov.    Pulmonary hypertension, moderate to severe (HCC) Established problem Controlled Weight down 5 lbs from last ov No symptoms of hypovolemia/hypotension Continue lasix 40 mg daily and metolazone 2.5 mg three times a week F/U appointment with cardiology in December   Normocytic anemia Lab Results  Component Value Date   HCT 31.5 (L) 07/14/2018  Stable Continue Warfarin   Anticoagulation goal of INR 2 to 3 INR 3.2.  No evidence bleeding.  Continue current Warfarin schedule with recheck in 2 weeks.   Type II diabetes mellitus with neurological manifestations [facility]) Lab Results  Component Value Date   HGBA1C 8.3 (A) 05/19/2018  Stable  Continue current therapy   Atrial fibrillation, permanent (HCC) Established problem Controlled Continue current rate and rhythm control therapy regiment.   DDD (degenerative disc disease), lumbar Established problem. Stable. Continue current therapy  Norco 5/325 every 8 hrs prn  See "[place] for opiate therapy" problem for opioid Rx details.   Chronic pain syndrome Established problem Adequate analgesia No adverse effects. Able to complete ADLs with use of hydrocodone-apap 5/325 1 q8h prn. No aberrant behaviors - [place] CSRS checked.   Continue current therapy regiment. 3 monthly prescriptions hydrocodone 5-325 #90 sent online to pharmacy.    Gait difficulty Patient interested in motorized Wheelchair to help with activities within home.  Ambulation  limited by osteoarthritis of multiple site and diabetic peripheral neuropathy.  Referral made to [facility] for evaluation whether she qualifies for a motorized WC, and if she does, fitting for appropriate motorized WC and accessories.  If Ms Linehan qualifies for motorized [facility], Dr McDiarmid will sign paperwork from Neuro[facility] to authorize DME thru Medicare.

## 2018-07-15 NOTE — Assessment & Plan Note (Addendum)
Patient interested in motorized Wheelchair to help with activities within home.  Ambulation limited by osteoarthritis of multiple site and diabetic peripheral neuropathy.  Referral made to [facility] for evaluation whether she qualifies for a motorized WC, and if she does, fitting for appropriate motorized WC and accessories.  If Abigail Duncan qualifies for motorized [facility], Dr Ambriana Selway will sign paperwork from Neuro[facility] to authorize DME thru Medicare.

## 2018-07-15 NOTE — Assessment & Plan Note (Signed)
INR 3.2.  No evidence bleeding.  Continue current Warfarin schedule with recheck in 2 weeks.

## 2018-07-15 NOTE — Assessment & Plan Note (Signed)
Lab Results  Component Value Date   HGBA1C 8.3 (A) 05/19/2018  Stable Continue current therapy

## 2018-07-23 ENCOUNTER — Other Ambulatory Visit: Payer: Self-pay | Admitting: Family Medicine

## 2018-07-28 ENCOUNTER — Ambulatory Visit: Payer: Medicare Other

## 2018-08-02 ENCOUNTER — Ambulatory Visit: Payer: Medicare Other

## 2018-08-03 ENCOUNTER — Other Ambulatory Visit: Payer: Self-pay | Admitting: Family Medicine

## 2018-08-16 ENCOUNTER — Ambulatory Visit: Payer: Medicare Other

## 2018-08-18 ENCOUNTER — Ambulatory Visit: Payer: Medicare Other

## 2018-09-05 ENCOUNTER — Ambulatory Visit: Payer: Medicare Other | Admitting: Physical Therapy

## 2018-09-05 ENCOUNTER — Other Ambulatory Visit: Payer: Self-pay | Admitting: Family Medicine

## 2018-09-08 ENCOUNTER — Other Ambulatory Visit: Payer: Self-pay

## 2018-09-08 ENCOUNTER — Ambulatory Visit (INDEPENDENT_AMBULATORY_CARE_PROVIDER_SITE_OTHER): Payer: Medicare Other | Admitting: Family Medicine

## 2018-09-08 ENCOUNTER — Encounter: Payer: Self-pay | Admitting: Family Medicine

## 2018-09-08 VITALS — BP 130/74 | HR 90 | Temp 98.7°F | Ht 63.0 in

## 2018-09-08 DIAGNOSIS — E1149 Type 2 diabetes mellitus with other diabetic neurological complication: Secondary | ICD-10-CM

## 2018-09-08 DIAGNOSIS — I4821 Permanent atrial fibrillation: Secondary | ICD-10-CM | POA: Diagnosis not present

## 2018-09-08 DIAGNOSIS — G8929 Other chronic pain: Secondary | ICD-10-CM

## 2018-09-08 DIAGNOSIS — M5136 Other intervertebral disc degeneration, lumbar region: Secondary | ICD-10-CM | POA: Diagnosis not present

## 2018-09-08 DIAGNOSIS — L929 Granulomatous disorder of the skin and subcutaneous tissue, unspecified: Secondary | ICD-10-CM

## 2018-09-08 DIAGNOSIS — Z7901 Long term (current) use of anticoagulants: Secondary | ICD-10-CM | POA: Diagnosis not present

## 2018-09-08 LAB — POCT INR: INR: 2.5 (ref 2.0–3.0)

## 2018-09-08 LAB — POCT GLYCOSYLATED HEMOGLOBIN (HGB A1C): HbA1c, POC (controlled diabetic range): 7.8 % — AB (ref 0.0–7.0)

## 2018-09-08 MED ORDER — HYDROCODONE-ACETAMINOPHEN 5-325 MG PO TABS: 1.0000 | ORAL_TABLET | Freq: Three times a day (TID) | ORAL | 0 refills | Status: DC | PRN

## 2018-09-08 MED ORDER — HYDROCODONE-ACETAMINOPHEN 5-325 MG PO TABS: 1.0000 | ORAL_TABLET | Freq: Three times a day (TID) | ORAL | 0 refills | Status: AC | PRN

## 2018-09-08 NOTE — Patient Instructions (Signed)
Dr Lakashia Collison sent in [place] of your Hydrocodone-acetaminophen tablets to be filled in January, February and March 2020.   Dr Decorey Wahlert believes the skin on your left foot toe is from a condition called "Proud Flesh".  It is an overgrowth of tiny blood vessels that will not let the skin close over it.   The Silver nitrate application should help stop the proud flesh and allow regular skin to close the skin opening.  If the bleeding returns, roll the tip of the Silver Nitrate stick over the site for about a minute until it stops bleeding.  It may take several times of applying the silvver nitrate to make the wound close up for good.

## 2018-09-08 NOTE — Patient Outreach (Signed)
[facility]) Care Management  09/08/2018  [name] [birth year] [id number]   Medication Adherence call to Abigail Duncan spoke with patient she does  not need any Rosuvastatin 20 mg at this time patient has plenty for 90 more days.[name] is showing past due under [place].   [place] Management Direct Dial [phone]  Fax [phone] [email or web address]

## 2018-09-09 ENCOUNTER — Encounter: Payer: Self-pay | Admitting: Family Medicine

## 2018-09-09 DIAGNOSIS — L929 Granulomatous disorder of the skin and subcutaneous tissue, unspecified: Secondary | ICD-10-CM

## 2018-09-09 HISTORY — DX: Granulomatous disorder of the skin and subcutaneous tissue, unspecified: L92.9

## 2018-09-09 NOTE — Progress Notes (Signed)
Subjective:    Patient ID: Abigail Duncan, female    DOB: [birth year], 77 y.o.   MRN: [id number] Abigail Duncan is accompanied by son Sources of clinical information for visit is/are patient and past medical records. Nursing assessment for this office visit was reviewed with the patient for accuracy and revision.  Previous Report(s) Reviewed: historical medical records and [place] Controlled Substance Reporting System  Depression screen [facility] 2/9 09/08/2018  Decreased Interest 0  Down, Depressed, Hopeless 0  PHQ - 2 Score 0  Altered sleeping -  Tired, decreased energy -  Change in appetite -  Feeling bad or failure about yourself  -  Trouble concentrating -  Moving slowly or fidgety/restless -  Suicidal thoughts -  PHQ-9 Score -  Some recent data might be hidden   Fall Risk  09/08/2018 03/03/2018 12/09/2017 09/09/2017 05/20/2017  Falls in the past year? 0 No No No No  Number falls in past yr: - - - - -  Injury with Fall? - - - - -  Risk for fall due to : - - - - -    HPI  CHRONIC DIABETES  Disease Monitoring  Blood Sugar Ranges: running in 100s to 200s  Polyuria: no   Visual problems: no   Medication Compliance: Taking metformin 500 mg tab, two in am and one in PM Medication Side Effects  Hypoglycemia: no   Preventitive Health Care             Daily Aspirin: No              Statin: yes             Recent eGFR: 35 ml/min (12/09/17)             ACEI:   Eye Exam: next week scheduled  Foot Exam: today   DDD lumbar spine and Osteoarthritis of multiple joints  - Longstanding osteoarthritis of the bilateral knees, of lumbar spine with and left hip -  Able to perform all ADLs - Pain is on the both knees, hips, lumbar spine  - Quality: There is no swelling, no redness, or no increased warmth. The pain is described as aching. occasionally. There is sharp worsening  - Pattern: constant - Duration: years Associated symptoms: Includes stiffness and weakness. There is no sleep loss and  no instability.  Modifying factors: Includes nonweight bearing pai. There is sitting, and no night pain. There Norco medication does allow her to perform tranfers and perform ADLs  Assistive devices: WC  CHRONIC HYPERTENSION  Disease Monitoring  Blood pressure range: not checking  Chest pain: no   Dyspnea: no   Medication compliance: yes  Medication Side Effects  Lightheadedness: no   Urinary frequency: no   Edema: yes, little  Preventitive Healthcare:  Exercise: no   Diet Pattern: fair  Salt Restriction: no  Toe wound - Present couple months - present before son trimmed toe nails - tiny red bleeding spot  Review of Systems See hpi    Objective:   Physical Exam VS reviewed GEN: Alert, Cooperative, Groomed, NAD COR: RRR, No M/G/R, No JVD, Normal PMI size and location LUNGS: BCTA, No Acc mm use, speaking in full sentences Psych: Normal affect/thought/speech/language Exr: tip left third toe ~1 mm blood expressible Bright red speck     Assessment & Plan:  Visit Problem List with A/P  Type II diabetes mellitus with neurological manifestations ([place]) Established problem that has improved.  Lab Results  Component Value  Date   HGBA1C 7.8 (A) 09/08/2018  Continue Metformin 500 mg tab, two tab in am one in pm    DDD (degenerative disc disease), lumbar Established problem Controlled Continue current therapy regiment. No red flagson review of [place] CSRS 3 Rxsent onlineNorco 5/325 tab #90;fill 10/09/18; 11/09/18; 12/09/18 RTC 3 months  Proud flesh New problem Tip left 3rd toe Treated with Silver Nitrate stick Stick given to pt to apply showed site return.

## 2018-09-09 NOTE — Assessment & Plan Note (Signed)
Established problem that has improved.  Lab Results  Component Value Date   HGBA1C 7.8 (A) 09/08/2018  Continue Metformin 500 mg tab, two tab in am one in pm

## 2018-09-09 NOTE — Assessment & Plan Note (Signed)
New problem Tip left 3rd toe Treated with Silver Nitrate stick Stick given to pt to apply showed site return.

## 2018-09-09 NOTE — Assessment & Plan Note (Signed)
Established problem Controlled Continue current therapy regiment. No red flagson review of [place] CSRS 3 Rxsent onlineNorco 5/325 tab #90;fill 10/09/18; 11/09/18; 12/09/18 RTC 3 months

## 2018-09-12 ENCOUNTER — Other Ambulatory Visit: Payer: Medicare Other

## 2018-09-15 ENCOUNTER — Other Ambulatory Visit: Payer: Medicare Other

## 2018-09-19 ENCOUNTER — Other Ambulatory Visit: Payer: Medicare Other | Admitting: *Deleted

## 2018-09-19 DIAGNOSIS — D649 Anemia, unspecified: Secondary | ICD-10-CM

## 2018-09-19 LAB — CBC
Hematocrit: 29.8 % — ABNORMAL LOW (ref 34.0–46.6)
Hemoglobin: 9.5 g/dL — ABNORMAL LOW (ref 11.1–15.9)
MCH: 24.9 pg — ABNORMAL LOW (ref 26.6–33.0)
MCHC: 31.9 g/dL (ref 31.5–35.7)
MCV: 78 fL — ABNORMAL LOW (ref 79–97)
Platelets: 429 10*3/uL (ref 150–450)
RBC: 3.81 x10E6/uL (ref 3.77–5.28)
RDW: 16.9 % — AB (ref 12.3–15.4)
WBC: 10.3 10*3/uL (ref 3.4–10.8)

## 2018-10-10 ENCOUNTER — Telehealth: Payer: Self-pay | Admitting: Family Medicine

## 2018-10-10 NOTE — Telephone Encounter (Signed)
I spoke with Abigail Duncan by phone. We talked about her lower hgb result from her cardiologist's office.  She denies BRBPR, melena. She denies palpitation, dyspnea, syncope  She did have a fall when getting into bed bc a knee "gave out" on her.  She had to call EMS (local fire dep't) to help her off floor.  She denis injuring herself with fall.  She denies hitting her head during fall.  She again denied loss of consciousness.   A/  Anemia

## 2018-10-10 NOTE — Progress Notes (Signed)
A/ [place] anemia           - Last colonoscopy 2013 - reported as without abnormality per patient.      Knee giving way     Fall without injury, low energy  P/ next ov 1/16 check CBC and anemia panel.      FOBT

## 2018-10-20 ENCOUNTER — Ambulatory Visit: Payer: Medicare Other | Admitting: Family Medicine

## 2018-10-24 ENCOUNTER — Ambulatory Visit: Payer: Medicare Other | Admitting: Physical Therapy

## 2018-10-27 ENCOUNTER — Other Ambulatory Visit: Payer: Self-pay

## 2018-10-27 ENCOUNTER — Encounter: Payer: Self-pay | Admitting: Family Medicine

## 2018-10-27 ENCOUNTER — Ambulatory Visit (INDEPENDENT_AMBULATORY_CARE_PROVIDER_SITE_OTHER): Payer: Medicare Other | Admitting: Family Medicine

## 2018-10-27 VITALS — BP 128/74 | HR 80 | Temp 98.1°F | Ht 63.0 in

## 2018-10-27 DIAGNOSIS — D500 Iron deficiency anemia secondary to blood loss (chronic): Secondary | ICD-10-CM

## 2018-10-27 DIAGNOSIS — E538 Deficiency of other specified B group vitamins: Secondary | ICD-10-CM

## 2018-10-27 DIAGNOSIS — D649 Anemia, unspecified: Secondary | ICD-10-CM

## 2018-10-27 DIAGNOSIS — E1140 Type 2 diabetes mellitus with diabetic neuropathy, unspecified: Secondary | ICD-10-CM

## 2018-10-27 DIAGNOSIS — I4821 Permanent atrial fibrillation: Secondary | ICD-10-CM

## 2018-10-27 DIAGNOSIS — D75839 Thrombocytosis, unspecified: Secondary | ICD-10-CM

## 2018-10-27 DIAGNOSIS — D473 Essential (hemorrhagic) thrombocythemia: Secondary | ICD-10-CM

## 2018-10-27 DIAGNOSIS — Z7901 Long term (current) use of anticoagulants: Secondary | ICD-10-CM | POA: Diagnosis not present

## 2018-10-27 DIAGNOSIS — E1142 Type 2 diabetes mellitus with diabetic polyneuropathy: Secondary | ICD-10-CM

## 2018-10-27 LAB — POCT INR: INR: 2.6 (ref 2.0–3.0)

## 2018-10-27 MED ORDER — GABAPENTIN 300 MG PO CAPS: 900.0000 mg | ORAL_CAPSULE | Freq: Three times a day (TID) | ORAL | 2 refills | Status: DC

## 2018-10-27 NOTE — Patient Instructions (Signed)
We are checking your hemoglobin and iron levels today today. If your hemoglobin continues to go down or your iron level is low, then we will schedule to see the gastroenterologist see if there is a place in your stomach or intestines that are bleeding.    We are asking you to check your stools for blood using the blood detecting card we gave you.  If it shows blood that will be another reason for you to see the gastroenterologist.    For your painful feet, Dr Cai Anfinson believes it is from diabetes damage to the nerves in your feet.  He recommends increasing your Gabapentin 300 mg capsules from 2 capsules three times a day to 3 capsules three times a day.   When the pain is very severe, you may increase to 4 capsules temporarily until the pain is down some.    We will see you back in about 6  Weeks to see how the pain in your feet are doing.

## 2018-10-28 ENCOUNTER — Encounter: Payer: Self-pay | Admitting: Family Medicine

## 2018-10-28 ENCOUNTER — Telehealth: Payer: Self-pay | Admitting: Family Medicine

## 2018-10-28 DIAGNOSIS — E538 Deficiency of other specified B group vitamins: Secondary | ICD-10-CM | POA: Insufficient documentation

## 2018-10-28 DIAGNOSIS — D473 Essential (hemorrhagic) thrombocythemia: Secondary | ICD-10-CM | POA: Insufficient documentation

## 2018-10-28 DIAGNOSIS — D509 Iron deficiency anemia, unspecified: Secondary | ICD-10-CM | POA: Insufficient documentation

## 2018-10-28 DIAGNOSIS — D75839 Thrombocytosis, unspecified: Secondary | ICD-10-CM | POA: Insufficient documentation

## 2018-10-28 LAB — ANEMIA PANEL
Ferritin: 15 ng/mL (ref 15–150)
Folate, Hemolysate: 420 ng/mL
Folate, RBC: 1359 ng/mL (ref >498)
Hematocrit: 30.9 % — ABNORMAL LOW (ref 34.0–46.6)
Iron Saturation: 8 % — CL (ref 15–55)
Iron: 28 ug/dL (ref 27–139)
RETIC CT PCT: 1.9 % (ref 0.6–2.6)
Total Iron Binding Capacity: 371 ug/dL (ref 250–450)
UIBC: 343 ug/dL (ref 118–369)
VITAMIN B 12: 200 pg/mL — AB (ref 232–1245)

## 2018-10-28 LAB — CBC
Hemoglobin: 9.6 g/dL — ABNORMAL LOW (ref 11.1–15.9)
MCH: 24.4 pg — ABNORMAL LOW (ref 26.6–33.0)
MCHC: 31.1 g/dL — ABNORMAL LOW (ref 31.5–35.7)
MCV: 79 fL (ref 79–97)
Platelets: 470 10*3/uL — ABNORMAL HIGH (ref 150–450)
RBC: 3.93 x10E6/uL (ref 3.77–5.28)
RDW: 16.8 % — ABNORMAL HIGH (ref 11.7–15.4)
WBC: 11.4 10*3/uL — ABNORMAL HIGH (ref 3.4–10.8)

## 2018-10-28 NOTE — Assessment & Plan Note (Signed)
Established problem worsened.  Will increase Gabapentin to 900 mg TID with prn use of a 1200 mg dose if pain in feet has flared.  Other future options SNRI, topicals (lidocaine, capsaicin, diclofenac gel) Add tramadol or tapentadol for their neurotransmitter effect TENS units?

## 2018-10-28 NOTE — Assessment & Plan Note (Signed)
New problem Will need future follow up May be result of the iron deficiency, if so, should improve with repletion of iron stores. If does not improve or worsens, concern that it could be manafestation of chronic infection, malignancy, myeloproliferative DO  Recheck in month

## 2018-10-28 NOTE — Assessment & Plan Note (Signed)
New problem with further work up [place] Ref Rng & Units 10/27/2018 09/19/2018 07/14/2018  WBC 3.4 - 10.8 x10E3/uL 11.4(H) 10.3 11.9(H)  Hemoglobin 11.1 - 15.9 g/dL 9.6(L) 9.5(L) 10.0(L)  Hematocrit 34.0 - 46.6 % 30.9(L) 29.8(L) 31.5(L)  Platelets 150 - 450 x10E3/uL 470(H) 429 421   MCV 79, RDW 16.8 Lab Results  Component Value Date   FERRITIN 15 10/27/2018   Lab Results  Component Value Date   INR 2.6 10/27/2018   INR 2.5 09/08/2018   INR 3.2 (A) 07/14/2018   Inappropriately low retic count, low transferrin RBC indices and ferritin level all c/w iron deficiency anemia.  No evidence of excessive anticoagulation currently. Additionally, serum Vitamin B12 level is low  Plan: referral to [place] for evaluation and consideration of diagnostic colonoscopy.            Start one iron tablet(OTC 325 mg) daily           Start Vitamin B12 1000 mcg daily           Repeat CBC in month

## 2018-10-28 NOTE — Progress Notes (Signed)
Subjective:    Patient ID: Abigail Duncan, female    DOB: Nov 20, 1940, 78 y.o.   MRN: [id number] RAYMOND AZURE is accompanied by son Sources of clinical information for visit is/are patient and past medical records. Nursing assessment for this office visit was reviewed with the patient for accuracy and revision.   Previous Report(s) Reviewed: historical medical records  Depression screen [facility] 2/9 09/08/2018  Decreased Interest 0  Down, Depressed, Hopeless 0  PHQ - 2 Score 0  Altered sleeping -  Tired, decreased energy -  Change in appetite -  Feeling bad or failure about yourself  -  Trouble concentrating -  Moving slowly or fidgety/restless -  Suicidal thoughts -  PHQ-9 Score -  Some recent data might be hidden   Fall Risk  09/08/2018 03/03/2018 12/09/2017 09/09/2017 05/20/2017  Falls in the past year? 0 No No No No  Number falls in past yr: - - - - -  Injury with Fall? - - - - -  Risk for fall due to : - - - - -    History/P.E. limitations: none  Adult vaccines due  Topic Date Due  . TETANUS/TDAP  12/17/2024    Diabetes Health Maintenance Due  Topic Date Due  . OPHTHALMOLOGY EXAM  01/14/2019  . HEMOGLOBIN A1C  03/10/2019  . FOOT EXAM  05/20/2019   There are no preventive care reminders to display for this patient.   Chief Complaint  Patient presents with  . Foot Pain  . Anemia     HPI  Foot Pain Onset: years ago, dx by Dr Su Hoff 2015 Location: bilateral feet to ankles Quality: burning, painful cold,  Severity: severe Function: interferes with sleep Pattern: increase nightly Course: increase gradually over last year, accelerated over last few months Radiation: no Relief: gabapentin (pt notes that she can tell when she has not taken her gabapentin that pain increases), 600 mg TID Precipitant: diabetes Associated Symptoms: Patient reports presence of tingling, pins and needles, numbness,                                         Patient denies itching, touch  hypoesthesia, touch hyperesthesia   Context: Trauma (Acute or Chronic): no Prior Diagnostic Testing or Treatments: clinical diagnosis of diabetic peripheral neuropathy by Dr Su Hoff 2015 Relevant PMH/PSH: no spinal surgeries  Anemia History elements: Onset / Duration: 05/21/17 Patient denies GI symptoms: heartburn, indigestion, dyspepsia, abdominal pain, melena, BRBPR Course: gradual decline from 11.4 g/dL (05/2017) to 9.6 g/dL (10/27/18).  Relatively stable Hgb since 09/19/18 measure 9.5. Treatments prior to visits: none Relevant diagnostic tests: Colonoscopy reported by patient to Dr Su Hoff during [facility] ov as having had one in 2013 that pt reported as unremarkable. Pt recalls colonoscopy was performed across from [facility]  Recent 2019 FIT test was negative Risk Factors: Warfarin therapy  SH: smoking status reviewed  Review of Systems    see hpi Objective:   Physical Exam VS reviewed GEN: Alert, Cooperative, Groomed, NAD, sitting in wheel chair   Diabetic Foot Exam - Simple   Simple Foot Form Diabetic Foot exam was performed with the following findings:  Yes 10/27/2018 12:04 PM  Visual Inspection See comments:  Yes Sensation Testing Intact to touch and monofilament testing bilaterally:  Yes Pulse Check See comments:  Yes Comments Exfoliated skin in toe webs bilaterally.  All nails thick but  trimmed. Difficult to palpate pulses feet.         Assessment & Plan:

## 2018-10-28 NOTE — Telephone Encounter (Signed)
I informed Abigail Duncan about her iron deficiency anemia and vitamin B12 deficiency. She agreed to the plan of: - 1 OTC iron tablet (325) daily - 1 OTC Vitamin B12 tablet (1000 mcg) daily - referral to [facility] gastroenterology for evaluation of her IDA - RTC 4 - 6 weeks where will monitor response to interventions.

## 2018-10-28 NOTE — Assessment & Plan Note (Signed)
New problem Start Vitamin B12 supplement 1000 mcg daily oral Recheck level in several months

## 2018-11-30 ENCOUNTER — Encounter: Payer: Self-pay | Admitting: Gastroenterology

## 2018-12-01 ENCOUNTER — Ambulatory Visit: Payer: Medicare Other | Admitting: Physical Therapy

## 2018-12-04 ENCOUNTER — Other Ambulatory Visit: Payer: Self-pay | Admitting: Family Medicine

## 2018-12-08 ENCOUNTER — Ambulatory Visit: Payer: Medicare Other | Attending: Family Medicine | Admitting: Physical Therapy

## 2018-12-08 ENCOUNTER — Ambulatory Visit: Payer: Medicare Other | Admitting: Physical Therapy

## 2018-12-15 ENCOUNTER — Ambulatory Visit: Payer: Medicare Other | Admitting: Family Medicine

## 2018-12-17 ENCOUNTER — Other Ambulatory Visit: Payer: Self-pay | Admitting: Family Medicine

## 2018-12-21 ENCOUNTER — Other Ambulatory Visit: Payer: Self-pay | Admitting: Family Medicine

## 2018-12-21 DIAGNOSIS — E1142 Type 2 diabetes mellitus with diabetic polyneuropathy: Secondary | ICD-10-CM

## 2018-12-27 ENCOUNTER — Other Ambulatory Visit: Payer: Self-pay | Admitting: *Deleted

## 2018-12-27 ENCOUNTER — Other Ambulatory Visit: Payer: Self-pay | Admitting: Family Medicine

## 2018-12-27 DIAGNOSIS — M5136 Other intervertebral disc degeneration, lumbar region: Secondary | ICD-10-CM

## 2018-12-27 MED ORDER — HYDROCODONE-ACETAMINOPHEN 5-325 MG PO TABS: 1.0000 | ORAL_TABLET | Freq: Three times a day (TID) | ORAL | 0 refills | Status: AC | PRN

## 2018-12-27 MED ORDER — HYDROCODONE-ACETAMINOPHEN 5-325 MG PO TABS: 1.0000 | ORAL_TABLET | Freq: Three times a day (TID) | ORAL | 0 refills | Status: DC | PRN

## 2018-12-27 NOTE — Telephone Encounter (Signed)
Refills handled in an orders only encounter.  Jazmin Hartsell,CMA

## 2018-12-27 NOTE — Telephone Encounter (Signed)
Spoke with patient regarding her upcoming appointment and let her know that Dr. McDiarmid was cancelling next Thursday's patients due to the Covid19 spread.  Patient voiced understanding and states that she just needs a refill of her hydrocodone and also just wants to make sure she is ok to skip this month of her coumadin check.  Spoke with Abigail Duncan in the lab regarding this and patient has been very stable with her INR and will be fine to skip this check.  Will forward to MD for medication refill.  Henretter Piekarski,CMA

## 2018-12-30 ENCOUNTER — Telehealth: Payer: Self-pay

## 2018-12-30 NOTE — Telephone Encounter (Signed)
Office visit that was scheduled for 01-02-2019 has been moved to 02-02-2019. Due to the Covid-19. Dr Loletha Carrow has revived the chart and advised the appointment change to be appropriate.

## 2018-12-31 ENCOUNTER — Other Ambulatory Visit: Payer: Self-pay | Admitting: Interventional Cardiology

## 2019-01-02 ENCOUNTER — Ambulatory Visit: Payer: Medicare Other | Admitting: Gastroenterology

## 2019-01-04 ENCOUNTER — Other Ambulatory Visit: Payer: Self-pay | Admitting: Cardiology

## 2019-01-04 NOTE — Telephone Encounter (Signed)
Metolazone refill

## 2019-01-05 ENCOUNTER — Ambulatory Visit: Payer: Medicare Other | Admitting: Family Medicine

## 2019-01-17 ENCOUNTER — Other Ambulatory Visit: Payer: Self-pay | Admitting: Family Medicine

## 2019-01-17 DIAGNOSIS — E1142 Type 2 diabetes mellitus with diabetic polyneuropathy: Secondary | ICD-10-CM

## 2019-02-02 ENCOUNTER — Ambulatory Visit: Payer: Medicare Other | Admitting: Gastroenterology

## 2019-02-15 ENCOUNTER — Other Ambulatory Visit: Payer: Self-pay

## 2019-02-15 ENCOUNTER — Emergency Department (HOSPITAL_COMMUNITY): Payer: Medicare Other

## 2019-02-15 ENCOUNTER — Inpatient Hospital Stay (HOSPITAL_COMMUNITY)
Admission: EM | Admit: 2019-02-15 | Discharge: 2019-02-21 | DRG: 546 | Disposition: A | Discharge status: Skilled Nursing Facility | Payer: Medicare Other | Attending: Family Medicine | Admitting: Family Medicine

## 2019-02-15 DIAGNOSIS — G4733 Obstructive sleep apnea (adult) (pediatric): Secondary | ICD-10-CM | POA: Diagnosis not present

## 2019-02-15 DIAGNOSIS — Z79891 Long term (current) use of opiate analgesic: Secondary | ICD-10-CM

## 2019-02-15 DIAGNOSIS — I10 Essential (primary) hypertension: Secondary | ICD-10-CM | POA: Diagnosis not present

## 2019-02-15 DIAGNOSIS — M5431 Sciatica, right side: Secondary | ICD-10-CM | POA: Diagnosis present

## 2019-02-15 DIAGNOSIS — I878 Other specified disorders of veins: Secondary | ICD-10-CM | POA: Diagnosis present

## 2019-02-15 DIAGNOSIS — Z8249 Family history of ischemic heart disease and other diseases of the circulatory system: Secondary | ICD-10-CM

## 2019-02-15 DIAGNOSIS — R262 Difficulty in walking, not elsewhere classified: Secondary | ICD-10-CM | POA: Diagnosis not present

## 2019-02-15 DIAGNOSIS — I4891 Unspecified atrial fibrillation: Secondary | ICD-10-CM | POA: Diagnosis not present

## 2019-02-15 DIAGNOSIS — D638 Anemia in other chronic diseases classified elsewhere: Secondary | ICD-10-CM | POA: Diagnosis not present

## 2019-02-15 DIAGNOSIS — I272 Pulmonary hypertension, unspecified: Secondary | ICD-10-CM | POA: Diagnosis present

## 2019-02-15 DIAGNOSIS — Z888 Allergy status to other drugs, medicaments and biological substances status: Secondary | ICD-10-CM

## 2019-02-15 DIAGNOSIS — N184 Chronic kidney disease, stage 4 (severe): Secondary | ICD-10-CM | POA: Diagnosis present

## 2019-02-15 DIAGNOSIS — Z751 Person awaiting admission to adequate facility elsewhere: Secondary | ICD-10-CM

## 2019-02-15 DIAGNOSIS — B9620 Unspecified Escherichia coli [E. coli] as the cause of diseases classified elsewhere: Secondary | ICD-10-CM | POA: Diagnosis present

## 2019-02-15 DIAGNOSIS — R0602 Shortness of breath: Secondary | ICD-10-CM | POA: Diagnosis not present

## 2019-02-15 DIAGNOSIS — E11319 Type 2 diabetes mellitus with unspecified diabetic retinopathy without macular edema: Secondary | ICD-10-CM | POA: Diagnosis not present

## 2019-02-15 DIAGNOSIS — E559 Vitamin D deficiency, unspecified: Secondary | ICD-10-CM | POA: Diagnosis not present

## 2019-02-15 DIAGNOSIS — I4821 Permanent atrial fibrillation: Secondary | ICD-10-CM | POA: Diagnosis not present

## 2019-02-15 DIAGNOSIS — Z1159 Encounter for screening for other viral diseases: Secondary | ICD-10-CM

## 2019-02-15 DIAGNOSIS — G894 Chronic pain syndrome: Secondary | ICD-10-CM | POA: Diagnosis present

## 2019-02-15 DIAGNOSIS — E1142 Type 2 diabetes mellitus with diabetic polyneuropathy: Secondary | ICD-10-CM | POA: Diagnosis present

## 2019-02-15 DIAGNOSIS — M353 Polymyalgia rheumatica: Principal | ICD-10-CM

## 2019-02-15 DIAGNOSIS — I959 Hypotension, unspecified: Secondary | ICD-10-CM | POA: Diagnosis present

## 2019-02-15 DIAGNOSIS — D509 Iron deficiency anemia, unspecified: Secondary | ICD-10-CM | POA: Diagnosis present

## 2019-02-15 DIAGNOSIS — R32 Unspecified urinary incontinence: Secondary | ICD-10-CM | POA: Diagnosis present

## 2019-02-15 DIAGNOSIS — R5383 Other fatigue: Secondary | ICD-10-CM | POA: Diagnosis not present

## 2019-02-15 DIAGNOSIS — Z9861 Coronary angioplasty status: Secondary | ICD-10-CM

## 2019-02-15 DIAGNOSIS — R609 Edema, unspecified: Secondary | ICD-10-CM | POA: Diagnosis not present

## 2019-02-15 DIAGNOSIS — J984 Other disorders of lung: Secondary | ICD-10-CM | POA: Diagnosis not present

## 2019-02-15 DIAGNOSIS — N390 Urinary tract infection, site not specified: Secondary | ICD-10-CM

## 2019-02-15 DIAGNOSIS — Z79899 Other long term (current) drug therapy: Secondary | ICD-10-CM

## 2019-02-15 DIAGNOSIS — K5900 Constipation, unspecified: Secondary | ICD-10-CM | POA: Diagnosis not present

## 2019-02-15 DIAGNOSIS — E538 Deficiency of other specified B group vitamins: Secondary | ICD-10-CM | POA: Diagnosis present

## 2019-02-15 DIAGNOSIS — Z7401 Bed confinement status: Secondary | ICD-10-CM | POA: Diagnosis not present

## 2019-02-15 DIAGNOSIS — N2581 Secondary hyperparathyroidism of renal origin: Secondary | ICD-10-CM | POA: Diagnosis not present

## 2019-02-15 DIAGNOSIS — Z7984 Long term (current) use of oral hypoglycemic drugs: Secondary | ICD-10-CM

## 2019-02-15 DIAGNOSIS — R531 Weakness: Secondary | ICD-10-CM | POA: Diagnosis not present

## 2019-02-15 DIAGNOSIS — E1122 Type 2 diabetes mellitus with diabetic chronic kidney disease: Secondary | ICD-10-CM | POA: Diagnosis present

## 2019-02-15 DIAGNOSIS — Z800 Family history of malignant neoplasm of digestive organs: Secondary | ICD-10-CM

## 2019-02-15 DIAGNOSIS — E1149 Type 2 diabetes mellitus with other diabetic neurological complication: Secondary | ICD-10-CM | POA: Diagnosis present

## 2019-02-15 DIAGNOSIS — E7800 Pure hypercholesterolemia, unspecified: Secondary | ICD-10-CM | POA: Diagnosis not present

## 2019-02-15 DIAGNOSIS — Z803 Family history of malignant neoplasm of breast: Secondary | ICD-10-CM

## 2019-02-15 DIAGNOSIS — Z833 Family history of diabetes mellitus: Secondary | ICD-10-CM

## 2019-02-15 DIAGNOSIS — M8580 Other specified disorders of bone density and structure, unspecified site: Secondary | ICD-10-CM | POA: Diagnosis present

## 2019-02-15 DIAGNOSIS — Z823 Family history of stroke: Secondary | ICD-10-CM

## 2019-02-15 DIAGNOSIS — D649 Anemia, unspecified: Secondary | ICD-10-CM | POA: Diagnosis not present

## 2019-02-15 DIAGNOSIS — E119 Type 2 diabetes mellitus without complications: Secondary | ICD-10-CM | POA: Diagnosis not present

## 2019-02-15 DIAGNOSIS — Z6841 Body Mass Index (BMI) 40.0 and over, adult: Secondary | ICD-10-CM | POA: Diagnosis not present

## 2019-02-15 DIAGNOSIS — M2550 Pain in unspecified joint: Secondary | ICD-10-CM

## 2019-02-15 DIAGNOSIS — R700 Elevated erythrocyte sedimentation rate: Secondary | ICD-10-CM | POA: Diagnosis not present

## 2019-02-15 DIAGNOSIS — E785 Hyperlipidemia, unspecified: Secondary | ICD-10-CM | POA: Diagnosis present

## 2019-02-15 DIAGNOSIS — Z7901 Long term (current) use of anticoagulants: Secondary | ICD-10-CM

## 2019-02-15 DIAGNOSIS — I2510 Atherosclerotic heart disease of native coronary artery without angina pectoris: Secondary | ICD-10-CM | POA: Diagnosis present

## 2019-02-15 DIAGNOSIS — Z801 Family history of malignant neoplasm of trachea, bronchus and lung: Secondary | ICD-10-CM

## 2019-02-15 DIAGNOSIS — G2581 Restless legs syndrome: Secondary | ICD-10-CM | POA: Diagnosis present

## 2019-02-15 DIAGNOSIS — J309 Allergic rhinitis, unspecified: Secondary | ICD-10-CM | POA: Diagnosis not present

## 2019-02-15 DIAGNOSIS — I129 Hypertensive chronic kidney disease with stage 1 through stage 4 chronic kidney disease, or unspecified chronic kidney disease: Secondary | ICD-10-CM | POA: Diagnosis not present

## 2019-02-15 DIAGNOSIS — I509 Heart failure, unspecified: Secondary | ICD-10-CM | POA: Diagnosis not present

## 2019-02-15 DIAGNOSIS — Z87442 Personal history of urinary calculi: Secondary | ICD-10-CM

## 2019-02-15 DIAGNOSIS — M6281 Muscle weakness (generalized): Secondary | ICD-10-CM | POA: Diagnosis not present

## 2019-02-15 LAB — COMPREHENSIVE METABOLIC PANEL WITH GFR
ALT: 15 U/L (ref 0–44)
AST: 20 U/L (ref 15–41)
Albumin: 2.9 g/dL — ABNORMAL LOW (ref 3.5–5.0)
Alkaline Phosphatase: 57 U/L (ref 38–126)
Anion gap: 19 — ABNORMAL HIGH (ref 5–15)
BUN: 49 mg/dL — ABNORMAL HIGH (ref 8–23)
CO2: 21 mmol/L — ABNORMAL LOW (ref 22–32)
Calcium: 9.2 mg/dL (ref 8.9–10.3)
Chloride: 95 mmol/L — ABNORMAL LOW (ref 98–111)
Creatinine, Ser: 2.06 mg/dL — ABNORMAL HIGH (ref 0.44–1.00)
GFR calc Af Amer: 27 mL/min — ABNORMAL LOW
GFR calc non Af Amer: 23 mL/min — ABNORMAL LOW
Glucose, Bld: 167 mg/dL — ABNORMAL HIGH (ref 70–99)
Potassium: 4.5 mmol/L (ref 3.5–5.1)
Sodium: 135 mmol/L (ref 135–145)
Total Bilirubin: 0.5 mg/dL (ref 0.3–1.2)
Total Protein: 6.9 g/dL (ref 6.5–8.1)

## 2019-02-15 LAB — LACTIC ACID, PLASMA
Lactic Acid, Venous: 2 mmol/L (ref 0.5–1.9)
Lactic Acid, Venous: 1.5 mmol/L (ref 0.5–1.9)

## 2019-02-15 LAB — CBC WITH DIFFERENTIAL/PLATELET
Abs Immature Granulocytes: 0.09 10*3/uL — ABNORMAL HIGH (ref 0.00–0.07)
Basophils Absolute: 0 10*3/uL (ref 0.0–0.1)
Basophils Relative: 0 %
Eosinophils Absolute: 0.2 10*3/uL (ref 0.0–0.5)
Eosinophils Relative: 2 %
HCT: 35.5 % — ABNORMAL LOW (ref 36.0–46.0)
Hemoglobin: 10.6 g/dL — ABNORMAL LOW (ref 12.0–15.0)
Immature Granulocytes: 1 %
Lymphocytes Relative: 12 %
Lymphs Abs: 1.5 10*3/uL (ref 0.7–4.0)
MCH: 25.4 pg — ABNORMAL LOW (ref 26.0–34.0)
MCHC: 29.9 g/dL — ABNORMAL LOW (ref 30.0–36.0)
MCV: 85.1 fL (ref 80.0–100.0)
Monocytes Absolute: 0.8 10*3/uL (ref 0.1–1.0)
Monocytes Relative: 6 %
Neutro Abs: 10.3 10*3/uL — ABNORMAL HIGH (ref 1.7–7.7)
Neutrophils Relative %: 79 %
Platelets: 422 10*3/uL — ABNORMAL HIGH (ref 150–400)
RBC: 4.17 MIL/uL (ref 3.87–5.11)
RDW: 17.2 % — ABNORMAL HIGH (ref 11.5–15.5)
WBC: 12.9 10*3/uL — ABNORMAL HIGH (ref 4.0–10.5)
nRBC: 0 % (ref 0.0–0.2)

## 2019-02-15 LAB — URINALYSIS, ROUTINE W REFLEX MICROSCOPIC
Bilirubin Urine: NEGATIVE
Glucose, UA: NEGATIVE mg/dL
Ketones, ur: NEGATIVE mg/dL
Nitrite: POSITIVE — AB
Protein, ur: NEGATIVE mg/dL
Specific Gravity, Urine: 1.015 (ref 1.005–1.030)
pH: 5.5 (ref 5.0–8.0)

## 2019-02-15 LAB — GLUCOSE, CAPILLARY: Glucose-Capillary: 161 mg/dL — ABNORMAL HIGH (ref 70–99)

## 2019-02-15 LAB — TROPONIN I: Troponin I: <0.03 ng/mL (ref <0.03)

## 2019-02-15 LAB — SEDIMENTATION RATE: Sed Rate: 75 mm/h — ABNORMAL HIGH (ref 0–22)

## 2019-02-15 LAB — URINALYSIS, MICROSCOPIC (REFLEX)

## 2019-02-15 LAB — C-REACTIVE PROTEIN: CRP: 12.6 mg/dL — ABNORMAL HIGH

## 2019-02-15 LAB — PROTIME-INR
INR: 2.4 — ABNORMAL HIGH (ref 0.8–1.2)
INR: 2.5 — ABNORMAL HIGH (ref 0.8–1.2)
Prothrombin Time: 25.7 s — ABNORMAL HIGH (ref 11.4–15.2)
Prothrombin Time: 26.4 seconds — ABNORMAL HIGH (ref 11.4–15.2)

## 2019-02-15 LAB — SARS CORONAVIRUS 2 BY RT PCR (HOSPITAL ORDER, PERFORMED IN ~~LOC~~ HOSPITAL LAB): SARS Coronavirus 2: NEGATIVE

## 2019-02-15 LAB — CK: Total CK: 189 U/L (ref 38–234)

## 2019-02-15 MED ORDER — SODIUM CHLORIDE 0.9 % IV SOLN
1.0000 g | INTRAVENOUS | Status: DC
  Administered 2019-02-16: 1 g via INTRAVENOUS
  Filled 2019-02-15: qty 10

## 2019-02-15 MED ORDER — HYDROCODONE-ACETAMINOPHEN 5-325 MG PO TABS
1.0000 | ORAL_TABLET | Freq: Three times a day (TID) | ORAL | Status: DC | PRN
  Administered 2019-02-15 – 2019-02-21 (×14): 1 via ORAL
  Filled 2019-02-15 (×14): qty 1

## 2019-02-15 MED ORDER — POTASSIUM CHLORIDE CRYS ER 20 MEQ PO TBCR
20.0000 meq | EXTENDED_RELEASE_TABLET | Freq: Every day | ORAL | Status: DC
  Administered 2019-02-16 – 2019-02-21 (×6): 20 meq via ORAL
  Filled 2019-02-15 (×6): qty 1

## 2019-02-15 MED ORDER — SODIUM CHLORIDE 0.9 % IV BOLUS
500.0000 mL | Freq: Once | INTRAVENOUS | Status: AC
  Administered 2019-02-15: 500 mL via INTRAVENOUS

## 2019-02-15 MED ORDER — WARFARIN - PHARMACIST DOSING INPATIENT: Freq: Every day | Status: DC

## 2019-02-15 MED ORDER — INSULIN ASPART 100 UNIT/ML ~~LOC~~ SOLN
0.0000 [IU] | Freq: Three times a day (TID) | SUBCUTANEOUS | Status: DC
  Administered 2019-02-16: 3 [IU] via SUBCUTANEOUS
  Administered 2019-02-16: 2 [IU] via SUBCUTANEOUS
  Administered 2019-02-16: 3 [IU] via SUBCUTANEOUS
  Administered 2019-02-17 (×2): 2 [IU] via SUBCUTANEOUS
  Administered 2019-02-17: 5 [IU] via SUBCUTANEOUS
  Administered 2019-02-18: 7 [IU] via SUBCUTANEOUS
  Administered 2019-02-18 (×2): 5 [IU] via SUBCUTANEOUS
  Administered 2019-02-19: 3 [IU] via SUBCUTANEOUS
  Administered 2019-02-19: 7 [IU] via SUBCUTANEOUS
  Administered 2019-02-19: 9 [IU] via SUBCUTANEOUS
  Administered 2019-02-20: 3 [IU] via SUBCUTANEOUS
  Administered 2019-02-20: 2 [IU] via SUBCUTANEOUS
  Administered 2019-02-20 – 2019-02-21 (×2): 5 [IU] via SUBCUTANEOUS
  Administered 2019-02-21: 2 [IU] via SUBCUTANEOUS

## 2019-02-15 MED ORDER — FERROUS SULFATE 325 (65 FE) MG PO TABS
325.0000 mg | ORAL_TABLET | Freq: Three times a day (TID) | ORAL | Status: DC
  Administered 2019-02-16 – 2019-02-21 (×18): 325 mg via ORAL
  Filled 2019-02-15 (×17): qty 1

## 2019-02-15 MED ORDER — HYDROCODONE-ACETAMINOPHEN 5-325 MG PO TABS
1.0000 | ORAL_TABLET | Freq: Once | ORAL | Status: AC
  Administered 2019-02-15: 1 via ORAL
  Filled 2019-02-15: qty 1

## 2019-02-15 MED ORDER — SODIUM CHLORIDE 0.9 % IV SOLN
1.0000 g | Freq: Once | INTRAVENOUS | Status: AC
  Administered 2019-02-15: 1 g via INTRAVENOUS
  Filled 2019-02-15: qty 10

## 2019-02-15 MED ORDER — GABAPENTIN 300 MG PO CAPS
300.0000 mg | ORAL_CAPSULE | Freq: Three times a day (TID) | ORAL | Status: DC
  Administered 2019-02-15 – 2019-02-21 (×18): 300 mg via ORAL
  Filled 2019-02-15 (×18): qty 1

## 2019-02-15 MED ORDER — POLYETHYLENE GLYCOL 3350 17 G PO PACK
17.0000 g | PACK | Freq: Every day | ORAL | Status: DC | PRN
  Administered 2019-02-18 – 2019-02-19 (×2): 17 g via ORAL
  Filled 2019-02-15 (×2): qty 1

## 2019-02-15 MED ORDER — METOPROLOL TARTRATE 25 MG PO TABS
100.0000 mg | ORAL_TABLET | Freq: Once | ORAL | Status: AC
  Administered 2019-02-15: 100 mg via ORAL
  Filled 2019-02-15: qty 4

## 2019-02-15 MED ORDER — ROSUVASTATIN CALCIUM 5 MG PO TABS
10.0000 mg | ORAL_TABLET | Freq: Every day | ORAL | Status: DC
  Administered 2019-02-16 – 2019-02-21 (×6): 10 mg via ORAL
  Filled 2019-02-15 (×6): qty 2

## 2019-02-15 MED ORDER — VITAMIN B-12 1000 MCG PO TABS
1000.0000 ug | ORAL_TABLET | Freq: Every day | ORAL | Status: DC
  Administered 2019-02-16 – 2019-02-21 (×6): 1000 ug via ORAL
  Filled 2019-02-15 (×6): qty 1

## 2019-02-15 MED ORDER — METOPROLOL TARTRATE 100 MG PO TABS
100.0000 mg | ORAL_TABLET | Freq: Two times a day (BID) | ORAL | Status: DC
  Administered 2019-02-15 – 2019-02-21 (×12): 100 mg via ORAL
  Filled 2019-02-15 (×12): qty 1

## 2019-02-15 MED ORDER — FUROSEMIDE 40 MG PO TABS
40.0000 mg | ORAL_TABLET | Freq: Every day | ORAL | Status: DC
  Administered 2019-02-16 – 2019-02-21 (×6): 40 mg via ORAL
  Filled 2019-02-15 (×6): qty 1

## 2019-02-15 MED ORDER — MORPHINE SULFATE (PF) 4 MG/ML IV SOLN
4.0000 mg | Freq: Once | INTRAVENOUS | Status: AC
  Administered 2019-02-15: 4 mg via INTRAVENOUS
  Filled 2019-02-15: qty 1

## 2019-02-15 MED ORDER — WARFARIN SODIUM 5 MG PO TABS
5.0000 mg | ORAL_TABLET | Freq: Once | ORAL | Status: AC
  Administered 2019-02-15: 5 mg via ORAL
  Filled 2019-02-15 (×2): qty 1

## 2019-02-15 NOTE — ED Provider Notes (Signed)
Patient still feels weak.  Given this is an acute on subacute problem with the acute UTI, transient hypotension, and difficulty securing 24-hour home care which I think she probably needs, I think would be better to admit, take care of the UTI and other problems, and possibly she may need rehab.  Family practice will admit.   Sherwood Gambler, MD 02/15/19 [phone]

## 2019-02-15 NOTE — ED Notes (Signed)
Attempted report 

## 2019-02-15 NOTE — ED Notes (Signed)
ED TO INPATIENT HANDOFF REPORT  ED Nurse Name and Phone #:  Elmyra Ricks [phone]  S Name/Age/Gender Abigail Duncan 78 y.o. female Room/Bed: 028C/028C  Code Status   Code Status: Partial Code  Home/SNF/Other Home Patient oriented to: self, place, time and situation Is this baseline? Yes   Triage Complete: Triage complete  Chief Complaint weakness  Triage Note Pt BIB [facility]. Per EMS patient has had increased generalized pain all over for the past few weeks. Per EMS patient's son reports he has been unable to get the patient out of bed for the past one to two days. Pt complaining of 10/10 generalized pain at present time Pt denies any fevers cough at present time.    Allergies Allergies  Allergen Reactions  . Lisinopril     REACTION: bad dreams    Level of Care/Admitting Diagnosis ED Disposition    ED Disposition Condition [facility] Area: [place] [[id number]]  Level of Care: Telemetry Medical [621]  Covid Evaluation: N/A  Diagnosis: Generalized weakness [308657]  Admitting Physician: Kathrene Alu [[id number]]  Attending Physician: Zenia Resides [[id number]]  Estimated length of stay: past midnight tomorrow  Certification:: I certify this patient will need inpatient services for at least 2 midnights  PT Class (Do Not Modify): Inpatient [101]  PT Acc Code (Do Not Modify): Private [1]       B Medical/Surgery History Past Medical History:  Diagnosis Date  . Chronic pain syndrome 07/15/2018  . Degenerative arthritis of knee 04/2001   by xray  . Diabetic peripheral neuropathy ([place]) 10/23/2013  . Diabetic peripheral neuropathy ([place]) 10/23/2013  . Impaired mobility 05/21/2017  . Kidney stone on right side 05/12/2016   [place] 05/2016 - non-obstructing right 6 mm stone  . Obstructive sleep apnea 05/19/2018   Sleep study showed some apnea and nighttime desaturations. However, did not meet criteria for CPAP.  [place] Kitchen Right knee pain 07/23/2014  . Sciatica  of left side 12/2003   DDD L5,S1   . Secondary hyperparathyroidism ([place]) 05/31/2017  . Spinal stenosis, lumbar 04/2004   mild on MRI at L 4-5   Past Surgical History:  Procedure Laterality Date  . benign breast tumor     Excised 40+ years ago  . COLPOSCOPY  03/1991   dysplasia  . PTCA (aka ANGIOPLASTY)  07/1993   RCA     A IV Location/Drains/Wounds Patient Lines/Drains/Airways Status   Active Line/Drains/Airways    Name:   Placement date:   Placement time:   Site:   Days:   Peripheral IV 02/15/19 Right;Anterior;Upper Forearm   02/15/19    1227    Forearm   less than 1          Intake/Output Last 24 hours  Intake/Output Summary (Last 24 hours) at 02/15/2019 1900 Last data filed at 02/15/2019 1639 Gross per 24 hour  Intake 603.22 ml  Output -  Net 603.22 ml    Labs/Imaging Results for orders placed or performed during the hospital encounter of 02/15/19 (from the past 48 hour(s))  Lactic acid, plasma     Status: Abnormal   Collection Time: 02/15/19 10:27 AM  Result Value Ref Range   Lactic Acid, Venous 2.0 (HH) 0.5 - 1.9 mmol/L    Comment: CRITICAL RESULT CALLED TO, READ BACK BY AND VERIFIED WITH: K COBB RN [id number] BY A BENNETT Performed at [facility], [address], [place], [place] [address]   Comprehensive metabolic panel  Status: Abnormal   Collection Time: 02/15/19 10:27 AM  Result Value Ref Range   Sodium 135 135 - 145 mmol/L   Potassium 4.5 3.5 - 5.1 mmol/L   Chloride 95 (L) 98 - 111 mmol/L   CO2 21 (L) 22 - 32 mmol/L   Glucose, Bld 167 (H) 70 - 99 mg/dL   BUN 49 (H) 8 - 23 mg/dL   Creatinine, Ser 2.06 (H) 0.44 - 1.00 mg/dL   Calcium 9.2 8.9 - 10.3 mg/dL   Total Protein 6.9 6.5 - 8.1 g/dL   Albumin 2.9 (L) 3.5 - 5.0 g/dL   AST 20 15 - 41 U/L   ALT 15 0 - 44 U/L   Alkaline Phosphatase 57 38 - 126 U/L   Total Bilirubin 0.5 0.3 - 1.2 mg/dL   GFR calc non Af Amer 23 (L) >60 mL/min   GFR calc Af Amer 27 (L) >60 mL/min   Anion gap 19 (H) 5 -  15    Comment: Performed at [facility], [place] [address] [place], [place] [address]  Troponin I - Once     Status: None   Collection Time: 02/15/19 10:27 AM  Result Value Ref Range   Troponin I <0.03 <0.03 ng/mL    Comment: Performed at [facility] [address] [place], [place] [address]  CBC with Differential     Status: Abnormal   Collection Time: 02/15/19 10:27 AM  Result Value Ref Range   WBC 12.9 (H) 4.0 - 10.5 K/uL   RBC 4.17 3.87 - 5.11 MIL/uL   Hemoglobin 10.6 (L) 12.0 - 15.0 g/dL   HCT 35.5 (L) 36.0 - 46.0 %   MCV 85.1 80.0 - 100.0 fL   MCH 25.4 (L) 26.0 - 34.0 pg   MCHC 29.9 (L) 30.0 - 36.0 g/dL   RDW 17.2 (H) 11.5 - 15.5 %   Platelets 422 (H) 150 - 400 K/uL   nRBC 0.0 0.0 - 0.2 %   Neutrophils Relative % 79 %   Neutro Abs 10.3 (H) 1.7 - 7.7 K/uL   Lymphocytes Relative 12 %   Lymphs Abs 1.5 0.7 - 4.0 K/uL   Monocytes Relative 6 %   Monocytes Absolute 0.8 0.1 - 1.0 K/uL   Eosinophils Relative 2 %   Eosinophils Absolute 0.2 0.0 - 0.5 K/uL   Basophils Relative 0 %   Basophils Absolute 0.0 0.0 - 0.1 K/uL   Immature Granulocytes 1 %   Abs Immature Granulocytes 0.09 (H) 0.00 - 0.07 K/uL    Comment: Performed at [facility] [address] [place], [place] [address]  Protime-INR     Status: Abnormal   Collection Time: 02/15/19 10:27 AM  Result Value Ref Range   Prothrombin Time 25.7 (H) 11.4 - 15.2 seconds   INR 2.4 (H) 0.8 - 1.2    Comment: (NOTE) INR goal varies based on device and disease states. Performed at [facility], [place] [address] [place], [place] [address]   CK     Status: None   Collection Time: 02/15/19 10:27 AM  Result Value Ref Range   Total CK 189 38 - 234 U/L    Comment: Performed at [facility], [place] [address], [place], [place] [address]  Sedimentation rate     Status: Abnormal   Collection Time: 02/15/19 10:27 AM  Result Value Ref Range   Sed Rate 75 (H) 0 - 22 mm/hr    Comment: Performed  at [facility], [place] [address], [place], [place] [address]  C-reactive protein     Status: Abnormal   Collection Time: 02/15/19 10:27 AM  Result Value Ref Range   CRP 12.6 (H) <1.0 mg/dL    Comment: Performed at [facility] [address], [place], [place] [address]  Urinalysis, Routine w reflex microscopic     Status: Abnormal   Collection Time: 02/15/19 10:28 AM  Result Value Ref Range   Color, Urine YELLOW YELLOW   APPearance TURBID (A) CLEAR   Specific Gravity, Urine 1.015 1.005 - 1.030   pH 5.5 5.0 - 8.0   Glucose, UA NEGATIVE NEGATIVE mg/dL   Hgb urine dipstick TRACE (A) NEGATIVE   Bilirubin Urine NEGATIVE NEGATIVE   Ketones, ur NEGATIVE NEGATIVE mg/dL   Protein, ur NEGATIVE NEGATIVE mg/dL   Nitrite POSITIVE (A) NEGATIVE   Leukocytes,Ua SMALL (A) NEGATIVE    Comment: Performed at [facility] [address] [place], [place] [address]  Urinalysis, Microscopic (reflex)     Status: Abnormal   Collection Time: 02/15/19 10:28 AM  Result Value Ref Range   RBC / HPF 0-5 0 - 5 RBC/hpf   WBC, UA 11-20 0 - 5 WBC/hpf   Bacteria, UA MANY (A) NONE SEEN   Squamous Epithelial / LPF 0-5 0 - 5    Comment: Performed at [facility], [place] [address], [place], [place] [address]  SARS Coronavirus 2 (CEPHEID - Performed in [facility] hospital lab), Hosp Order     Status: None   Collection Time: 02/15/19 11:20 AM  Result Value Ref Range   SARS Coronavirus 2 NEGATIVE NEGATIVE    Comment: (NOTE) If result is NEGATIVE SARS-CoV-2 target nucleic acids are NOT DETECTED. The SARS-CoV-2 RNA is generally detectable in upper and lower  respiratory specimens during the acute phase of infection. The lowest  concentration of SARS-CoV-2 viral copies this assay can detect is 250  copies / mL. A negative result does not preclude SARS-CoV-2 infection  and should not be used as the sole basis for treatment or other  patient management decisions.  A negative result may occur with  improper  specimen collection / handling, submission of specimen other  than nasopharyngeal swab, presence of viral mutation(s) within the  areas targeted by this assay, and inadequate number of viral copies  (<250 copies / mL). A negative result must be combined with clinical  observations, patient history, and epidemiological information. If result is POSITIVE SARS-CoV-2 target nucleic acids are DETECTED. The SARS-CoV-2 RNA is generally detectable in upper and lower  respiratory specimens dur ing the acute phase of infection.  Positive  results are indicative of active infection with SARS-CoV-2.  Clinical  correlation with patient history and other diagnostic information is  necessary to determine patient infection status.  Positive results do  not rule out bacterial infection or co-infection with other viruses. If result is PRESUMPTIVE POSTIVE SARS-CoV-2 nucleic acids MAY BE PRESENT.   A presumptive positive result was obtained on the submitted specimen  and confirmed on repeat testing.  While 2019 novel coronavirus  (SARS-CoV-2) nucleic acids may be present in the submitted sample  additional confirmatory testing may be necessary for epidemiological  and / or clinical management purposes  to differentiate between  SARS-CoV-2 and other Sarbecovirus currently known to infect humans.  If clinically indicated additional testing with an alternate test  methodology [phone]) is advised. The SARS-CoV-2 RNA is generally  detectable  in upper and lower respiratory sp ecimens during the acute  phase of infection. The expected result is Negative. Fact Sheet for Patients:  StrictlyIdeas.no Fact Sheet for Healthcare Providers: BankingDealers.co.za This test is not yet approved or cleared by the [place] FDA and has been authorized for detection and/or diagnosis of SARS-CoV-2 by FDA under an Emergency Use Authorization (EUA).  This EUA will remain in  effect (meaning this test can be used) for the duration of the COVID-19 declaration under Section 564(b)(1) of the Act, 21 U.S.C. section 360bbb-3(b)(1), unless the authorization is terminated or revoked sooner. Performed at [facility], [place] [address] [place], [place] [address]   Lactic acid, plasma     Status: None   Collection Time: 02/15/19  1:45 PM  Result Value Ref Range   Lactic Acid, Venous 1.5 0.5 - 1.9 mmol/L    Comment: Performed at [facility] [address], [place], [place] [address]   Dg Chest Port 1 View  Result Date: 02/15/2019 CLINICAL DATA:  78 year old female EXAM: PORTABLE CHEST 1 VIEW COMPARISON:  03/02/2017 FINDINGS: Cardiomediastinal silhouette unchanged in size and contour. Calcifications of the aortic arch. Similar appearance of coarsened interstitial markings of the bilateral lungs. No pneumothorax. No pleural effusion. No new confluent airspace disease.  No displaced fracture IMPRESSION: Similar appearance of chronic lung changes without evidence of acute cardiopulmonary disease Electronically Signed   By: Corrie Mckusick D.O.   On: 02/15/2019 11:04    Pending Labs Unresulted Labs (From admission, onward)    Start     Ordered   02/16/19 0109  Basic metabolic panel  Tomorrow morning,   R     02/15/19 1836   02/16/19 0500  CBC with Differential/Platelet  Tomorrow morning,   R     02/15/19 1836   02/15/19 1854  Protime-INR  Once,   R     02/15/19 1854   02/15/19 1835  Culture, blood (routine x 2)  BLOOD CULTURE X 2,   R     02/15/19 1836   02/15/19 1028  Urine culture  ONCE - STAT,   STAT    Question:  Patient immune status  Answer:  Normal   02/15/19 1028          Vitals/Pain Today's Vitals   02/15/19 1445 02/15/19 1630 02/15/19 1700 02/15/19 1800  BP: 111/76 130/71 137/88 (!) 120/97  Pulse: (!) 106 (!) 106 (!) 107 (!) 118  Resp: (!) 25 (!) 25 19 (!) 23  Temp:      TempSrc:      SpO2: 95% 93% 94% (!) 85%  Weight:      Height:       PainSc:        Isolation Precautions No active isolations  Medications Medications  HYDROcodone-acetaminophen (NORCO/VICODIN) 5-325 MG per tablet 1 tablet (has no administration in time range)  furosemide (LASIX) tablet 40 mg (has no administration in time range)  metoprolol tartrate (LOPRESSOR) tablet 100 mg (has no administration in time range)  rosuvastatin (CRESTOR) tablet 10 mg (has no administration in time range)  ferrous sulfate tablet 325 mg (has no administration in time range)  vitamin B-12 (CYANOCOBALAMIN) tablet 1,000 mcg (has no administration in time range)  potassium chloride SA (K-DUR) CR tablet 20 mEq (has no administration in time range)  gabapentin (NEURONTIN) capsule 300 mg (has no administration in time range)  polyethylene glycol (MIRALAX / GLYCOLAX) packet 17 g (has no administration in time range)  cefTRIAXone (ROCEPHIN) 1 g in sodium chloride 0.9 % 100 mL IVPB (has no administration in time range)  insulin aspart (novoLOG) injection 0-9 Units (has no administration in time range)  warfarin (COUMADIN) tablet 5 mg (has no administration in time range)  Warfarin - Pharmacist Dosing Inpatient (has no administration in time range)  morphine 4 MG/ML injection 4 mg (4 mg Intravenous Given 02/15/19 1234)  metoprolol tartrate (LOPRESSOR) tablet 100 mg (100 mg Oral Given 02/15/19 1107)  HYDROcodone-acetaminophen (NORCO/VICODIN) 5-325 MG per tablet 1 tablet (1 tablet Oral Given 02/15/19 1149)  sodium chloride 0.9 % bolus 500 mL (0 mLs Intravenous Stopped 02/15/19 1639)  cefTRIAXone (ROCEPHIN) 1 g in sodium chloride 0.9 % 100 mL IVPB (0 g Intravenous Stopped 02/15/19 1638)    Mobility non-ambulatory High fall risk   Focused Assessments Neuro Assessment Handoff:  Swallow screen pass? Yes  Cardiac Rhythm: Atrial fibrillation       Neuro Assessment:   Neuro Checks:      Last Documented NIHSS Modified Score:   Has TPA been given? No If patient is a Neuro Trauma and  patient is going to OR before floor call report to [place] nurse: [phone] or [phone]     R Recommendations: See Admitting Provider Note  Report given to:   Additional Notes:

## 2019-02-15 NOTE — H&P (Signed)
[facility] Admission History and Physical Service Pager: [phone]  Patient name: Abigail Duncan Medical record number: [id number] Date of birth: 23-Oct-1940 Age: 78 y.o. Gender: female  Primary Care Provider: McDiarmid, Blane Ohara, MD Consultants: None Code Status: Partial Emergency contact: Abigail Duncan, son, [phone] or [phone]  Chief Complaint: Generalized pain and weakness  Assessment and Plan: Abigail Duncan is a 78 y.o. female presenting with generalized pain and weakness, found to have likely have a UTI. PMH is significant for anemia, hypertension, atrial fibrillation, CKD stage 4, chronic pain syndrome, type 2 diabetes, HLD, morbid obesity.  Generalized pain and weakness: Possibly due to UTI and concomitant chronic conditions.  Patient reports urinary symptoms such as urinary frequency and urgency as well as malodorous urine and says that she has had difficulties getting up from a seated or supine position for the last 2 weeks.  Vitals are notable for tachycardia, although patient has known atrial fibrillation.  EKG is also notable for atrial fibrillation with an increased rate.  UA significant for positive nitrites and small leukocytes with trace hemoglobin and many WBCs and bacteria found on urine micro.  Interestingly, her ESR was elevated to 75, and her CRP was elevated to 12.6.  Creatinine is somewhat elevated to 2.06, although baseline appears to be at around 1.8.  She has a leukocytosis to 12.9 with neutrophilia and a hemoglobin of 10.6, which appears to be around her baseline.  Patient likely has an uncomplicated UTI or pyelonephritis or even bacteremia that could be causing an exacerbation of her chronic pain and weakness, although she is afebrile.  She also needs evaluation by therapy for possible placement at a skilled nursing facility, since her son is having difficulty caring for her at home. -Admit to medical telemetry, attending Dr.  Andria Duncan -Ambulate with assistance -PT/OT evaluation -Continue ceftriaxone 1 g daily for UTI/pyelonephritis -Follow urine and blood cultures and transition to oral antibiotics pending these results  Atrial fibrillation: Patient takes warfarin at home, INR is 2.4 on admission.  She also takes Lopressor 100 mg twice daily. -Warfarin dosing by pharmacy -Continue Lopressor  Hypertension: Normotensive while in the ED.  Takes enalapril and Lopressor as above at home. -Hold enalapril due to slightly elevated creatinine, can add back if necessary  Type 2 diabetes: Last A1c was on 12/19 and was 7.8.  Patient takes metformin at home. -Hold metformin, start sensitive sliding scale  Chronic pain syndrome: Likely due to osteoarthritis and morbid obesity.  Patient takes Norco 3 times daily PRN. -Continue Norco 3 times daily as needed  Pedal edema: Patient does not have significant heart failure according to an echo from 2/18, but she does have pulmonary hypertension.  She may also have pedal edema from chronic venous stasis due to her age and obesity.  She takes Lasix 40 mg daily and metolazone 2.5 mg every other day.  Patient does feel that she has had a little more fluid in her legs lately. -Consider echo; BNP was not obtained due to its likely an accuracy due to patient's CKD and obesity -Continue Lasix 40 mg daily -Continue potassium 20 mEq daily -Hold metolazone, can add if necessary -Daily weights -Strict I's and O's  CKD stage 4: Creatinine 2.09 and GFR 23 on admission, which is around her baseline. -Avoid nephrotoxic medications -Daily BMP  Chronic anemia: Anemia panel from January 2020 showed vitamin B12 deficiency and likely a mixed picture of anemia of chronic disease and iron deficiency anemia with a  low ferritin level and and low TIBC.  Hemoglobin on admission appears to be at baseline. -Continue Fe supplementation and vitamin B12 supplementation  HLD: Patient takes Crestor at  home. -Continue Crestor  FEN/GI: Heart healthy diet Prophylaxis: Warfarin  Disposition: Medical telemetry  History of Present Illness:  Abigail Duncan is a 78 y.o. female presenting with acute on chronic generalized pain and weakness and urinary frequency and urgency.  She reports that she has had increased pain in her back and legs for the last 2 or 3 weeks and has had difficulty getting up from a seated or supine position for the last week.  She is also noted that she has more swelling in her legs than normal.  She does not weigh herself at home.  She has continued to take her medications as prescribed, although she did not take them this morning.  She says that she has had some increased urinary urgency and frequency as well has some malodorous urine and darker urine than normal over the last few days as well.  She denies any specific joint pain or swelling as well as any skin breakdown.  She has been eating and drinking normally.  She says that her son is having difficulties taking care of her home, and she would be amenable to home health PT/OT as well as admission to SNF.  Review Of Systems: Per HPI with the following additions:   Review of Systems  Constitutional: Positive for malaise/fatigue. Negative for chills, fever and weight loss.  HENT: Negative for congestion.   Respiratory: Negative for cough and shortness of breath.   Cardiovascular: Positive for leg swelling. Negative for chest pain and palpitations.  Gastrointestinal: Negative for abdominal pain, constipation, diarrhea, nausea and vomiting.  Genitourinary: Positive for frequency and urgency. Negative for dysuria and flank pain.  Musculoskeletal: Positive for back pain, falls and joint pain.  Neurological: Positive for weakness. Negative for focal weakness and headaches.    Patient Active Problem List   Diagnosis Date Noted  . Iron deficiency anemia 10/28/2018  . Vitamin B12 deficiency 10/28/2018  . Thrombocytosis ([place])  10/28/2018  . Proud flesh 09/09/2018  . Chronic pain syndrome 07/15/2018  . Stage 3b chronic kidney disease ([place]) 07/15/2018  . Anticoagulation goal of INR 2 to 3 07/15/2018  . Gait difficulty 07/15/2018  . Atrophic kidney, Right 05/20/2018  . Obstructive sleep apnea 05/19/2018    Class: Chronic  . Normocytic anemia 05/19/2018  . Diabetic retinopathy ([place]) 03/11/2018  . Chronic painful diabetic neuropathy ([place]) 09/10/2017  . Secondary hyperparathyroidism ([place]) 05/31/2017  . Anemia of chronic disease 05/31/2017  . Impaired mobility 05/21/2017  . Vitamin D deficiency 01/25/2017  . Shortness of breath 10/16/2016  . Restless leg syndrome 09/19/2015  . Pulmonary hypertension, moderate to severe ([place]) 06/14/2015  . Leg swelling 06/14/2015  . Osteopenia 05/09/2015  . Hypertension associated with chronic kidney disease due to type 2 diabetes mellitus ([place]) 04/01/2015  . Encounter for chronic pain management 12/18/2014  . Preventative health care 01/26/2014  . Long term current use of anticoagulant therapy 11/15/2010  . Atrial fibrillation, permanent 01/22/2009  . ALLERGIC RHINITIS, SEASONAL 01/08/2009  . Type II diabetes mellitus with neurological manifestations ([place]) 12/02/2006  . Pure hypercholesterolemia 12/02/2006  . Morbid obesity ([place]) with obstructive sleep apnea 12/02/2006  . Coronary atherosclerosis 12/02/2006  . Osteoarthritis of multiple joints 12/02/2006  . DDD (degenerative disc disease), lumbar 12/02/2006  . INCONTINENCE, URGE 12/02/2006    Past Medical History: Past Medical History:  Diagnosis Date  . Chronic pain syndrome 07/15/2018  . Degenerative arthritis of knee 04/2001   by xray  . Diabetic peripheral neuropathy ([place]) 10/23/2013  . Diabetic peripheral neuropathy ([place]) 10/23/2013  . Impaired mobility 05/21/2017  . Kidney stone on right side 05/12/2016   [place] 05/2016 - non-obstructing right 6 mm stone  . Obstructive sleep apnea 05/19/2018   Sleep study showed some  apnea and nighttime desaturations. However, did not meet criteria for CPAP.  Marland Kitchen Right knee pain 07/23/2014  . Sciatica of left side 12/2003   DDD L5,S1   . Secondary hyperparathyroidism ([place]) 05/31/2017  . Spinal stenosis, lumbar 04/2004   mild on MRI at L 4-5    Past Surgical History: Past Surgical History:  Procedure Laterality Date  . benign breast tumor     Excised 40+ years ago  . COLPOSCOPY  03/1991   dysplasia  . PTCA (aka ANGIOPLASTY)  07/1993   RCA    Social History: Social History   Tobacco Use  . Smoking status: Never Smoker  . Smokeless tobacco: Never Used  Substance Use Topics  . Alcohol use: No  . Drug use: No   Additional social history:   Please also refer to relevant sections of EMR.  Family History: Family History  Problem Relation Age of Onset  . Cancer Mother        breast, dementia  . Dementia Mother   . Stroke Mother   . Cancer Father 2       colon  . Heart disease Brother   . Cancer Sister        lung  . Heart attack Sister   . Diabetes Brother    (If not completed, MUST add something in)  Allergies and Medications: Allergies  Allergen Reactions  . Lisinopril     REACTION: bad dreams   No current facility-administered medications on file prior to encounter.    Current Outpatient Medications on File Prior to Encounter  Medication Sig Dispense Refill  . enalapril (VASOTEC) 10 MG tablet TAKE 1 TABLET BY MOUTH EVERY DAY (Patient taking differently: Take 10 mg by mouth daily. ) 90 tablet 3  . ferrous sulfate 325 (65 FE) MG tablet Take 325 mg by mouth 3 (three) times daily with meals.    . fluticasone (FLONASE) 50 MCG/ACT nasal spray SPRAY 2 SPRAYS INTO EACH NOSTRIL EVERY DAY (Patient taking differently: Place 2 sprays into both nostrils daily as needed for allergies. ) 16 g 5  . furosemide (LASIX) 40 MG tablet TAKE 1 TABLET BY MOUTH TWICE A DAY (Patient taking differently: Take 40 mg by mouth daily. ) 180 tablet 3  . gabapentin (NEURONTIN)  300 MG capsule TAKE 2 CAPSULES BY MOUTH 3 TIMES A DAY (Patient taking differently: Take 900 mg by mouth 3 (three) times daily. Taking 3 capsules ('900mg'$ ) three times daily) 540 capsule 3  . HYDROcodone-acetaminophen (NORCO/VICODIN) 5-325 MG tablet Take 1 tablet by mouth every 8 (eight) hours as needed for moderate pain. 90 tablet 0  . KLOR-CON M20 20 MEQ tablet TAKE 1 TABLET BY MOUTH EVERY DAY (Patient taking differently: Take 20 mEq by mouth daily. ) 90 tablet 2  . metFORMIN (GLUCOPHAGE) 500 MG tablet TAKE 2 TABLETS BY MOUTH EVERY MORNING AND 1 TABLET EVERY EVENING (Patient taking differently: Take 500-1,000 mg by mouth 2 (two) times daily with a meal. TAKE 2 TABLETS BY MOUTH EVERY MORNING AND 1 TABLET EVERY EVENING) 270 tablet 3  . metolazone (ZAROXOLYN) 2.5 MG tablet  TAKE 1 TABLET BY MOUTH EVERY MONDAY/WEDNESDAY/FRIDAY (Patient taking differently: Take 2.5 mg by mouth 3 (three) times a week. Taking on Monday, Wed, Friday.) 36 tablet 3  . metoprolol tartrate (LOPRESSOR) 100 MG tablet TAKE 1 TABLET (100 MG TOTAL) 2 (TWO) TIMES DAILY BY MOUTH. 180 tablet 3  . nitroGLYCERIN (NITROSTAT) 0.4 MG SL tablet PLACE 1 TABLET (0.4 MG TOTAL) UNDER THE TONGUE EVERY 5 (FIVE) MINUTES AS NEEDED FOR CHEST PAIN. 25 tablet 1  . rosuvastatin (CRESTOR) 20 MG tablet Take 0.5 tablets (10 mg total) by mouth daily. 45 tablet PRN  . vitamin B-12 (CYANOCOBALAMIN) 1000 MCG tablet Take 1,000 mcg by mouth daily.    . Vitamin D, Ergocalciferol, (DRISDOL) 50000 units CAPS capsule Take 50,000 Units by mouth once a week.  0  . warfarin (COUMADIN) 5 MG tablet TAKE AS DIRECTED 90 tablet 0  . calcium-vitamin D (OSCAL WITH D) 500-200 MG-UNIT per tablet Take 2 tablets by mouth daily with breakfast. (Patient not taking: Reported on 02/15/2019) 180 tablet 1  . HYDROcodone-acetaminophen (NORCO) 5-325 MG tablet Take 1 tablet by mouth every 8 (eight) hours as needed for moderate pain. (Patient not taking: Reported on 02/15/2019) 90 tablet 0     Objective: BP 130/71   Pulse (!) 106   Temp 97.7 F (36.5 C) (Oral)   Resp (!) 25   Ht 5' 2.5" (1.588 m)   Wt 136.1 kg   LMP  (LMP Unknown)   SpO2 93%   BMI 54.00 kg/m  Physical Exam Constitutional:      General: She is not in acute distress.    Appearance: She is obese.     Comments: Morbidly obese, appears frail and chronically ill  HENT:     Head: Normocephalic and atraumatic.     Nose: No congestion.     Mouth/Throat:     Mouth: Mucous membranes are moist.     Pharynx: Oropharynx is clear.  Eyes:     Extraocular Movements: Extraocular movements intact.     Conjunctiva/sclera: Conjunctivae normal.  Neck:     Musculoskeletal: Normal range of motion.  Cardiovascular:     Rate and Rhythm: Tachycardia present. Rhythm irregular.  Pulmonary:     Effort: Pulmonary effort is normal. No respiratory distress.     Breath sounds: Normal breath sounds.  Abdominal:     General: Abdomen is flat. Bowel sounds are normal.     Palpations: Abdomen is soft. There is no mass.     Tenderness: There is no abdominal tenderness.  Musculoskeletal: Normal range of motion.     Comments: Trace bilateral pedal edema  Skin:    General: Skin is warm and dry.     Comments: Small areas of ecchymosis on upper extremities  Neurological:     General: No focal deficit present.     Mental Status: She is alert and oriented to person, place, and time.     Cranial Nerves: No cranial nerve deficit.  Psychiatric:        Mood and Affect: Mood normal.        Behavior: Behavior normal.     Labs and Imaging: CBC BMET  Recent Labs  Lab 02/15/19 1027  WBC 12.9*  HGB 10.6*  HCT 35.5*  PLT 422*   Recent Labs  Lab 02/15/19 1027  NA 135  K 4.5  CL 95*  CO2 21*  BUN 49*  CREATININE 2.06*  GLUCOSE 167*  CALCIUM 9.2     Dg Chest Port 1  View  Result Date: 02/15/2019 CLINICAL DATA:  78 year old female EXAM: PORTABLE CHEST 1 VIEW COMPARISON:  03/02/2017 FINDINGS: Cardiomediastinal silhouette  unchanged in size and contour. Calcifications of the aortic arch. Similar appearance of coarsened interstitial markings of the bilateral lungs. No pneumothorax. No pleural effusion. No new confluent airspace disease.  No displaced fracture IMPRESSION: Similar appearance of chronic lung changes without evidence of acute cardiopulmonary disease Electronically Signed   By: Corrie Mckusick D.O.   On: 02/15/2019 11:04     Kathrene Alu, MD 02/15/2019, 5:28 PM PGY-2, [place] Intern pager: [phone], text pages welcome

## 2019-02-15 NOTE — TOC Initial Note (Signed)
Transition of Care [facility]) - Initial/Assessment Note    Patient Details  Name: Abigail Duncan MRN: [id number] Date of Birth: [birth year]  [facility] of [facility]) CM/SW Contact:    Fuller Mandril, RN Phone Number: 02/15/2019, 3:58 PM  Clinical Narrative:                 [facility] consulted to assist with disposition needs.  [facility] met with pt at bedside to discuss options.  Pt says she is getting weaker and can not do many things for herself.   Barriers to Discharge: Continued Medical Work up   Patient Goals and CMS Choice Patient states their goals for this hospitalization and ongoing recovery are:: get stonger, I'm so weak CMS Medicare.gov Compare Post Acute Care list provided to:: Patient Choice offered to / list presented to : Patient  Expected Discharge Plan and Services   In-house Referral: Clinical Social Work Discharge Planning Services: CM Consult   Living arrangements for the past 2 months: Mobile Home Expected Discharge Date: (TBD)                         HH Arranged: RN, PT [place] Agency: Rachel ([facility]) Date [place]: 02/15/19 Time [place]: [place] Representative spoke with at [place]: Janae Sauce  Prior Living Arrangements/Services Living arrangements for the past 2 months: Mobile Home Lives with:: Adult Children Patient language and need for interpreter reviewed:: Yes Do you feel safe going back to the place where you live?: Yes      Need for Family Participation in Patient Care: Yes (Comment) Care giver support system in place?: Yes (comment)      Activities of Daily Living      Permission Sought/Granted Permission sought to share information with : Case Manager, Family Supports, Chartered certified accountant granted to share information with : Yes, Verbal Permission Granted  Share Information with NAME: Clovis Riley           Emotional Assessment Appearance:: Appears stated  age Attitude/Demeanor/Rapport: Engaged, Gracious Affect (typically observed): Calm, Appropriate, Pleasant Orientation: : Oriented to Self, Oriented to Place, Oriented to  Time, Oriented to Situation Alcohol / Substance Use: Never Used Psych Involvement: No (comment)  Admission diagnosis:  weakness Patient Active Problem List   Diagnosis Date Noted  . Iron deficiency anemia 10/28/2018  . Vitamin B12 deficiency 10/28/2018  . Thrombocytosis ([place]) 10/28/2018  . Proud flesh 09/09/2018  . Chronic pain syndrome 07/15/2018  . Stage 3b chronic kidney disease ([place]) 07/15/2018  . Anticoagulation goal of INR 2 to 3 07/15/2018  . Gait difficulty 07/15/2018  . Atrophic kidney, Right 05/20/2018  . Obstructive sleep apnea 05/19/2018    Class: Chronic  . Normocytic anemia 05/19/2018  . Diabetic retinopathy ([place]) 03/11/2018  . Chronic painful diabetic neuropathy ([place]) 09/10/2017  . Secondary hyperparathyroidism ([place]) 05/31/2017  . Anemia of chronic disease 05/31/2017  . Impaired mobility 05/21/2017  . Vitamin D deficiency 01/25/2017  . Shortness of breath 10/16/2016  . Restless leg syndrome 09/19/2015  . Pulmonary hypertension, moderate to severe ([place]) 06/14/2015  . Leg swelling 06/14/2015  . Osteopenia 05/09/2015  . Hypertension associated with chronic kidney disease due to type 2 diabetes mellitus ([place]) 04/01/2015  . Encounter for chronic pain management 12/18/2014  . Preventative health care 01/26/2014  . Long term current use of anticoagulant therapy 11/15/2010  . Atrial fibrillation, permanent 01/22/2009  . ALLERGIC RHINITIS, SEASONAL 01/08/2009  . Type II diabetes  mellitus with neurological manifestations ([place]) 12/02/2006  . Pure hypercholesterolemia 12/02/2006  . Morbid obesity ([place]) with obstructive sleep apnea 12/02/2006  . Coronary atherosclerosis 12/02/2006  . Osteoarthritis of multiple joints 12/02/2006  . DDD (degenerative disc disease), lumbar 12/02/2006  . INCONTINENCE, URGE  12/02/2006   PCP:  McDiarmid, Blane Ohara, MD Pharmacy:   [id number] [place]Phone: [phone]: [phone] [place] [id number]: [phone]: [phone]    Social Determinants of Health (SDOH) Interventions    Readmission Risk Interventions No flowsheet data found.

## 2019-02-15 NOTE — ED Provider Notes (Signed)
[facility] DEPARTMENT Provider Note   CSN: [id number] Arrival date & time: 02/15/19  1018    History   Chief Complaint Chief Complaint  Patient presents with   Fatigue   Leg Pain    HPI Abigail Duncan is a 78 y.o. female.  She has history of chronic pain peripheral neuropathy anemia kidney disease.  She lives at home with her son.  Per EMS she has had diffuse pain throughout her body for the past few weeks and has not been able to get out of bed for the past few days.  She is on chronic narcotics for right-sided sciatic pain for over a year.  Currently she is complaining of 10 out of 10 generalized pain.  She denies any illness symptoms including no fever cough shortness of breath vomiting diarrhea or urinary symptoms.  She said she is frequently incontinent of urine.  Uses a cane or walker at baseline.  There is been no recent falls.  No change in her medications.  Last dose of narcotics was last evening.     The history is provided by the patient.  Weakness  Severity:  Unable to specify Onset quality:  Gradual Progression:  Unchanged Chronicity:  New Relieved by:  None tried Worsened by:  Activity Ineffective treatments:  None tried Associated symptoms: arthralgias, difficulty walking and myalgias   Associated symptoms: no abdominal pain, no chest pain, no cough, no diarrhea, no dysuria, no falls, no fever, no headaches, no shortness of breath, no vision change and no vomiting   Risk factors: anemia     Past Medical History:  Diagnosis Date   Chronic pain syndrome 07/15/2018   Degenerative arthritis of knee 04/2001   by xray   Diabetic peripheral neuropathy ([place]) 10/23/2013   Diabetic peripheral neuropathy ([place]) 10/23/2013   Impaired mobility 05/21/2017   Kidney stone on right side 05/12/2016   [place] 05/2016 - non-obstructing right 6 mm stone   Obstructive sleep apnea 05/19/2018   Sleep study showed some apnea and nighttime desaturations.  However, did not meet criteria for CPAP.   Right knee pain 07/23/2014   Sciatica of left side 12/2003   DDD L5,S1    Secondary hyperparathyroidism ([place]) 05/31/2017   Spinal stenosis, lumbar 04/2004   mild on MRI at L 4-5    Patient Active Problem List   Diagnosis Date Noted   Iron deficiency anemia 10/28/2018   Vitamin B12 deficiency 10/28/2018   Thrombocytosis ([place]) 10/28/2018   Proud flesh 09/09/2018   Chronic pain syndrome 07/15/2018   Stage 3b chronic kidney disease ([place]) 07/15/2018   Anticoagulation goal of INR 2 to 3 07/15/2018   Gait difficulty 07/15/2018   Atrophic kidney, Right 05/20/2018   Obstructive sleep apnea 05/19/2018    Class: Chronic   Normocytic anemia 05/19/2018   Diabetic retinopathy ([place]) 03/11/2018   Chronic painful diabetic neuropathy ([place]) 09/10/2017   Secondary hyperparathyroidism ([place]) 05/31/2017   Anemia of chronic disease 05/31/2017   Impaired mobility 05/21/2017   Vitamin D deficiency 01/25/2017   Shortness of breath 10/16/2016   Restless leg syndrome 09/19/2015   Pulmonary hypertension, moderate to severe ([place]) 06/14/2015   Leg swelling 06/14/2015   Osteopenia 05/09/2015   Hypertension associated with chronic kidney disease due to type 2 diabetes mellitus ([place]) 04/01/2015   Encounter for chronic pain management 12/18/2014   Preventative health care 01/26/2014   Long term current use of anticoagulant therapy 11/15/2010   Atrial fibrillation, permanent 01/22/2009  ALLERGIC RHINITIS, SEASONAL 01/08/2009   Type II diabetes mellitus with neurological manifestations ([place]) 12/02/2006   Pure hypercholesterolemia 12/02/2006   Morbid obesity ([place]) with obstructive sleep apnea 12/02/2006   Coronary atherosclerosis 12/02/2006   Osteoarthritis of multiple joints 12/02/2006   DDD (degenerative disc disease), lumbar 12/02/2006   INCONTINENCE, URGE 12/02/2006    Past Surgical History:  Procedure Laterality Date    benign breast tumor     Excised 40+ years ago   COLPOSCOPY  03/1991   dysplasia   PTCA (aka ANGIOPLASTY)  07/1993   RCA     OB History   No obstetric history on file.      Home Medications    Prior to Admission medications   Medication Sig Start Date End Date Taking? Authorizing Provider  calcium-vitamin D (OSCAL WITH D) 500-200 MG-UNIT per tablet Take 2 tablets by mouth daily with breakfast. 05/09/15   [name], MD  enalapril (VASOTEC) 10 MG tablet TAKE 1 TABLET BY MOUTH EVERY DAY 04/22/18   Jettie Booze, MD  fluticasone [facility]) 50 MCG/ACT nasal spray SPRAY 2 SPRAYS INTO EACH NOSTRIL EVERY DAY 07/25/18   McDiarmid, Blane Ohara, MD  furosemide (LASIX) 40 MG tablet TAKE 1 TABLET BY MOUTH TWICE A DAY 04/29/18   Jettie Booze, MD  gabapentin (NEURONTIN) 300 MG capsule TAKE 2 CAPSULES BY MOUTH 3 TIMES A DAY 01/18/19   McDiarmid, Blane Ohara, MD  HYDROcodone-acetaminophen (NORCO) 5-325 MG tablet Take 1 tablet by mouth every 8 (eight) hours as needed for moderate pain. 12/27/18   McDiarmid, Blane Ohara, MD  HYDROcodone-acetaminophen (NORCO/VICODIN) 5-325 MG tablet Take 1 tablet by mouth every 8 (eight) hours as needed for moderate pain. 12/27/18   McDiarmid, Blane Ohara, MD  KLOR-CON M20 20 MEQ tablet TAKE 1 TABLET BY MOUTH EVERY DAY 01/02/19   Jettie Booze, MD  metFORMIN (GLUCOPHAGE) 500 MG tablet TAKE 2 TABLETS BY MOUTH EVERY MORNING AND 1 TABLET EVERY EVENING 12/05/18   McDiarmid, Blane Ohara, MD  metolazone (ZAROXOLYN) 2.5 MG tablet TAKE 1 TABLET BY MOUTH EVERY MONDAY/WEDNESDAY/FRIDAY 01/04/19   Isaiah Serge, NP  metoprolol tartrate (LOPRESSOR) 100 MG tablet TAKE 1 TABLET (100 MG TOTAL) 2 (TWO) TIMES DAILY BY MOUTH. 08/03/18   McDiarmid, Blane Ohara, MD  nitroGLYCERIN (NITROSTAT) 0.4 MG SL tablet PLACE 1 TABLET (0.4 MG TOTAL) UNDER THE TONGUE EVERY 5 (FIVE) MINUTES AS NEEDED FOR CHEST PAIN. 07/28/17   McDiarmid, Blane Ohara, MD  rosuvastatin (CRESTOR) 20 MG tablet Take 0.5 tablets (10 mg total) by  mouth daily. 04/20/17   McDiarmid, Blane Ohara, MD  Vitamin D, Ergocalciferol, (DRISDOL) 50000 units CAPS capsule Take 50,000 Units by mouth once a week. 01/12/18   [provider]  warfarin (COUMADIN) 5 MG tablet TAKE AS DIRECTED 12/19/18   McDiarmid, Blane Ohara, MD    Family History Family History  Problem Relation Age of Onset   Cancer Mother        breast, dementia   Dementia Mother    Stroke Mother    Cancer Father 66       colon   Heart disease Brother    Cancer Sister        lung   Heart attack Sister    Diabetes Brother     Social History Social History   Tobacco Use   Smoking status: Never Smoker   Smokeless tobacco: Never Used  Substance Use Topics   Alcohol use: No   Drug use: No  Allergies   Lisinopril   Review of Systems Review of Systems  Constitutional: Positive for activity change. Negative for fever.  HENT: Negative for sore throat.   Eyes: Negative for visual disturbance.  Respiratory: Negative for cough and shortness of breath.   Cardiovascular: Negative for chest pain.  Gastrointestinal: Negative for abdominal pain, diarrhea and vomiting.  Genitourinary: Negative for dysuria.  Musculoskeletal: Positive for arthralgias and myalgias. Negative for falls.  Skin: Negative for rash.  Neurological: Positive for weakness. Negative for headaches.     Physical Exam Updated Vital Signs BP (!) 139/59    Pulse (!) 124    Temp 97.7 F (36.5 C) (Oral)    Resp 20    Ht 5' 2.5" (1.588 m)    Wt 136.1 kg    LMP  (LMP Unknown)    SpO2 98%    BMI 54.00 kg/m   Physical Exam Vitals signs and nursing note reviewed.  Constitutional:      General: She is not in acute distress.    Appearance: She is well-developed.  HENT:     Head: Normocephalic and atraumatic.  Eyes:     Conjunctiva/sclera: Conjunctivae normal.  Neck:     Musculoskeletal: Neck supple.  Cardiovascular:     Rate and Rhythm: Tachycardia present. Rhythm irregular.     Pulses:  Normal pulses.     Heart sounds: No murmur.  Pulmonary:     Effort: Pulmonary effort is normal. No respiratory distress.     Breath sounds: Normal breath sounds.  Abdominal:     Palpations: Abdomen is soft.     Tenderness: There is no abdominal tenderness.  Musculoskeletal: Normal range of motion.        General: No deformity.  Skin:    General: Skin is warm and dry.     Capillary Refill: Capillary refill takes less than 2 seconds.  Neurological:     General: No focal deficit present.     Mental Status: She is alert.     Comments: She is awake and alert.  Normal speech no obvious facial droop.  She is moving all extremities.  She is oriented to self and place but thought it was April.      ED Treatments / Results  Labs (all labs ordered are listed, but only abnormal results are displayed) Labs Reviewed  LACTIC ACID, PLASMA - Abnormal; Notable for the following components:      Result Value   Lactic Acid, Venous 2.0 (*)    All other components within normal limits  COMPREHENSIVE METABOLIC PANEL - Abnormal; Notable for the following components:   Chloride 95 (*)    CO2 21 (*)    Glucose, Bld 167 (*)    BUN 49 (*)    Creatinine, Ser 2.06 (*)    Albumin 2.9 (*)    GFR calc non Af Amer 23 (*)    GFR calc Af Amer 27 (*)    Anion gap 19 (*)    All other components within normal limits  CBC WITH DIFFERENTIAL/PLATELET - Abnormal; Notable for the following components:   WBC 12.9 (*)    Hemoglobin 10.6 (*)    HCT 35.5 (*)    MCH 25.4 (*)    MCHC 29.9 (*)    RDW 17.2 (*)    Platelets 422 (*)    Neutro Abs 10.3 (*)    Abs Immature Granulocytes 0.09 (*)    All other components within normal limits  URINALYSIS, ROUTINE W REFLEX  MICROSCOPIC - Abnormal; Notable for the following components:   APPearance TURBID (*)    Hgb urine dipstick TRACE (*)    Nitrite POSITIVE (*)    Leukocytes,Ua SMALL (*)    All other components within normal limits  PROTIME-INR - Abnormal; Notable for  the following components:   Prothrombin Time 25.7 (*)    INR 2.4 (*)    All other components within normal limits  SEDIMENTATION RATE - Abnormal; Notable for the following components:   Sed Rate 75 (*)    All other components within normal limits  C-REACTIVE PROTEIN - Abnormal; Notable for the following components:   CRP 12.6 (*)    All other components within normal limits  URINALYSIS, MICROSCOPIC (REFLEX) - Abnormal; Notable for the following components:   Bacteria, UA MANY (*)    All other components within normal limits  BASIC METABOLIC PANEL - Abnormal; Notable for the following components:   Glucose, Bld 153 (*)    BUN 41 (*)    Creatinine, Ser 1.74 (*)    GFR calc non Af Amer 28 (*)    GFR calc Af Amer 33 (*)    All other components within normal limits  CBC WITH DIFFERENTIAL/PLATELET - Abnormal; Notable for the following components:   WBC 10.8 (*)    Hemoglobin 10.4 (*)    HCT 33.3 (*)    MCH 25.7 (*)    RDW 16.8 (*)    Neutro Abs 7.8 (*)    All other components within normal limits  PROTIME-INR - Abnormal; Notable for the following components:   Prothrombin Time 26.4 (*)    INR 2.5 (*)    All other components within normal limits  GLUCOSE, CAPILLARY - Abnormal; Notable for the following components:   Glucose-Capillary 161 (*)    All other components within normal limits  SARS CORONAVIRUS 2 (HOSPITAL ORDER, [facility])  URINE CULTURE  CULTURE, BLOOD (ROUTINE X 2)  CULTURE, BLOOD (ROUTINE X 2)  LACTIC ACID, PLASMA  TROPONIN I  CK    EKG EKG Interpretation  Date/Time:  Wednesday Feb 15 2019 10:27:10 EDT Ventricular Rate:  106 PR Interval:    QRS Duration: 80 QT Interval:  293 QTC Calculation: 389 R Axis:   42 Text Interpretation:  Atrial fibrillation Nonspecific repol abnormality, diffuse leads increased rate, otherwise similar to prior 4/10 Confirmed by Aletta Edouard [phone]) on 02/15/2019 10:42:51 AM Also confirmed by Aletta Edouard [phone]), editor Philomena Doheny [phone])  on 02/15/2019 3:55:01 PM   Radiology Dg Chest Port 1 View  Result Date: 02/15/2019 CLINICAL DATA:  78 year old female EXAM: PORTABLE CHEST 1 VIEW COMPARISON:  03/02/2017 FINDINGS: Cardiomediastinal silhouette unchanged in size and contour. Calcifications of the aortic arch. Similar appearance of coarsened interstitial markings of the bilateral lungs. No pneumothorax. No pleural effusion. No new confluent airspace disease.  No displaced fracture IMPRESSION: Similar appearance of chronic lung changes without evidence of acute cardiopulmonary disease Electronically Signed   By: Corrie Mckusick D.O.   On: 02/15/2019 11:04    Procedures Procedures (including critical care time)  Medications Ordered in ED Medications  morphine 4 MG/ML injection 4 mg (has no administration in time range)     Initial Impression / Assessment and Plan / ED Course  I have reviewed the triage vital signs and the nursing notes.  Pertinent labs & imaging results that were available during my care of the patient were reviewed by me and considered in my medical decision  making (see chart for details).  Clinical Course as of Feb 15 605  Wed Feb 15, 1961  2883 78 year old female with chronic pain here with generalized weakness and all over body pain.  No fall.  Differential is broad including Covid, [place], pneumonia, PMR, anemia, sepsis.   [MB]  1583 Nurse was unable to get an IV in the patient's called the IV team.  Her lactate is slightly elevated at 2.0.  This possibly is related to infection but she is also been tachycardic and her A. fib so this may just be related to her rate.  White blood cell count slightly elevated at 12.9.  Her chest x-ray which was reviewed by me does not show an acute infiltrate.  Urinalysis is just been obtained.   [MB]  1202 Patient's BUN and creatinine are slightly more elevated than her baseline values.  Troponin negative.  CK normal.   [MB]    0940 Patient's urinalysis back with a possible UTI.  It is only 11-20 whites but is nitrite positive.  Covid testing negative.  I have ordered her some pain medicine IV fluids and an oral dose of her metoprolol that she did not take this morning.  Muscle adding some IV antibiotics now.  Will reevaluate to see if she is able to go home.   [MB]  1320 I updated the patient on her results.  She is hoping to be able to go home.  We will let antibiotics infused and then see if we can get her up.   [MB]  7680 Patient's repeat lactate has normalized.  I reached out to the patient's son who said she has not been able to get up out of bed for at least a week and she is fallen.  He is having difficulty caring for her secondary to her immobility.  Have placed a social work consult.   [MB]    Clinical Course User Index [MB] Hayden Rasmussen, MD   FARRELL BROERMAN was evaluated in Emergency Department on 02/15/2019 for the symptoms described in the history of present illness. She was evaluated in the context of the global COVID-19 pandemic, which necessitated consideration that the patient might be at risk for infection with the SARS-CoV-2 virus that causes COVID-19. Institutional protocols and algorithms that pertain to the evaluation of patients at risk for COVID-19 are in a state of rapid change based on information released by regulatory bodies including the CDC and federal and state organizations. These policies and algorithms were followed during the patient's care in the ED.      Final Clinical Impressions(s) / ED Diagnoses   Final diagnoses:  Lower urinary tract infectious disease  Weakness  Arthralgia of multiple sites    ED Discharge Orders    None       Hayden Rasmussen, MD 02/16/19 [phone]

## 2019-02-15 NOTE — ED Triage Notes (Signed)
Pt BIB [facility]. Per EMS patient has had increased generalized pain all over for the past few weeks. Per EMS patient's son reports he has been unable to get the patient out of bed for the past one to two days. Pt complaining of 10/10 generalized pain at present time Pt denies any fevers cough at present time.

## 2019-02-15 NOTE — Progress Notes (Signed)
ANTICOAGULATION CONSULT NOTE - Initial Consult  Pharmacy Consult for Warfarin Indication: atrial fibrillation  Allergies  Allergen Reactions  . Lisinopril     REACTION: bad dreams    Patient Measurements: Height: 5' 2.5" (158.8 cm) Weight: 300 lb (136.1 kg) IBW/kg (Calculated) : 51.25  Vital Signs: Temp: 97.7 F (36.5 C) (05/13 1024) Temp Source: Oral (05/13 1024) BP: 137/88 (05/13 1700) Pulse Rate: 107 (05/13 1700)  Labs: Recent Labs    02/15/19 1027  HGB 10.6*  HCT 35.5*  PLT 422*  LABPROT 25.7*  INR 2.4*  CREATININE 2.06*  CKTOTAL 189  TROPONINI <0.03    Estimated Creatinine Clearance: 31.7 mL/min (A) (by C-G formula based on SCr of 2.06 mg/dL (H)).   Medical History: Past Medical History:  Diagnosis Date  . Chronic pain syndrome 07/15/2018  . Degenerative arthritis of knee 04/2001   by xray  . Diabetic peripheral neuropathy ([place]) 10/23/2013  . Diabetic peripheral neuropathy ([place]) 10/23/2013  . Impaired mobility 05/21/2017  . Kidney stone on right side 05/12/2016   [place] 05/2016 - non-obstructing right 6 mm stone  . Obstructive sleep apnea 05/19/2018   Sleep study showed some apnea and nighttime desaturations. However, did not meet criteria for CPAP.  Marland Kitchen Right knee pain 07/23/2014  . Sciatica of left side 12/2003   DDD L5,S1   . Secondary hyperparathyroidism ([place]) 05/31/2017  . Spinal stenosis, lumbar 04/2004   mild on MRI at L 4-5   Assessment: Pt is a 77yoF on warfarin PTA for atrial fibrillation. INR on admission 2.4. PTA dose: warfarin 2.5 mg daily xc 5 mg on M/W/F.   Goal of Therapy:  INR 2-3 Monitor platelets by anticoagulation protocol: Yes   Plan:  Warfarin 5 mg x1 Daily INR, monitor for signs/symptoms of bleeding  Claiborne Billings, PharmD PGY2 Cardiology Pharmacy Resident Please check AMION for all Pharmacist numbers by unit 02/15/2019 6:46 PM

## 2019-02-16 ENCOUNTER — Inpatient Hospital Stay (HOSPITAL_COMMUNITY): Payer: Medicare Other

## 2019-02-16 ENCOUNTER — Telehealth: Payer: Medicare Other | Admitting: Family Medicine

## 2019-02-16 DIAGNOSIS — N390 Urinary tract infection, site not specified: Secondary | ICD-10-CM

## 2019-02-16 DIAGNOSIS — I272 Pulmonary hypertension, unspecified: Secondary | ICD-10-CM

## 2019-02-16 DIAGNOSIS — R700 Elevated erythrocyte sedimentation rate: Secondary | ICD-10-CM

## 2019-02-16 DIAGNOSIS — M2550 Pain in unspecified joint: Secondary | ICD-10-CM

## 2019-02-16 LAB — GLUCOSE, CAPILLARY
Glucose-Capillary: 202 mg/dL — ABNORMAL HIGH (ref 70–99)
Glucose-Capillary: 248 mg/dL — ABNORMAL HIGH (ref 70–99)
Glucose-Capillary: 169 mg/dL — ABNORMAL HIGH (ref 70–99)
Glucose-Capillary: 246 mg/dL — ABNORMAL HIGH (ref 70–99)

## 2019-02-16 LAB — CBC WITH DIFFERENTIAL/PLATELET
Abs Immature Granulocytes: 0.07 10*3/uL (ref 0.00–0.07)
Basophils Absolute: 0 10*3/uL (ref 0.0–0.1)
Basophils Relative: 0 %
Eosinophils Absolute: 0.2 10*3/uL (ref 0.0–0.5)
Eosinophils Relative: 2 %
HCT: 33.3 % — ABNORMAL LOW (ref 36.0–46.0)
Hemoglobin: 10.4 g/dL — ABNORMAL LOW (ref 12.0–15.0)
Immature Granulocytes: 1 %
Lymphocytes Relative: 17 %
Lymphs Abs: 1.8 10*3/uL (ref 0.7–4.0)
MCH: 25.7 pg — ABNORMAL LOW (ref 26.0–34.0)
MCHC: 31.2 g/dL (ref 30.0–36.0)
MCV: 82.2 fL (ref 80.0–100.0)
Monocytes Absolute: 0.9 10*3/uL (ref 0.1–1.0)
Monocytes Relative: 9 %
Neutro Abs: 7.8 10*3/uL — ABNORMAL HIGH (ref 1.7–7.7)
Neutrophils Relative %: 71 %
Platelets: 400 10*3/uL (ref 150–400)
RBC: 4.05 MIL/uL (ref 3.87–5.11)
RDW: 16.8 % — ABNORMAL HIGH (ref 11.5–15.5)
WBC: 10.8 10*3/uL — ABNORMAL HIGH (ref 4.0–10.5)
nRBC: 0 % (ref 0.0–0.2)

## 2019-02-16 LAB — ECHOCARDIOGRAM COMPLETE
Height: 63 in
Weight: 4208.14 oz

## 2019-02-16 LAB — BASIC METABOLIC PANEL
Anion gap: 12 (ref 5–15)
BUN: 41 mg/dL — ABNORMAL HIGH (ref 8–23)
CO2: 27 mmol/L (ref 22–32)
Calcium: 8.9 mg/dL (ref 8.9–10.3)
Chloride: 99 mmol/L (ref 98–111)
Creatinine, Ser: 1.74 mg/dL — ABNORMAL HIGH (ref 0.44–1.00)
GFR calc Af Amer: 33 mL/min — ABNORMAL LOW (ref >60)
GFR calc non Af Amer: 28 mL/min — ABNORMAL LOW (ref >60)
Glucose, Bld: 153 mg/dL — ABNORMAL HIGH (ref 70–99)
Potassium: 4 mmol/L (ref 3.5–5.1)
Sodium: 138 mmol/L (ref 135–145)

## 2019-02-16 LAB — PROTIME-INR
INR: 2.7 — ABNORMAL HIGH (ref 0.8–1.2)
Prothrombin Time: 28.6 seconds — ABNORMAL HIGH (ref 11.4–15.2)

## 2019-02-16 MED ORDER — PREDNISONE 5 MG PO TABS
12.5000 mg | ORAL_TABLET | Freq: Every day | ORAL | Status: DC
  Administered 2019-02-16 – 2019-02-21 (×6): 12.5 mg via ORAL
  Filled 2019-02-16 (×6): qty 1

## 2019-02-16 MED ORDER — APIXABAN 5 MG PO TABS: 5.0000 mg | ORAL_TABLET | Freq: Two times a day (BID) | ORAL | Status: DC

## 2019-02-16 MED ORDER — ONDANSETRON HCL 4 MG PO TABS
4.0000 mg | ORAL_TABLET | Freq: Four times a day (QID) | ORAL | Status: DC | PRN
  Administered 2019-02-16: 4 mg via ORAL
  Filled 2019-02-16: qty 1

## 2019-02-16 MED ORDER — WARFARIN SODIUM 2.5 MG PO TABS: 2.5000 mg | ORAL_TABLET | Freq: Once | ORAL | Status: DC

## 2019-02-16 MED ORDER — HYDROCODONE-ACETAMINOPHEN 5-325 MG PO TABS
0.5000 | ORAL_TABLET | Freq: Four times a day (QID) | ORAL | Status: DC | PRN
  Administered 2019-02-16: 0.5 via ORAL
  Filled 2019-02-16: qty 1

## 2019-02-16 NOTE — Progress Notes (Signed)
  Echocardiogram 2D Echocardiogram has been performed.  Abigail Duncan 02/16/2019, 3:55 PM

## 2019-02-16 NOTE — Evaluation (Signed)
Occupational Therapy Evaluation Patient Details Name: Abigail Duncan MRN: [id number] DOB: 04/04/1941 Today's Date: 02/16/2019    History of Present Illness 78 y.o. female admitted on 02/15/19 for generalized pain and weakness.  R/o UTI and medical workup pending.  Pt with PMH of lumbar spinal stenosis, sciatica L side, R knee pain, and diabetic peripheral neuopathy.   Clinical Impression   Pt PTA: reports x3 weeks of weakness and falls. Pt's son unable to pick pt up off of floor so pt came by EMS. Pt reports prior to 3 weeks ago, she was independent with mobility using rollator and supervisionA for ADL. Pt currently very limited by soreness, pain and weakness all over. Pt maxA for rolling- safer with +2. Pt attempting to stand with stedy and pt unable to lift bottom/ not even able to attempt pulling up from stedy. Pt with poor activity tolerance, poor strength and limited mobility requires OT skilled services and OT to follow acutely.    Follow Up Recommendations  SNF;Supervision/Assistance - 24 hour    Equipment Recommendations  Other (comment)(to be determined)    Recommendations for Other Services       Precautions / Restrictions Precautions Precautions: Fall Restrictions Weight Bearing Restrictions: No      Mobility Bed Mobility                  Transfers Overall transfer level: Needs assistance Equipment used: (stedy)             General transfer comment: Pt unable to attempt standing due to weakness. Baristedy- pt unable to lift bottom off of bed.    Balance Overall balance assessment: Needs assistance   Sitting balance-Leahy Scale: Fair Sitting balance - Comments: supervisionA for sitting EOB                                   ADL either performed or assessed with clinical judgement   ADL Overall ADL's : Needs assistance/impaired Eating/Feeding: Set up;Sitting   Grooming: Min guard;Sitting   Upper Body Bathing: Moderate  assistance;Sitting   Lower Body Bathing: Total assistance;Sitting/lateral leans;Sit to/from stand;+2 for physical assistance;+2 for safety/equipment   Upper Body Dressing : Moderate assistance;Sitting   Lower Body Dressing: Total assistance;+2 for physical assistance;+2 for safety/equipment;Sitting/lateral leans;Sit to/from stand   Toilet Transfer: Total assistance;BSC   Toileting- Clothing Manipulation and Hygiene: Total assistance;+2 for physical assistance;+2 for safety/equipment;Bed level       Functional mobility during ADLs: Maximal assistance;+2 for physical assistance;+2 for safety/equipment(rolling and sitting to EOB) General ADL Comments: Pt performing minimal ADL as pt with weakness and increased pain "all over." pt attempting to stand with stedy and pt unable to practice lifting bottom off of bed without totalA.       Vision Baseline Vision/History: No visual deficits Vision Assessment?: No apparent visual deficits     Perception     Praxis      Pertinent Vitals/Pain Pain Assessment: Faces Faces Pain Scale: Hurts whole lot Pain Location: generalized all over pain Pain Descriptors / Indicators: Grimacing;Guarding Pain Intervention(s): Limited activity within patient's tolerance     Hand Dominance     Extremity/Trunk Assessment Upper Extremity Assessment Upper Extremity Assessment: Generalized weakness;RUE deficits/detail;LUE deficits/detail RUE Deficits / Details: 2/5 Mm grade shoulder through digits LUE Deficits / Details: 2/5 Mm grade shoulder through digits   Lower Extremity Assessment Lower Extremity Assessment: Defer to PT evaluation RLE Deficits / Details: right  leg is weaker than left. She is unable to lift either leg against gravity or even move in gravity eliminated positions.   RLE Sensation: WNL LLE Deficits / Details: Pt is able to grossly move left leg, attempt rotating and bending at the knee, this is slightly better than her right leg, but she  is still unable to lift L leg against gravity either.  LLE Sensation: WNL   Cervical / Trunk Assessment Cervical / Trunk Assessment: Kyphotic   Communication Communication Communication: No difficulties   Cognition Arousal/Alertness: Awake/alert Behavior During Therapy: WFL for tasks assessed/performed Overall Cognitive Status: No family/caregiver present to determine baseline cognitive functioning                                 General Comments: Followed commands   General Comments       Exercises     Shoulder Instructions      Home Living Family/patient expects to be discharged to:: Unsure Living Arrangements: Children Available Help at Discharge: Family;Available PRN/intermittently(son works late afternoon) Type of Home: Mobile home Home Access: Level entry;Ramped entrance     Home Layout: One level     Bathroom Shower/Tub: Walk-in shower;Tub/shower unit   Bathroom Toilet: Handicapped height     Home Equipment: Wheelchair - [employer] - 4 wheels;Cane - single point;Cane - quad;Shower seat;Grab bars - tub/shower;Hand held shower head;Bedside commode   Additional Comments: Does not have any HH services or aides      Prior Functioning/Environment Level of Independence: Needs assistance  Gait / Transfers Assistance Needed: used rollator for mobility ADL's / Homemaking Assistance Needed: cooking, cleaning  by son; pt unable put socks on (she does have sock aid for assist) otherwise able to perform bathing/dress/groom    Comments: Pt reports three falls in the past 3 weeks and EMS had to be called to get her up off of the ground.         OT Problem List: Decreased strength;Decreased activity tolerance;Impaired balance (sitting and/or standing);Decreased range of motion;Decreased coordination;Decreased safety awareness;Pain      OT Treatment/Interventions: Self-care/ADL training;Therapeutic exercise;Neuromuscular education;Energy  conservation;Therapeutic activities;Balance training;Patient/family education    OT Goals(Current goals can be found in the care plan section) Acute Rehab OT Goals Patient Stated Goal: wants to figure out what is going on with her and decrease pain OT Goal Formulation: With patient Time For Goal Achievement: 03/02/19 Potential to Achieve Goals: Good ADL Goals Pt Will Perform Grooming: with modified independence;standing Pt Will Perform Upper Body Dressing: with modified independence;sitting Pt Will Transfer to Toilet: with supervision;ambulating Additional ADL Goal #1: Pt will sit EOB x15 mins for ADL tasks with minA.  OT Frequency: Min 2X/week   Barriers to D/C:            Co-evaluation              AM-PAC OT "6 Clicks" Daily Activity     Outcome Measure Help from another person eating meals?: None Help from another person taking care of personal grooming?: A Little Help from another person toileting, which includes using toliet, bedpan, or urinal?: Total Help from another person bathing (including washing, rinsing, drying)?: A Lot Help from another person to put on and taking off regular upper body clothing?: A Lot Help from another person to put on and taking off regular lower body clothing?: Total 6 Click Score: 13   End of Session Equipment Utilized During Treatment: Gait  belt Nurse Communication: Mobility status;Need for lift equipment  Activity Tolerance: Patient limited by pain Patient left: in bed;with call bell/phone within reach;with bed alarm set  OT Visit Diagnosis: Unsteadiness on feet (R26.81);Muscle weakness (generalized) (M62.81)                Time: 5208-0223 OT Time Calculation (min): 49 min Charges:  OT General Charges $OT Visit: 1 Visit OT Evaluation $OT Eval Moderate Complexity: 1 Mod OT Treatments $Self Care/Home Management : 8-22 mins  Ebony Hail Harold Hedge) Marsa Aris OTR/L Acute Rehabilitation Services Pager: [phone] Office:  [place] 02/16/2019, 2:02 PM

## 2019-02-16 NOTE — Progress Notes (Signed)
Per request of PCP, warfarin has been discontinued and DOAC therapy has been initiated for her atrial fibrillation.  We will start patient on Eliquis 5 mg twice daily when INR is <2, likely 02/17/2019.  Abigail Jera Headings, DO PGY-2, [facility]

## 2019-02-16 NOTE — Progress Notes (Signed)
ANTICOAGULATION CONSULT NOTE - Initial Consult  Pharmacy Consult for Warfarin Indication: atrial fibrillation  Allergies  Allergen Reactions  . Lisinopril     REACTION: bad dreams    Patient Measurements: Height: 5\' 3"  (160 cm) Weight: 263 lb 0.1 oz (119.3 kg) IBW/kg (Calculated) : 52.4  Vital Signs: Temp: 98.7 F (37.1 C) (05/14 0448) Temp Source: Oral (05/14 0448) BP: 118/72 (05/14 0448) Pulse Rate: 105 (05/14 0448)  Labs: Recent Labs    02/15/19 1027 02/15/19 2051 02/16/19 0257 02/16/19 0812  HGB 10.6*  --  10.4*  --   HCT 35.5*  --  33.3*  --   PLT 422*  --  400  --   LABPROT 25.7* 26.4*  --  28.6*  INR 2.4* 2.5*  --  2.7*  CREATININE 2.06*  --  1.74*  --   CKTOTAL 189  --   --   --   TROPONINI <0.03  --   --   --     Estimated Creatinine Clearance: 34.9 mL/min (A) (by C-G formula based on SCr of 1.74 mg/dL (H)).   Medical History: Past Medical History:  Diagnosis Date  . Chronic pain syndrome 07/15/2018  . Degenerative arthritis of knee 04/2001   by xray  . Diabetic peripheral neuropathy ([place]) 10/23/2013  . Diabetic peripheral neuropathy ([place]) 10/23/2013  . Impaired mobility 05/21/2017  . Kidney stone on right side 05/12/2016   [place] 05/2016 - non-obstructing right 6 mm stone  . Obstructive sleep apnea 05/19/2018   Sleep study showed some apnea and nighttime desaturations. However, did not meet criteria for CPAP.  Marland Kitchen Right knee pain 07/23/2014  . Sciatica of left side 12/2003   DDD L5,S1   . Secondary hyperparathyroidism ([place]) 05/31/2017  . Spinal stenosis, lumbar 04/2004   mild on MRI at L 4-5   Assessment: Pt is a 77yoF on warfarin PTA for atrial fibrillation. INR on admission 2.4. INR today 2.7. PTA dose: warfarin 2.5 mg daily xc 5 mg on M/W/F.   Goal of Therapy:  INR 2-3 Monitor platelets by anticoagulation protocol: Yes   Plan:  Warfarin 2.5 mg x1 Daily INR, monitor for signs/symptoms of bleeding  Anastasya Jewell A. Levada Dy, PharmD, [place] Please utilize Amion for appropriate phone number to reach the unit pharmacist ([place])   02/16/2019 9:31 AM

## 2019-02-16 NOTE — Progress Notes (Signed)
Family Medicine Teaching Service Daily Progress Note Intern Pager: [phone]  Patient name: Abigail Duncan Medical record number: [id number] Date of birth: 06/16/1941 Age: 78 y.o. Gender: female  Primary Care Provider: McDiarmid, Blane Ohara, MD Consultants: SW, CM Code Status: Partial Code   Pt Overview and Major Events to Date:  Hospital Day: 2 02/15/2019: admitted for Fatigue and Leg Pain   Assessment and Plan: Abigail Duncan is a 78 y.o. female who presented w/ fatigue, leg pain, generalized weakness and found to have a UTI.  PMH is significant for anemia, HTN, A. fib on Coumadin, CKD stage IV, chronic pain syndrome, NIDDM2 with peripheral neuropathy, HLD, morbid obesity, OSA, secondary hyperparathyroidism, lumbar stenosis, impaired mobility, degenerative arthritis of left knee.  #Generalized pain and weakness #UTI Patient reports that this started 2 weeks ago.  She reports that she came into the hospital because "she could not do anything at home." Can consider infection, patient did have UTI on admission, but not concerning for pyelonephritis.   Also will need to rule out PMR and GCA given elevated ESR and CRP.  Patient denies any jaw claudication, headache, or temporal tenderness.  On exam, patient does not have any temporal tenderness. As patient with 2 weeks acute on chronic pain and weakness w/ elevated ESR and CRP, will start low dose treatment of steroids for PMR.  F/u sensitivities   Continue home 5-325 Norco q 8 hours PRN   12.5 prednisone daily   #UTI White count 10.8 with ANC 7.8.  Urine s/f >100,000 GNR.  IV ceftriaxone (5/13- ), day 2   #A. fib on Coumadin In A. fib on admission.  Continue home metoprolol 168m BID  Coumadin per pharmacy  Follow up Echo  Telemetry  #HTN Normotensive.  Holding home K. Dur 20 mEq daily.  Also on metolazone 2.5 mg Monday Wednesday Friday.  Holding home enalapril 10 mg daily.  Continue home furosemide 40 mg daily, metoprolol 100  mg twice daily, daily  #NIDDM 2 Holding home metformin 1000 mg in the morning and 500 mg at night.  Continuing home rosuvastatin 20 mg.  CBG 4 times daily  #Chronic pain syndrome #Difficult mobility Patient lives at home with her son, who is 522years old, she reports that he is her caretaker.  On admission, there is concern that patient's son cannot continue care as his mom has many medical issues.  Continue home Norco every 8 hours as needed  #CKD 4 Creatinine baseline with large range, averaging 1.45 over the last two years. On admission Cr 1.74.  GFR 28 at baseline  Avoid nephrotoxic drugs   #Pedal edema Stable during exam today.  Continue Lasix as above  Work with PT OT  #Anemia, iron deficiency, chronic disease  Continue home iron  #HLD  Continue home rosuvastatin 10 mg  #Vitamin deficiency  Continue home vitamin B12 daily  #FEN/GI:  . Fluids: With IV CTX . Electrolytes: wnl  . Nutrition: HH   Access: R PIV 02/15/19 VTE prophylaxis: Chronically Anticoagulated   Disposition: Pending PT/OT recommendations     Subjective:  NAEO.   Objective: Temp:  [97.7 F (36.5 C)-98.9 F (37.2 C)] 98.7 F (37.1 C) (05/14 0448) Pulse Rate:  [58-133] 105 (05/14 0448) Cardiac Rhythm: Sinus tachycardia (05/13 1912) Resp:  [16-28] 16 (05/14 0448) BP: (83-139)/(59-97) 118/72 (05/14 0448) SpO2:  [85 %-98 %] 93 % (05/14 0448) Weight:  [119.3 kg-136.1 kg] 119.3 kg (05/14 0448) 05/13 0701 - 05/14 0700 In: 843.2 [P.O.:240; IV  Piggyback:603.2] Out: 301 [Urine:300; Stool:1]   Physical Exam: General: NAD, non-toxic, well-appearing, sitting in bed, resting on left side.   Cardiovascular: irregular, normal S1, S2. 2+ RP bilaterally. Trace BLEE Respiratory: CTAB. No IWOB.  Abdomen: Obese, + BS. NT, ND, soft to palpation.  Extremities: Warm and well perfused. Moving spontaneously.  Neuro: Alert & oriented x 4. CN grossly intact.   Laboratory: I have personally read and  reviewed all labs and imaging studies.  CBC: Recent Labs  Lab 02/15/19 1027 02/16/19 0257  WBC 12.9* 10.8*  NEUTROABS 10.3* 7.8*  HGB 10.6* 10.4*  HCT 35.5* 33.3*  MCV 85.1 82.2  PLT 422* 400   CMP: Recent Labs  Lab 02/15/19 1027 02/16/19 0257  NA 135 138  K 4.5 4.0  CL 95* 99  CO2 21* 27  GLUCOSE 167* 153*  BUN 49* 41*  CREATININE 2.06* 1.74*  CALCIUM 9.2 8.9  ALBUMIN 2.9*  --      CBG: Recent Labs  Lab 02/15/19 2302  GLUCAP 161*    Imaging/Diagnostic Tests: Dg Chest Port 1 View IMPRESSION: Similar appearance of chronic lung changes without evidence of acute cardiopulmonary disease    EKG Interpretation  Date/Time:  Wednesday Feb 15 2019 10:27:10 EDT Ventricular Rate:  106 PR Interval:    QRS Duration: 80 QT Interval:  293 QTC Calculation: 389 R Axis:   42 Text Interpretation:  Atrial fibrillation Nonspecific repol abnormality, diffuse leads increased rate, otherwise similar to prior 4/10 Confirmed by Aletta Edouard [phone]) on 02/15/2019 10:42:51 AM Also confirmed by Aletta Edouard [phone]), editor Philomena Doheny [phone])  on 02/15/2019 3:55:01 PM        Wilber Oliphant, MD 02/16/2019, 6:16 AM PGY-1, [place] Intern pager: [phone], text pages welcome

## 2019-02-16 NOTE — Discharge Summary (Signed)
[facility] Discharge Summary  Patient name: Abigail Duncan Medical record number: [id number] Date of birth: 04-08-1941 Age: 78 y.o. Gender: female Date of Admission: 02/15/2019  Date of Discharge: 02/21/2019 Admitting Physician: Zenia Resides, MD  Primary Care Provider: McDiarmid, Blane Ohara, MD Consultants: none  Indication for Hospitalization: generalized pain and weakness  Discharge Diagnoses/Problem List:  Polymyalgia rheumatica Generalized pain and weakness Atrial fibrillation Hypertension Type 2 diabetes Chronic pain syndrome Pedal edema CKD, stage 4 Chronic anemia HLD  Disposition: SNF  Discharge Condition: Improved, stable  Discharge Exam:  General: pleasant elderly woman, well nourished, well developed, in no acute distress with non-toxic appearance, lying comfortably in bed HEENT: normocephalic, atraumatic, moist mucous membranes Neck: supple, normal ROM CV:  irregularly irregular, regular rate, trace LE edema Lungs: clear to auscultation bilaterally with normal work of breathing Abdomen: soft, non-tender, non-distended, normoactive bowel sounds Skin: warm, dry Extremities: warm and well perfused Neuro: Alert and oriented, speech normal  Brief Hospital Course:  Patient is a 78 year old female with past medical history significant for A. fib, now on Eliquis (transition from warfarin during this admission), hypertension, type 2 diabetes, chronic pain syndrome, CKD 4, who presented with acute on chronic generalized weakness and pain.  Prior to admission, patient reported not being able to do anything at home. On admission, patient was found to have pansensitive E. coli UTI which was treated with Keflex.  Initial labs were also significant for elevated ESR and CRP.  Patient denied any jaw claudication and temporal tenderness. Given patient's age and generalized weakness and pain that was concentrated at her hips, patient was empirically started  on low-dose prednisone 12.5 mg to treat for polymyalgia rheumatica.  Patient's status showed slow improvement in the first couple days of low-dose prednisone.   Her primary caretaker was her 42 year-old son, who reported that he was unable to provide her healthcare needs.  He requested that patient go to rehabilitation until she was better able to care for herself and not requiring as much care.  Patient agreed to SNF placement and was discharged to [facility].  Of note, on admission, PCP requested that patient be changed to Eliquis from Coumadin for A. fib.  Pharmacy recommended to wait until patient's INR was less than 2 to start Eliquis. Eliquis 95m BID was started on 02/20/19.   Issues for Follow Up:  1. Discontinued warfarin and started on Eliquis 515mBID per PCP, Dr. McDiarmid 2. Begin prednisone taper to tolerable dose in 3-4 weeks  3. Please continue to monitor diabetes closely given Prednisone. Patient was discharged on 10U Lantus for better blood sugar control while on Prednisone.  4. Metolazone held during admission. Patient remained euvolemic on home Lasix 4025mD. Consider restarting if indicated 5. Gabapentin decreased from 900 to 300m61mD given adequate response while inpatient. Discussed with patient. Agreed to discharge on lower dose with understanding of increasing if symptoms worsen.   Significant Procedures: None  Significant Labs and Imaging:  Recent Labs  Lab 02/18/19 0244 02/19/19 0255 02/20/19 0342  WBC 10.2 11.3* 14.0*  HGB 10.3* 10.3* 11.0*  HCT 32.9* 32.9* 34.6*  PLT 417* 442* 487*   Recent Labs  Lab 02/15/19 1027 02/16/19 0257 02/17/19 0251 02/18/19 0244 02/19/19 0255 02/20/19 0342  NA 135 138 136 135 136 134*  K 4.5 4.0 4.4 3.8 3.8 3.7  CL 95* 99 96* 96* 94* 94*  CO2 21* '27 27 26 29 28  ' GLUCOSE 167* 153* 193* 174*  168* 160*  BUN 49* 41* 36* 42* 45* 43*  CREATININE 2.06* 1.74* 1.65* 1.66* 1.56* 1.47*  CALCIUM 9.2 8.9 8.9 9.0 8.9 8.9  PHOS  --   --    --   --  3.4  --   ALKPHOS 57  --   --   --   --   --   AST 20  --   --   --   --   --   ALT 15  --   --   --   --   --   ALBUMIN 2.9*  --   --   --  2.5*  --    Lactic Acid: 2.0>1.5 CK: 189 ESR: 75 CRP: 12.6 COVID: negative Urine Culture: >100,000 colonies of E.coli, Pansensitive Blood culture: NG x 5 days  Urinalysis    Component Value Date/Time   COLORURINE YELLOW 02/15/2019 1028   APPEARANCEUR TURBID (A) 02/15/2019 1028   LABSPEC 1.015 02/15/2019 1028   PHURINE 5.5 02/15/2019 1028   GLUCOSEU NEGATIVE 02/15/2019 1028   HGBUR TRACE (A) 02/15/2019 1028   BILIRUBINUR NEGATIVE 02/15/2019 1028   BILIRUBINUR small 06/16/2011 1713   KETONESUR NEGATIVE 02/15/2019 1028   PROTEINUR NEGATIVE 02/15/2019 1028   UROBILINOGEN 1.0 06/16/2011 1713   NITRITE POSITIVE (A) 02/15/2019 1028   LEUKOCYTESUR SMALL (A) 02/15/2019 1028    Results/Tests Pending at Time of Discharge: None  Discharge Medications:  Allergies as of 02/21/2019      Reactions   Lisinopril    REACTION: bad dreams      Medication List    STOP taking these medications   calcium-vitamin D 500-200 MG-UNIT tablet Commonly known as:  OSCAL WITH D   metolazone 2.5 MG tablet Commonly known as:  ZAROXOLYN   warfarin 5 MG tablet Commonly known as:  COUMADIN     TAKE these medications   apixaban 5 MG Tabs tablet Commonly known as:  ELIQUIS Take 1 tablet (5 mg total) by mouth 2 (two) times daily.   cephALEXin 500 MG capsule Commonly known as:  KEFLEX Take 1 capsule (500 mg total) by mouth once for 1 dose. At 2200   enalapril 10 MG tablet Commonly known as:  VASOTEC TAKE 1 TABLET BY MOUTH EVERY DAY   ferrous sulfate 325 (65 FE) MG tablet Take 325 mg by mouth 3 (three) times daily with meals.   fluticasone 50 MCG/ACT nasal spray Commonly known as:  FLONASE SPRAY 2 SPRAYS INTO EACH NOSTRIL EVERY DAY What changed:  See the new instructions.   furosemide 40 MG tablet Commonly known as:  LASIX TAKE 1  TABLET BY MOUTH TWICE A DAY What changed:  when to take this   gabapentin 300 MG capsule Commonly known as:  NEURONTIN Take 1 capsule (300 mg total) by mouth 3 (three) times daily. What changed:  how much to take   HYDROcodone-acetaminophen 5-325 MG tablet Commonly known as:  NORCO/VICODIN Take 1 tablet by mouth every 8 (eight) hours as needed for moderate pain. What changed:  Another medication with the same name was removed. Continue taking this medication, and follow the directions you see here.   insulin glargine 100 UNIT/ML injection Commonly known as:  LANTUS Inject 0.1 mLs (10 Units total) into the skin daily. Start taking on:  Feb 22, 2019   Klor-Con M20 20 MEQ tablet Generic drug:  potassium chloride SA TAKE 1 TABLET BY MOUTH EVERY DAY What changed:  how much to take   metFORMIN 500 MG  tablet Commonly known as:  GLUCOPHAGE TAKE 2 TABLETS BY MOUTH EVERY MORNING AND 1 TABLET EVERY EVENING What changed:    how much to take  how to take this  when to take this   metoprolol tartrate 100 MG tablet Commonly known as:  LOPRESSOR TAKE 1 TABLET (100 MG TOTAL) 2 (TWO) TIMES DAILY BY MOUTH.   nitroGLYCERIN 0.4 MG SL tablet Commonly known as:  Nitrostat PLACE 1 TABLET (0.4 MG TOTAL) UNDER THE TONGUE EVERY 5 (FIVE) MINUTES AS NEEDED FOR CHEST PAIN.   predniSONE 2.5 MG tablet Commonly known as:  DELTASONE Take 5 tablets (12.5 mg total) by mouth daily with breakfast.   rosuvastatin 20 MG tablet Commonly known as:  CRESTOR Take 0.5 tablets (10 mg total) by mouth daily.   vitamin B-12 1000 MCG tablet Commonly known as:  CYANOCOBALAMIN Take 1,000 mcg by mouth daily.   Vitamin D (Ergocalciferol) 1.25 MG (50000 UT) Caps capsule Commonly known as:  DRISDOL Take 50,000 Units by mouth once a week.       Discharge Instructions: Please refer to Patient Instructions section of EMR for full details.  Patient was counseled important signs and symptoms that should prompt  return to medical care, changes in medications, dietary instructions, activity restrictions, and follow up appointments.   Follow-Up Appointments:  Contact information for follow-up providers    Steve Rattler, DO. Go on 02/24/2019.   Specialty:  Family Medicine Why:  at 3:10pm for hospital follow up Contact information: [place] [address] [phone]            Contact information for after-discharge care    Destination    [facility] Preferred SNF .   Service:  Skilled Nursing Contact information: [address] [place] [address] [place], [place], DO 02/21/2019, 12:17 PM PGY-1, [place]

## 2019-02-16 NOTE — Progress Notes (Signed)
Abigail Duncan 78 y o female patient admitted from ED. patient is alert and oriented x3. Vital signs are stable. Iv in place. Skin assessment done with another nurse. No any distress noted. Medicine given for pain and schedule as well. Patient is on telemetry. Patient given instructions about call bell and phone. Bed in low position and call bell in reach.

## 2019-02-16 NOTE — Evaluation (Addendum)
Physical Therapy Evaluation Patient Details Name: Abigail Duncan MRN: [id number] DOB: 1941-02-10 Today's Date: 02/16/2019   History of Present Illness  78 y.o. female admitted on 02/15/19 for generalized pain and weakness.  R/o UTI and medical workup pending.  Pt with PMH of lumbar spinal stenosis, sciatica L side, R knee pain, and diabetic peripheral neuopathy.  Clinical Impression  Limited bed level evaluation as pt is reporting significant "all over" pain awaiting pain medication and lunch arrived.  She did recently work with OT (see OT notes) and needed significant assist to mobilize EOB and unsuccessfully attempt to stand with steady standing frame).  Two person assist to mobilize next session.  She will most likely need post acute rehab at discharge.   PT to follow acutely for deficits listed below.    Follow Up Recommendations SNF    Equipment Recommendations  None recommended by PT    Recommendations for Other Services   NA    Precautions / Restrictions Precautions Precautions: Fall Restrictions Weight Bearing Restrictions: No      Mobility                   General transfer comment: Pt's lunch just arrived.  See OT notes for details.   Ambulation/Gait             General Gait Details: Pt was ambulatory with RW PTA               Pertinent Vitals/Pain Pain Assessment: Faces Faces Pain Scale: Hurts whole lot Pain Location: generalized all over pain Pain Descriptors / Indicators: Grimacing;Guarding(screaming) Pain Intervention(s): Limited activity within patient's tolerance;Monitored during session;Repositioned    Home Living Family/patient expects to be discharged to:: Unsure Living Arrangements: Children(with son who works) Available Help at Discharge: Family;Available PRN/intermittently(son works late afternoon) Type of Home: Mobile home Home Access: Level entry;Ramped entrance     Home Layout: One level Home Equipment: Wheelchair -  [employer] - 4 wheels;Cane - single point;Cane - quad;Shower seat;Grab bars - tub/shower;Hand held shower head;Bedside commode Additional Comments: Does not have any HH services or aides    Prior Function Level of Independence: Needs assistance   Gait / Transfers Assistance Needed: used rollator for mobility, has had three falls in 3 weeks.    ADL's / Homemaking Assistance Needed: cooking, cleaning  by son; pt unable put socks on (she does have sock aid for assist) otherwise able to perform bathing/dress/groom   Comments: Pt reports three falls in the past 3 weeks and EMS had to be called to get her up off of the ground.         Extremity/Trunk Assessment   Upper Extremity Assessment Upper Extremity Assessment: Defer to OT evaluation    Lower Extremity Assessment Lower Extremity Assessment: RLE deficits/detail;LLE deficits/detail RLE Deficits / Details: right leg is weaker than left. She is unable to lift either leg against gravity or even move in gravity eliminated positions.   RLE Sensation: WNL LLE Deficits / Details: Pt is able to grossly move left leg, attempt rotating and bending at the knee, this is slightly better than her right leg, but she is still unable to lift L leg against gravity either.  LLE Sensation: WNL    Cervical / Trunk Assessment Cervical / Trunk Assessment: Kyphotic  Communication   Communication: No difficulties  Cognition Arousal/Alertness: Awake/alert Behavior During Therapy: WFL for tasks assessed/performed Overall Cognitive Status: No family/caregiver present to determine baseline cognitive functioning  General Comments: General conversation was normal. Reported same home layout details to me that she did to OT earlier.              Assessment/Plan    PT Assessment Patient needs continued PT services  PT Problem List Decreased strength;Decreased range of motion;Decreased activity  tolerance;Decreased mobility;Decreased balance;Decreased knowledge of use of DME;Decreased knowledge of precautions;Obesity;Pain       PT Treatment Interventions DME instruction;Gait training;Functional mobility training;Therapeutic activities;Therapeutic exercise;Balance training;Patient/family education;Wheelchair mobility training;Modalities    PT Goals (Current goals can be found in the Care Plan section)  Acute Rehab PT Goals Patient Stated Goal: wants to figure out what is going on with her and decrease pain PT Goal Formulation: With patient Time For Goal Achievement: 03/02/19 Potential to Achieve Goals: Good    Frequency Min 2X/week   Barriers to discharge Decreased caregiver support         AM-PAC PT "6 Clicks" Mobility  Outcome Measure Help needed turning from your back to your side while in a flat bed without using bedrails?: Total Help needed moving from lying on your back to sitting on the side of a flat bed without using bedrails?: Total Help needed moving to and from a bed to a chair (including a wheelchair)?: Total Help needed standing up from a chair using your arms (e.g., wheelchair or bedside chair)?: Total Help needed to walk in hospital room?: Total Help needed climbing 3-5 steps with a railing? : Total 6 Click Score: 6    End of Session   Activity Tolerance: Patient limited by pain Patient left: in bed;Other (comment)(with HOB up to eat.)   PT Visit Diagnosis: Muscle weakness (generalized) (M62.81);Difficulty in walking, not elsewhere classified (R26.2);History of falling (Z91.81);Pain Pain - Right/Left: (generalized) Pain - part of body: (generalized)    Time: 5929-2446 PT Time Calculation (min) (ACUTE ONLY): 12 min   Charges:        Wells Guiles B. Lacoya Wilbanks, PT, DPT  Acute Rehabilitation #[phone] pager #[phone] office   PT Evaluation $PT Eval Moderate Complexity: 1 Mod         02/16/2019, 11:50 AM

## 2019-02-16 NOTE — NC FL2 (Signed)
[place] LEVEL OF CARE SCREENING TOOL     IDENTIFICATION  Patient Name: Abigail Duncan Birthdate: 1941-07-29 Sex: female Admission Date (Current Location): 02/15/2019  [facility] and [place] Number:  Herbalist and Address:  The . [facility], [place] [address] [place], [place] [address]      Provider Number: [id number]  Attending Physician Name and Address:  Zenia Resides, MD  Relative Name and Phone Number:  [name]; son; [phone]    Current Level of Care: Hospital Recommended Level of Care: [place] Prior Approval Number:    Date Approved/Denied:   PASRR Number: [id number]  Discharge Plan: SNF    Current Diagnoses: Patient Active Problem List   Diagnosis Date Noted  . Generalized weakness 02/15/2019  . Iron deficiency anemia 10/28/2018  . Vitamin B12 deficiency 10/28/2018  . Thrombocytosis ([place]) 10/28/2018  . Proud flesh 09/09/2018  . Chronic pain syndrome 07/15/2018  . Stage 3b chronic kidney disease ([place]) 07/15/2018  . Anticoagulation goal of INR 2 to 3 07/15/2018  . Gait difficulty 07/15/2018  . Atrophic kidney, Right 05/20/2018  . Obstructive sleep apnea 05/19/2018    Class: Chronic  . Normocytic anemia 05/19/2018  . Diabetic retinopathy ([place]) 03/11/2018  . Chronic painful diabetic neuropathy ([place]) 09/10/2017  . Secondary hyperparathyroidism ([place]) 05/31/2017  . Anemia of chronic disease 05/31/2017  . Impaired mobility 05/21/2017  . Vitamin D deficiency 01/25/2017  . Shortness of breath 10/16/2016  . Restless leg syndrome 09/19/2015  . Pulmonary hypertension, moderate to severe ([place]) 06/14/2015  . Leg swelling 06/14/2015  . Osteopenia 05/09/2015  . Hypertension associated with chronic kidney disease due to type 2 diabetes mellitus ([place]) 04/01/2015  . Encounter for chronic pain management 12/18/2014  . Preventative health care 01/26/2014  . Long term current use of anticoagulant  therapy 11/15/2010  . Atrial fibrillation, permanent 01/22/2009  . ALLERGIC RHINITIS, SEASONAL 01/08/2009  . Type II diabetes mellitus with neurological manifestations ([place]) 12/02/2006  . Pure hypercholesterolemia 12/02/2006  . Morbid obesity ([place]) with obstructive sleep apnea 12/02/2006  . Coronary atherosclerosis 12/02/2006  . Osteoarthritis of multiple joints 12/02/2006  . DDD (degenerative disc disease), lumbar 12/02/2006  . INCONTINENCE, URGE 12/02/2006    Orientation RESPIRATION BLADDER Height & Weight     Self, Time, Place  Normal Incontinent, External catheter Weight: 263 lb 0.1 oz (119.3 kg) Height:  5\' 3"  (160 cm)  BEHAVIORAL SYMPTOMS/MOOD NEUROLOGICAL BOWEL NUTRITION STATUS      Continent Diet  AMBULATORY STATUS COMMUNICATION OF NEEDS Skin   Extensive Assist Verbally Skin abrasions, Other (Comment)(abrasion on left hand with foam; generalized ecchymosis on hands; MASD on groin)                       Personal Care Assistance Level of Assistance  Bathing, Feeding, Dressing Bathing Assistance: Maximum assistance Feeding assistance: Limited assistance Dressing Assistance: Maximum assistance     Functional Limitations Info  Sight, Hearing, Speech Sight Info: Impaired Hearing Info: Adequate Speech Info: Adequate    SPECIAL CARE FACTORS FREQUENCY  PT (By licensed PT), OT (By licensed OT)     PT Frequency: 5x week OT Frequency: 5x week            Contractures Contractures Info: Not present    Additional Factors Info  Insulin Sliding Scale, Allergies, Code Status Code Status Info: Partial Allergies Info: Lisinopril   Insulin Sliding Scale Info: insulin aspart (novoLOG) injection 0-9 Units 3x daily  with meals       Current Medications (02/16/2019):  This is the current hospital active medication list Current Facility-Administered Medications  Medication Dose Route Frequency Provider Last Rate Last Dose  . cefTRIAXone (ROCEPHIN) 1 g in sodium chloride  0.9 % 100 mL IVPB  1 g Intravenous Q24H Maia Breslow C, MD 200 mL/hr at 02/16/19 1201 1 g at 02/16/19 1201  . ferrous sulfate tablet 325 mg  325 mg Oral TID WC Kathrene Alu, MD   325 mg at 02/16/19 1200  . furosemide (LASIX) tablet 40 mg  40 mg Oral Daily Kathrene Alu, MD   40 mg at 02/16/19 0835  . gabapentin (NEURONTIN) capsule 300 mg  300 mg Oral TID Kathrene Alu, MD   300 mg at 02/16/19 0835  . HYDROcodone-acetaminophen (NORCO/VICODIN) 5-325 MG per tablet 0.5 tablet  0.5 tablet Oral Q6H PRN Wilber Oliphant, MD   0.5 tablet at 02/16/19 1204  . HYDROcodone-acetaminophen (NORCO/VICODIN) 5-325 MG per tablet 1 tablet  1 tablet Oral Q8H PRN Kathrene Alu, MD   1 tablet at 02/16/19 0538  . insulin aspart (novoLOG) injection 0-9 Units  0-9 Units Subcutaneous TID WC Kathrene Alu, MD   3 Units at 02/16/19 1200  . metoprolol tartrate (LOPRESSOR) tablet 100 mg  100 mg Oral BID Kathrene Alu, MD   100 mg at 02/16/19 0836  . ondansetron (ZOFRAN) tablet 4 mg  4 mg Oral Q6H PRN Mullis, Kiersten P, DO   4 mg at 02/16/19 0459  . polyethylene glycol (MIRALAX / GLYCOLAX) packet 17 g  17 g Oral Daily PRN Winfrey, Amanda C, MD      . potassium chloride SA (K-DUR) CR tablet 20 mEq  20 mEq Oral Daily Kathrene Alu, MD   20 mEq at 02/16/19 0837  . predniSONE (DELTASONE) tablet 12.5 mg  12.5 mg Oral Q breakfast Wilber Oliphant, MD      . rosuvastatin (CRESTOR) tablet 10 mg  10 mg Oral Daily Kathrene Alu, MD   10 mg at 02/16/19 0837  . vitamin B-12 (CYANOCOBALAMIN) tablet 1,000 mcg  1,000 mcg Oral Daily Kathrene Alu, MD   1,000 mcg at 02/16/19 9622     Discharge Medications: Please see discharge summary for a list of discharge medications.  Relevant Imaging Results:  Relevant Lab Results:   Additional Information SS[id number] [place], [place]

## 2019-02-17 DIAGNOSIS — M353 Polymyalgia rheumatica: Principal | ICD-10-CM

## 2019-02-17 LAB — CBC
HCT: 32.3 % — ABNORMAL LOW (ref 36.0–46.0)
Hemoglobin: 10 g/dL — ABNORMAL LOW (ref 12.0–15.0)
MCH: 25.6 pg — ABNORMAL LOW (ref 26.0–34.0)
MCHC: 31 g/dL (ref 30.0–36.0)
MCV: 82.8 fL (ref 80.0–100.0)
Platelets: 399 10*3/uL (ref 150–400)
RBC: 3.9 MIL/uL (ref 3.87–5.11)
RDW: 16.7 % — ABNORMAL HIGH (ref 11.5–15.5)
WBC: 11.2 10*3/uL — ABNORMAL HIGH (ref 4.0–10.5)
nRBC: 0 % (ref 0.0–0.2)

## 2019-02-17 LAB — URINE CULTURE
Culture: >=100000 — AB
Special Requests: NORMAL

## 2019-02-17 LAB — PROTIME-INR
INR: 2.6 — ABNORMAL HIGH (ref 0.8–1.2)
Prothrombin Time: 27.3 s — ABNORMAL HIGH (ref 11.4–15.2)

## 2019-02-17 LAB — BASIC METABOLIC PANEL WITH GFR
Anion gap: 13 (ref 5–15)
BUN: 36 mg/dL — ABNORMAL HIGH (ref 8–23)
CO2: 27 mmol/L (ref 22–32)
Calcium: 8.9 mg/dL (ref 8.9–10.3)
Chloride: 96 mmol/L — ABNORMAL LOW (ref 98–111)
Creatinine, Ser: 1.65 mg/dL — ABNORMAL HIGH (ref 0.44–1.00)
GFR calc Af Amer: 35 mL/min — ABNORMAL LOW (ref >60)
GFR calc non Af Amer: 30 mL/min — ABNORMAL LOW (ref >60)
Glucose, Bld: 193 mg/dL — ABNORMAL HIGH (ref 70–99)
Potassium: 4.4 mmol/L (ref 3.5–5.1)
Sodium: 136 mmol/L (ref 135–145)

## 2019-02-17 LAB — GLUCOSE, CAPILLARY
Glucose-Capillary: 169 mg/dL — ABNORMAL HIGH (ref 70–99)
Glucose-Capillary: 174 mg/dL — ABNORMAL HIGH (ref 70–99)
Glucose-Capillary: 275 mg/dL — ABNORMAL HIGH (ref 70–99)
Glucose-Capillary: 214 mg/dL — ABNORMAL HIGH (ref 70–99)

## 2019-02-17 MED ORDER — CEPHALEXIN 500 MG PO CAPS
500.0000 mg | ORAL_CAPSULE | Freq: Two times a day (BID) | ORAL | Status: DC
  Administered 2019-02-17 – 2019-02-21 (×9): 500 mg via ORAL
  Filled 2019-02-17 (×9): qty 1

## 2019-02-17 NOTE — Progress Notes (Signed)
Spoke to son over the phone, and he reports that her status has declined over the last month and a half.    Has not been moving around much and just eating junk food   This last week, was urinating in the bed as she couldn't make it to the bathroom. She also had pain with urination  She has been having loose stools "like diarrhea" with every bowel movement. Son reports they are dark colored and "looks like blood".   He is 160lbs and he has a hard time moving her around.   He would like her to go to a nursing facility for rehabilitation so that she can have improved mobility around the house when she returns home.   He does not think he can provide all of the care she needs as she has declined in the last month and a half.   He believes she is getting "alzheimers or something" because she repeats herself a lot.   Son would like to be kept informed about SNF placement.   Assessment and plan  Will continue with plan for SNF discharge   Urination explained with diagnosed UTI.   Hemoglobin is stable. Obtain FOBT, can consider colonoscopy or upper GI.    Zettie Cooley, M.D.  Family Medicine  PGY-1 02/17/2019 11:32 AM

## 2019-02-17 NOTE — Progress Notes (Signed)
Patient admitted to hospital for generalized weakness

## 2019-02-17 NOTE — Progress Notes (Signed)
PHARMACY NOTE:  ANTIMICROBIAL RENAL DOSAGE ADJUSTMENT  Current antimicrobial regimen includes a mismatch between antimicrobial dosage and estimated renal function.  As per policy approved by the Pharmacy & Therapeutics and Medical Executive Committees, the antimicrobial dosage will be adjusted accordingly.  Current antimicrobial dosage:  Cephalexin 500 mg PO q6h x 5 days  Indication:  UTI  Renal Function:  Estimated Creatinine Clearance: 36.2 mL/min (A) (by C-G formula based on SCr of 1.65 mg/dL (H)). []      On intermittent HD, scheduled: []      On CRRT    Antimicrobial dosage has been changed to:  Cephalexin 500 mg BID x 5 days  Additional comments:   Thank you for allowing pharmacy to be a part of this patient's care.  Arty Baumgartner, [facility]  Pager: [phone] 02/17/2019 8:29 AM

## 2019-02-17 NOTE — Progress Notes (Addendum)
Physical Therapy Treatment Patient Details Name: Abigail Duncan MRN: [id number] DOB: [birth year] Today's Date: 02/17/2019    History of Present Illness 78 y.o. female admitted on 02/15/19 for generalized pain and weakness.  New Dx of possible PMR.  Pt with PMH of lumbar spinal stenosis, sciatica L side, R knee pain, and diabetic peripheral neuopathy.    PT Comments    Pt reports 10/10 pain, "all over", LEs more painful than UEs. +2 max assist for supine to sit, pt sat at edge of bed x 10 minutes. Performed forward reaching and LE AAROM exercises for strengthening. Sit to stand not attempted 2* pain, fatigue, and LE weakness. Noted new Dx of possible PMR.    Follow Up Recommendations  SNF     Equipment Recommendations  None recommended by PT    Recommendations for Other Services       Precautions / Restrictions Precautions Precautions: Fall Restrictions Weight Bearing Restrictions: No    Mobility  Bed Mobility Overal bed mobility: Needs Assistance Bed Mobility: Supine to Sit;Sit to Supine     Supine to sit: Max assist;+2 for physical assistance Sit to supine: Max assist;+2 for physical assistance   General bed mobility comments: pt reported she was very sore from being "tossed around in bed for bathing", looped gait belt around each foot so pt to use UEs to assist with advancing LEs to edge of bed, she seemed to tolerate movement better when she had some control over it, max A of 2 to advance LEs and raise trunk, pt 30%, pt sat edge of bed x 10 minutes  Transfers                 General transfer comment: Pt unable to attempt standing due to weakness and pain  Ambulation/Gait                 Stairs             Wheelchair Mobility    Modified Rankin (Stroke Patients Only)       Balance Overall balance assessment: Needs assistance Sitting-balance support: Feet supported;Single extremity supported Sitting balance-Leahy Scale: Fair Sitting  balance - Comments: initially required min to mod A for posterior lean, then supervision for sitting EOB, sat edge of bed x 10 min, performed forward reaching, LAQs, APs in sitting                                    Cognition Arousal/Alertness: Awake/alert Behavior During Therapy: WFL for tasks assessed/performed Overall Cognitive Status: Within Functional Limits for tasks assessed                                 General Comments: Followed commands      Exercises General Exercises - Lower Extremity Ankle Circles/Pumps: AROM;10 reps;Both;Seated Long Arc Quad: AROM;10 reps;Seated;Both    General Comments        Pertinent Vitals/Pain Faces Pain Scale: Hurts worst Pain Location: generalized all over pain, BLEs more than UEs Pain Descriptors / Indicators: Grimacing;Guarding Pain Intervention(s): Limited activity within patient's tolerance;Monitored during session;Premedicated before session;Repositioned    Home Living                      Prior Function            PT Goals (current goals can  now be found in the care plan section) Acute Rehab PT Goals Patient Stated Goal: wants to figure out what is going on with her and decrease pain; likes to paint rooms in her house (was able to paint a room ~3 months ago) PT Goal Formulation: With patient Time For Goal Achievement: 03/02/19 Potential to Achieve Goals: Good Progress towards PT goals: Progressing toward goals    Frequency    Min 2X/week      PT Plan Current plan remains appropriate    Co-evaluation              AM-PAC PT "6 Clicks" Mobility   Outcome Measure  Help needed turning from your back to your side while in a flat bed without using bedrails?: Total Help needed moving from lying on your back to sitting on the side of a flat bed without using bedrails?: Total Help needed moving to and from a bed to a chair (including a wheelchair)?: Total Help needed standing up  from a chair using your arms (e.g., wheelchair or bedside chair)?: Total Help needed to walk in hospital room?: Total Help needed climbing 3-5 steps with a railing? : Total 6 Click Score: 6    End of Session   Activity Tolerance: Patient limited by fatigue;Patient limited by pain Patient left: in bed;with bed alarm set;with call bell/phone within reach Nurse Communication: Mobility status;Need for lift equipment PT Visit Diagnosis: Muscle weakness (generalized) (M62.81);Difficulty in walking, not elsewhere classified (R26.2);History of falling (Z91.81);Pain Pain - Right/Left: Right Pain - part of body: Knee;Hip;Leg     Time: 2458-0998 PT Time Calculation (min) (ACUTE ONLY): 34 min  Charges:  $Therapeutic Exercise: 8-22 mins $Therapeutic Activity: 8-22 mins                     Blondell Reveal Kistler PT 02/17/2019  Acute Rehabilitation Services Pager [phone] Office [phone]

## 2019-02-17 NOTE — Progress Notes (Addendum)
Family Medicine Teaching Service Daily Progress Note Intern Pager: [phone]  Patient name: Abigail Duncan Medical record number: [id number] Date of birth: 1941-05-16 Age: 78 y.o. Gender: female  Primary Care Provider: McDiarmid, Blane Ohara, MD Consultants: SW, CM Code Status: Partial Code   Pt Overview and Major Events to Date:  Hospital Day: 3 02/15/2019: admitted for Fatigue and Leg Pain   Assessment and Plan: Abigail Duncan is a 78 y.o. female who presented w/ fatigue, leg pain, generalized weakness and found to have a UTI.  PMH is significant for anemia, HTN, A. fib on Coumadin, CKD stage IV, chronic pain syndrome, NIDDM2 with peripheral neuropathy, HLD, morbid obesity, OSA, secondary hyperparathyroidism, lumbar stenosis, impaired mobility, degenerative arthritis of left knee.  #Acute/subacute generalized pain and weakness, possibly due to PMR #Chronic pain syndrome #Difficult mobility Started medication yesterday for PMR.  She will be on low-dose steroids and her pain and weakness will be monitored.  Pain is specifically in hips and goes distal.  Plan for patient to work with PT and OT today for possible SNF placement as son is unable to take care of at home.  There is also likely a component of deconditioning, obesity.  Echo with 60 to 65% ejection fraction and likely not cardiac cause causing acute/subacute weakness. PT and OT recs for SNF.   Continue home 5-325 Norco q 8 hours PRN   12.5 prednisone daily   F/U CSW for SNF placement   #UTI Leukocytosis 11.2, up from 10.4 yesterday. Urine s/f >100,000 GNR, pansenstive.  Will discontinue IV ceftriaxone (5/13-5/14).  Will start renally dosed Keflex p.o. 4 times daily for remaining 7 days of treatment.   IV CTX 5/13-5/14  500mg  BID PO Keflex, day 1 of 5 (5/15-5/9)    #A. fib on Coumadin Heart rate ranging from 71-1 09. PCP requests that patient be transitioned to [place] while inpatient. Will await INR <2. Today, 2.6.   Continue  home metoprolol 100mg  BID  Coumadin per pharmacy  Telemetry  Start eliquis when INR <2  #HTN Normotensive, last blood pressure 131/91. Also on metolazone 2.5 mg MWF.  Echo results as above.  Continue home furosemide 40 mg daily, metoprolol 100 mg twice daily, daily  Monitor potassium, holding home K-Dur 20 mEq daily  Monitor creatinine, holding enalapril 2 mg daily   Monitor fluid status, holding metolazone 2.5 mg MWF  #Non IDDM 2 Holding home metformin 1000 mg in the morning and 500 mg at night.  Continuing home rosuvastatin 20 mg.  Blood sugars ranging from 1 69-2 46.  CBG 4 times daily  Sensitive sliding scale  #CKD 4 Creatinine baseline with large range, averaging 1.45 over the last two years.  Creatinine 1.65 improved from 1.74 yesterday.  Follows w/ [facility]  Avoid nephrotoxic drugs   #Pedal edema No edema on exam today  Continue Lasix as above  Consider metolazone if worsening  Work with PT OT  #Anemia, normocytic, iron deficiency, chronic disease  Continue home iron  #HLD  Continue home rosuvastatin 10 mg  #Vitamin deficiency  Continue home vitamin B12 daily  #FEN/GI:  . Electrolytes: wnl  . Nutrition: HH   Access: R PIV 02/15/19 VTE prophylaxis: Chronically Anticoagulated   Disposition: Pending PT/OT recommendations     Subjective:  NAEO.   Objective: Temp:  [98.2 F (36.8 C)-99 F (37.2 C)] 98.2 F (36.8 C) (05/15 0552) Pulse Rate:  [71-109] 71 (05/15 0552) Cardiac Rhythm: Atrial fibrillation (05/15 0219) Resp:  [19-20] 20 (05/14 2205)  BP: (121-136)/(88-91) 131/91 (05/15 0552) SpO2:  [91 %-94 %] 93 % (05/15 0552) Weight:  [115.8 kg] 115.8 kg (05/15 0500) 05/14 0701 - 05/15 0700 In: 400 [P.O.:400] Out: 900 [Urine:900]   Physical Exam: General: NAD, non-toxic, well-appearing, sitting up in bed.   Cardiovascular: RRR, normal S1, S2. 2+ RP bilaterally. Trace BLEE Respiratory: CTAB. No IWOB.  Abdomen: + BS. NT, ND, soft to palpation.   Extremities: Warm and well perfused. Moving spontaneously.  Neuro: Alert & oriented x 4. CN grossly intact.   Laboratory: I have personally read and reviewed all labs and imaging studies.  CBC: Recent Labs  Lab 02/15/19 1027 02/16/19 0257 02/17/19 0251  WBC 12.9* 10.8* 11.2*  NEUTROABS 10.3* 7.8*  --   HGB 10.6* 10.4* 10.0*  HCT 35.5* 33.3* 32.3*  MCV 85.1 82.2 82.8  PLT 422* 400 399   CMP: Recent Labs  Lab 02/15/19 1027 02/16/19 0257 02/17/19 0251  NA 135 138 136  K 4.5 4.0 4.4  CL 95* 99 96*  CO2 21* 27 27  GLUCOSE 167* 153* 193*  BUN 49* 41* 36*  CREATININE 2.06* 1.74* 1.65*  CALCIUM 9.2 8.9 8.9  ALBUMIN 2.9*  --   --      CBG: Recent Labs  Lab 02/15/19 2302 02/16/19 0741 02/16/19 1139 02/16/19 1733 02/16/19 2208  GLUCAP 161* 202* 248* 169* 246*    Imaging/Diagnostic Tests: Dg Chest Port 1 View IMPRESSION: Similar appearance of chronic lung changes without evidence of acute cardiopulmonary disease     Wilber Oliphant, MD 02/17/2019, 6:10 AM PGY-1, [place] Intern pager: [phone], text pages welcome

## 2019-02-18 LAB — CBC
HCT: 32.9 % — ABNORMAL LOW (ref 36.0–46.0)
Hemoglobin: 10.3 g/dL — ABNORMAL LOW (ref 12.0–15.0)
MCH: 25.7 pg — ABNORMAL LOW (ref 26.0–34.0)
MCHC: 31.3 g/dL (ref 30.0–36.0)
MCV: 82 fL (ref 80.0–100.0)
Platelets: 417 10*3/uL — ABNORMAL HIGH (ref 150–400)
RBC: 4.01 MIL/uL (ref 3.87–5.11)
RDW: 16.4 % — ABNORMAL HIGH (ref 11.5–15.5)
WBC: 10.2 10*3/uL (ref 4.0–10.5)
nRBC: 0.2 % (ref 0.0–0.2)

## 2019-02-18 LAB — PROTIME-INR
INR: 2.5 — ABNORMAL HIGH (ref 0.8–1.2)
Prothrombin Time: 26.6 seconds — ABNORMAL HIGH (ref 11.4–15.2)

## 2019-02-18 LAB — BASIC METABOLIC PANEL
Anion gap: 13 (ref 5–15)
BUN: 42 mg/dL — ABNORMAL HIGH (ref 8–23)
CO2: 26 mmol/L (ref 22–32)
Calcium: 9 mg/dL (ref 8.9–10.3)
Chloride: 96 mmol/L — ABNORMAL LOW (ref 98–111)
Creatinine, Ser: 1.66 mg/dL — ABNORMAL HIGH (ref 0.44–1.00)
GFR calc Af Amer: 35 mL/min — ABNORMAL LOW (ref >60)
GFR calc non Af Amer: 30 mL/min — ABNORMAL LOW (ref >60)
Glucose, Bld: 174 mg/dL — ABNORMAL HIGH (ref 70–99)
Potassium: 3.8 mmol/L (ref 3.5–5.1)
Sodium: 135 mmol/L (ref 135–145)

## 2019-02-18 LAB — GLUCOSE, CAPILLARY
Glucose-Capillary: 308 mg/dL — ABNORMAL HIGH (ref 70–99)
Glucose-Capillary: 274 mg/dL — ABNORMAL HIGH (ref 70–99)
Glucose-Capillary: 272 mg/dL — ABNORMAL HIGH (ref 70–99)
Glucose-Capillary: 241 mg/dL — ABNORMAL HIGH (ref 70–99)

## 2019-02-18 MED ORDER — INSULIN GLARGINE 100 UNIT/ML ~~LOC~~ SOLN
7.0000 [IU] | Freq: Every day | SUBCUTANEOUS | Status: DC
  Administered 2019-02-18 – 2019-02-20 (×3): 7 [IU] via SUBCUTANEOUS
  Filled 2019-02-18 (×4): qty 0.07

## 2019-02-18 NOTE — Progress Notes (Signed)
Family Medicine Teaching Service Daily Progress Note Intern Pager: [phone]  Patient name: Abigail Duncan Medical record number: [id number] Date of birth: Nov 17, 1940 Age: 78 y.o. Gender: female  Primary Care Provider: McDiarmid, Blane Ohara, MD Consultants: SW, CM Code Status: Partial Code   Pt Overview and Major Events to Date:  Hospital Day: 4 02/15/2019: admitted for Fatigue and Leg Pain  Assessment and Plan: Abigail Duncan is a 78 y.o. female who presented w/ fatigue, leg pain, generalized weakness and found to have a UTI.  PMH is significant for anemia, HTN, A. fib on Coumadin, CKD stage IV, chronic pain syndrome, NIDDM2 with peripheral neuropathy, HLD, morbid obesity, OSA, secondary hyperparathyroidism, lumbar stenosis, impaired mobility, degenerative arthritis of left knee.  #Acute/subacute generalized pain and weakness, possibly due to PMR #Chronic pain syndrome #Difficult mobility Still with generalized pain and weakness, unable to characterize if improved. Continues on prednisone. Currently, patient is medically stable for discharge and is awaiting SNF placement.  Continue home 5-325 Norco q 8 hours PRN   Continue 12.5 prednisone daily   F/U CSW for SNF placement   #UTI Pansensitive E. coli.  Good UOP overnight. No dysuria.  500mg  BID PO Keflex, day 3 of 5 (5/15-5/9)    #Non IDDM 2 Holding home metformin 1000 mg in the morning and 500 mg at night.  Continuing home rosuvastatin 20 mg.  Blood sugars ranging from 168-308.  Continues on Lantus in setting of prednisone therapy.  Patient required 17 units of NovoLog in the last 24 hours.  CBG 4 times daily  Lantus 7 units, consider increasing dose  Plan to resume metformin on d/c  #A. fib on Coumadin INR 2.1 today.  Plan to switch back to coumadin when INR<2.0.  Rate controlled.  Continue home metoprolol 100mg  BID  #HTN Mildly hypertensive this am. On enalapril, furosemide, metoprolol, and metolazone 2.5 mg MWF at home.     Continue home furosemide 40 mg daily, metoprolol 100 mg twice daily  Monitor potassium, holding home K-Dur 20 mEq daily  Monitor creatinine, holding enalapril 2 mg daily   Monitor fluid status, holding metolazone 2.5 mg MWF  #CKD 4 Creatinine improved, at baseline.  Avoid nephrotoxic drugs   Monitor daily RFP  #Pedal edema Unable to appreciate today, patient repeatedly and adamantly refused exam.  Continue Lasix as above  Consider metolazone if worsening  Work with PT OT  #Anemia, normocytic, iron deficiency, chronic disease  Continue home iron  #HLD  Continue home rosuvastatin 10 mg  #Vitamin deficiency  Continue home vitamin B12 daily  #FEN/GI: Heart Healthy diet  Access: R PIV 02/15/19 VTE prophylaxis: Chronically Anticoagulated , Coumadin to Eliquis during this admission   Disposition: Pending SNF placement  Subjective:  Patient sleeping initially on exam, awakens but grumpy. Repeatedly and adamantly refusing physical exam. Denies dysuria. Is unsure if her pain and weakness is changed from prior.  Objective: Temp:  [97.8 F (36.6 C)-98.4 F (36.9 C)] 97.8 F (36.6 C) (05/16 2134) Pulse Rate:  [90-104] 90 (05/16 2134) Resp:  [18-20] 18 (05/16 2134) BP: (128-135)/(74-82) 135/74 (05/16 2134) SpO2:  [93 %-94 %] 94 % (05/16 2134) 05/15 0701 - 05/16 0700 In: 120 [P.O.:120] Out: 300 [Urine:300]   Physical Exam:  General: obese female, resting comfortably in bed, in NAD Cardiovascular: regular rate Respiratory: normal WOB on RA. No resp distress  Neuro: alert and oriented  Laboratory: I have personally read and reviewed all labs and imaging studies.  CBC: Recent Labs  Lab  02/15/19 1027 02/16/19 0257 02/17/19 0251 02/18/19 0244  WBC 12.9* 10.8* 11.2* 10.2  NEUTROABS 10.3* 7.8*  --   --   HGB 10.6* 10.4* 10.0* 10.3*  HCT 35.5* 33.3* 32.3* 32.9*  MCV 85.1 82.2 82.8 82.0  PLT 422* 400 399 417*   CMP: Recent Labs  Lab 02/15/19 1027  02/16/19 0257 02/17/19 0251 02/18/19 0244  NA 135 138 136 135  K 4.5 4.0 4.4 3.8  CL 95* 99 96* 96*  CO2 21* 27 27 26   GLUCOSE 167* 153* 193* 174*  BUN 49* 41* 36* 42*  CREATININE 2.06* 1.74* 1.65* 1.66*  CALCIUM 9.2 8.9 8.9 9.0  ALBUMIN 2.9*  --   --   --      CBG: Recent Labs  Lab 02/17/19 2146 02/18/19 0851 02/18/19 1210 02/18/19 1701 02/18/19 2132  GLUCAP 214* 308* 274* 272* 241*    Imaging/Diagnostic Tests: Dg Chest Port 1 View IMPRESSION: Similar appearance of chronic lung changes without evidence of acute cardiopulmonary disease    Rory Percy, DO 02/18/2019, 11:36 PM PGY-2, [place] Intern pager: [phone], text pages welcome

## 2019-02-18 NOTE — TOC Initial Note (Signed)
Transition of Care [facility]) - Initial/Assessment Note    Patient Details  Name: Abigail Duncan MRN: [id number] Date of Birth: 04/07/1941  [facility]) CM/SW Contact:    Oretha Milch, LCSW Phone Number: 02/18/2019, 4:37 PM  Clinical Narrative: CSW met with patient to discuss referral to SNF facility. CSW noted patient was apprehensive to go to a SNF but was open to being referred as she wanted out of the hospital. CSW noted patient stated she was open to providers in [facility]. CSW noted patient's request to leave as soon as possible. SW reviewed the SNF process with the patient and informed her it may be Monday by the time patient received insurance authorization. CSW noted patient requested to be updated Sunday.               Expected Discharge Plan: Skilled Nursing Facility Barriers to Discharge: Insurance Authorization   Patient Goals and CMS Choice Patient states their goals for this hospitalization and ongoing recovery are:: "I just want to go back home." CMS Medicare.gov Compare Post Acute Care list provided to:: Patient Choice offered to / list presented to : Patient  Expected Discharge Plan and Services Expected Discharge Plan: [place] In-house Referral: Clinical Social Work Discharge Planning Services: CM Consult   Living arrangements for the past 2 months: [place] Expected Discharge Date: (TBD)                         HH Arranged: RN, PT [place] Agency: [place] ([place]) Date [place]: 02/15/19 Time [place]: [place] Representative spoke with at [place]: Janae Sauce  Prior Living Arrangements/Services Living arrangements for the past 2 months: [place] with:: Adult Children Patient language and need for interpreter reviewed:: No Do you feel safe going back to the place where you live?: Yes      Need for Family Participation in Patient Care: No (Comment) Care giver  support system in place?: Yes (comment)(Patient reports she lives with her son that helps provide her care)   Criminal Activity/Legal Involvement Pertinent to Current Situation/Hospitalization: No - Comment as needed  Activities of Daily Living      Permission Sought/Granted Permission sought to share information with : Chartered certified accountant granted to share information with : Yes, Verbal Permission Granted  Share Information with NAME: Referral facilities  Permission granted to share info w AGENCY: SNF facilities        Emotional Assessment Appearance:: Appears stated age Attitude/Demeanor/Rapport: Apprehensive, Guarded Affect (typically observed): Apprehensive, Appropriate, Defensive Orientation: : Oriented to Self, Oriented to Place, Oriented to  Time, Oriented to Situation Alcohol / Substance Use: Not Applicable Psych Involvement: No (comment)  Admission diagnosis:  Lower urinary tract infectious disease [N39.0] Weakness [R53.1] Arthralgia of multiple sites [M25.50] Patient Active Problem List   Diagnosis Date Noted  . Polymyalgia rheumatica ([place])   . Arthralgia of multiple sites   . Lower urinary tract infectious disease   . Elevated sed rate   . Weakness 02/15/2019  . Iron deficiency anemia 10/28/2018  . Vitamin B12 deficiency 10/28/2018  . Thrombocytosis ([place]) 10/28/2018  . [place]oud [place] 09/09/2018  . Chronic pain syndrome 07/15/2018  . Stage 3b chronic kidney disease ([place]) 07/15/2018  . Anticoagulation goal of INR 2 to 3 07/15/2018  . Gait difficulty 07/15/2018  . Atrophic kidney, Right 05/20/2018  . Obstructive sleep apnea 05/19/2018    Class: Chronic  .  Normocytic anemia 05/19/2018  . Diabetic retinopathy ([place]) 03/11/2018  . Chronic painful diabetic neuropathy ([place]) 09/10/2017  . Secondary hyperparathyroidism ([place]) 05/31/2017  . Anemia of chronic disease 05/31/2017  . Impaired mobility 05/21/2017  . Vitamin D deficiency 01/25/2017  .  Shortness of breath 10/16/2016  . Restless leg syndrome 09/19/2015  . Pulmonary hypertension, moderate to severe ([place]) 06/14/2015  . Leg swelling 06/14/2015  . Osteopenia 05/09/2015  . Hypertension associated with chronic kidney disease due to type 2 diabetes mellitus ([place]) 04/01/2015  . Encounter for chronic pain management 12/18/2014  . Preventative health care 01/26/2014  . Long term current use of anticoagulant therapy 11/15/2010  . Atrial fibrillation, permanent 01/22/2009  . ALLERGIC RHINITIS, SEASONAL 01/08/2009  . Type II diabetes mellitus with neurological manifestations ([place]) 12/02/2006  . Pure hypercholesterolemia 12/02/2006  . Morbid obesity ([place]) with obstructive sleep apnea 12/02/2006  . Coronary atherosclerosis 12/02/2006  . Osteoarthritis of multiple joints 12/02/2006  . DDD (degenerative disc disease), lumbar 12/02/2006  . INCONTINENCE, URGE 12/02/2006   PCP:  McDiarmid, Blane Ohara, MD Pharmacy:   [id number] [place] [id number]: [phone]: [phone] [place] [address] [place]Phone: [phone]: [phone]    Social Determinants of Health (SDOH) Interventions    Readmission Risk Interventions No flowsheet data found.

## 2019-02-18 NOTE — Progress Notes (Signed)
Family Medicine Teaching Service Daily Progress Note Intern Pager: [phone]  Patient name: Abigail Duncan Medical record number: [id number] Date of birth: 12-30-1940 Age: 78 y.o. Gender: female  Primary Care Provider: McDiarmid, Blane Ohara, MD Consultants: SW, CM Code Status: Partial Code   Pt Overview and Major Events to Date:  Hospital Day: 4 02/15/2019: admitted for Fatigue and Leg Pain   Assessment and Plan: GRACIELA Duncan is a 78 y.o. female who presented w/ fatigue, leg pain, generalized weakness and found to have a UTI.  PMH is significant for anemia, HTN, A. fib on Coumadin, CKD stage IV, chronic pain syndrome, NIDDM2 with peripheral neuropathy, HLD, morbid obesity, OSA, secondary hyperparathyroidism, lumbar stenosis, impaired mobility, degenerative arthritis of left knee.  #Acute/subacute generalized pain and weakness, possibly due to PMR #Chronic pain syndrome #Difficult mobility Patient feels mildly improved from yesterday.  We will plan to continue 12 and half milligrams prednisone daily.  Currently, patient is medically stable for discharge and is awaiting SNF placement.  Continue home 5-325 Norco q 8 hours PRN   Continue 12.5 prednisone daily   F/U CSW for SNF placement   #UTI Pansensitive E. coli.  Patient does not have dysuria.  500mg  BID PO Keflex, day 2 of 5 (5/15-5/9)    #Non IDDM 2 Holding home metformin 1000 mg in the morning and 500 mg at night.  Continuing home rosuvastatin 20 mg.  Blood sugars ranging from 174-275.  Will start daily Lantus in setting of chronic prednisone therapy.  Patient required 16 units of NovoLog in the last 24 hours.  CBG 4 times daily  Lantus 7 units   #A. fib on Coumadin INR 2.1 today.  Will switch to go back when INR<2.0.  Telemetry discontinued per attending.  Continue home metoprolol 100mg  BID  #HTN Normotensive, 128/76. Also on metolazone 2.5 mg MWF.    Continue home furosemide 40 mg daily, metoprolol 100 mg twice  daily, daily  Monitor potassium, holding home K-Dur 20 mEq daily  Monitor creatinine, holding enalapril 2 mg daily   Monitor fluid status, holding metolazone 2.5 mg MWF  #CKD 4 Creatinine at baseline.  Avoid nephrotoxic drugs   Monitor daily RFP  #Pedal edema No edema on exam today  Continue Lasix as above  Consider metolazone if worsening  Work with PT OT  #Anemia, normocytic, iron deficiency, chronic disease  Continue home iron  #HLD  Continue home rosuvastatin 10 mg  #Vitamin deficiency  Continue home vitamin B12 daily  #FEN/GI:  . Electrolytes: wnl holding home daily K-Dur . Nutrition: HH   Access: R PIV 02/15/19 VTE prophylaxis: Chronically Anticoagulated , Coumadin to Eliquis during this admsiion   Disposition: Pending SNF placement   Subjective:  NAEO.   Objective: Temp:  [97.8 F (36.6 C)-98.4 F (36.9 C)] 98.4 F (36.9 C) (05/16 0609) Pulse Rate:  [91-97] 91 (05/16 0609) Resp:  [18-20] 18 (05/16 0609) BP: (106-129)/(76-89) 128/76 (05/16 0609) SpO2:  [93 %-94 %] 94 % (05/16 0609) 05/15 0701 - 05/16 0700 In: 120 [P.O.:120] Out: 300 [Urine:300]   Physical Exam: General: NAD, non-toxic, well-appearing, sitting comfortably in bed. Appears brighter and more alert today.    Cardiovascular: irregular. Heart sounds distance. trace BLEE Respiratory: CTAB. No IWOB.  Abdomen: + BS. NT, ND, soft to palpation.  Extremities: Warm and well perfused. Moving spontaneously.  Neuro: Alert & oriented x 4. CN grossly intact.   Laboratory: I have personally read and reviewed all labs and imaging studies.  CBC: Recent  Labs  Lab 02/15/19 1027 02/16/19 0257 02/17/19 0251 02/18/19 0244  WBC 12.9* 10.8* 11.2* 10.2  NEUTROABS 10.3* 7.8*  --   --   HGB 10.6* 10.4* 10.0* 10.3*  HCT 35.5* 33.3* 32.3* 32.9*  MCV 85.1 82.2 82.8 82.0  PLT 422* 400 399 417*   CMP: Recent Labs  Lab 02/15/19 1027 02/16/19 0257 02/17/19 0251 02/18/19 0244  NA 135 138 136  135  K 4.5 4.0 4.4 3.8  CL 95* 99 96* 96*  CO2 21* 27 27 26   GLUCOSE 167* 153* 193* 174*  BUN 49* 41* 36* 42*  CREATININE 2.06* 1.74* 1.65* 1.66*  CALCIUM 9.2 8.9 8.9 9.0  ALBUMIN 2.9*  --   --   --      CBG: Recent Labs  Lab 02/16/19 2208 02/17/19 0751 02/17/19 1145 02/17/19 1718 02/17/19 2146  GLUCAP 246* 169* 174* 275* 214*    Imaging/Diagnostic Tests: Dg Chest Port 1 View IMPRESSION: Similar appearance of chronic lung changes without evidence of acute cardiopulmonary disease     Wilber Oliphant, MD 02/18/2019, 8:33 AM PGY-1, [place] Intern pager: [phone], text pages welcome

## 2019-02-19 LAB — RENAL FUNCTION PANEL
Albumin: 2.5 g/dL — ABNORMAL LOW (ref 3.5–5.0)
Anion gap: 13 (ref 5–15)
BUN: 45 mg/dL — ABNORMAL HIGH (ref 8–23)
CO2: 29 mmol/L (ref 22–32)
Calcium: 8.9 mg/dL (ref 8.9–10.3)
Chloride: 94 mmol/L — ABNORMAL LOW (ref 98–111)
Creatinine, Ser: 1.56 mg/dL — ABNORMAL HIGH (ref 0.44–1.00)
GFR calc Af Amer: 37 mL/min — ABNORMAL LOW (ref >60)
GFR calc non Af Amer: 32 mL/min — ABNORMAL LOW (ref >60)
Glucose, Bld: 168 mg/dL — ABNORMAL HIGH (ref 70–99)
Phosphorus: 3.4 mg/dL (ref 2.5–4.6)
Potassium: 3.8 mmol/L (ref 3.5–5.1)
Sodium: 136 mmol/L (ref 135–145)

## 2019-02-19 LAB — CBC
HCT: 32.9 % — ABNORMAL LOW (ref 36.0–46.0)
Hemoglobin: 10.3 g/dL — ABNORMAL LOW (ref 12.0–15.0)
MCH: 25.6 pg — ABNORMAL LOW (ref 26.0–34.0)
MCHC: 31.3 g/dL (ref 30.0–36.0)
MCV: 81.6 fL (ref 80.0–100.0)
Platelets: 442 10*3/uL — ABNORMAL HIGH (ref 150–400)
RBC: 4.03 MIL/uL (ref 3.87–5.11)
RDW: 16.3 % — ABNORMAL HIGH (ref 11.5–15.5)
WBC: 11.3 10*3/uL — ABNORMAL HIGH (ref 4.0–10.5)
nRBC: 0.3 % — ABNORMAL HIGH (ref 0.0–0.2)

## 2019-02-19 LAB — PROTIME-INR
INR: 2.1 — ABNORMAL HIGH (ref 0.8–1.2)
Prothrombin Time: 23.1 seconds — ABNORMAL HIGH (ref 11.4–15.2)

## 2019-02-19 LAB — GLUCOSE, CAPILLARY
Glucose-Capillary: 308 mg/dL — ABNORMAL HIGH (ref 70–99)
Glucose-Capillary: 234 mg/dL — ABNORMAL HIGH (ref 70–99)
Glucose-Capillary: 280 mg/dL — ABNORMAL HIGH (ref 70–99)
Glucose-Capillary: 161 mg/dL — ABNORMAL HIGH (ref 70–99)

## 2019-02-19 MED ORDER — OXYCODONE HCL 5 MG PO TABS
5.0000 mg | ORAL_TABLET | Freq: Once | ORAL | Status: AC
  Administered 2019-02-19: 5 mg via ORAL
  Filled 2019-02-19: qty 1

## 2019-02-19 MED ORDER — METFORMIN HCL 500 MG PO TABS
500.0000 mg | ORAL_TABLET | Freq: Every day | ORAL | Status: DC
  Administered 2019-02-19 – 2019-02-21 (×3): 500 mg via ORAL
  Filled 2019-02-19 (×3): qty 1

## 2019-02-19 MED ORDER — METFORMIN HCL 500 MG PO TABS
1000.0000 mg | ORAL_TABLET | Freq: Every day | ORAL | Status: DC
  Administered 2019-02-20 – 2019-02-21 (×2): 1000 mg via ORAL
  Filled 2019-02-19 (×2): qty 2

## 2019-02-19 NOTE — Progress Notes (Signed)
Family Medicine Teaching Service Daily Progress Note Intern Pager: [phone]  Patient name: Abigail Duncan Medical record number: [id number] Date of birth: 1941/02/23 Age: 78 y.o. Gender: female  Primary Care Provider: McDiarmid, Blane Ohara, MD Consultants: SW, CM Code Status: Partial Code   Pt Overview and Major Events to Date:  Hospital Day: 6 02/15/2019: admitted for Fatigue and Leg Pain  Assessment and Plan: [name] is a 78 y.o. female who presented w/ fatigue, leg pain, generalized weakness and found to have a UTI.  PMH is significant for anemia, HTN, A. fib on Coumadin, CKD stage IV, chronic pain syndrome, NIDDM2 with peripheral neuropathy, HLD, morbid obesity, OSA, secondary hyperparathyroidism, lumbar stenosis, impaired mobility, degenerative arthritis of left knee.  #Acute/subacute generalized pain and weakness, possibly due to PMR #Chronic pain syndrome #Difficult mobility Still with generalized pain and weakness, unable to characterize if improved. Improved with prednisone. Continues on prednisone. Plans to work with PT/OT today. Notes primary weakness was her LE, however she ambulates minimally at home. She primarily gets around her home with scooter and only ambulates to transfer. Patient is medically stable for discharge and awaiting SNF placement. [facility] [facility] [place] if able. - Continue home 5-325 Norco q 8 hours PRN  - Continue Prednisone 12.5mg  QD  - Follow up CSW for SNF placement   #UTI: improving Pansensitive E. coli.  Good UOP overnight. No dysuria. - Continue Keflex PO 500mg  BID, day 4 of 5 (5/15-5/9)    #Non IDDM 2 Home meds: Metformin 1000mg  qAM, 500mg  qHS, Rosuvastatin 10mg  QD. On Lantus for better sugar coverage while on prednisone. CBGs ON 160's. Patient required 9 units of NovoLog in the last 24 hours. - Continue home Metformin - Continue home Rosuvastatin     - Continue 7U Lantus    #A. fib on Coumadin EKG with  known A-fib and HR 88. INR 1.7 today.  Plan to switch to Eliquis now that INR<2.0.  Rate controlled. Denies any chest pain, SOB. - Continue home metoprolol 100mg  BID - Begin Eliquis 5mg  BID  #HTN BP ON 129/69. Home meds: Enalapril 10mg  QD, Furosemide 40mg  QD, Metoprolol 100mg  BID, and Metolazone 2.5mg  MWF at home. Appears euvolemic on exam. - Continue home furosemide 40 mg daily, metoprolol 100 mg twice daily - Monitor potassium, continue home K-Dur 20 mEq daily - Monitor creatinine, holding home enalapril   - Monitor fluid status, holding metolazone 2.5 mg MWF  #CKD 4 Creatinine improved and continuing to downtrend. Cr 1.47 this AM. - Avoid nephrotoxic drugs  - Monitor daily RFP  #Pedal edema No pedal edema appreciated. - Continue home Lasix 40mg  QD - Consider metolazone if worsening - Work with PT/OT  #Anemia, normocytic, iron deficiency, chronic disease - Continue home iron  #HLD - Continue home rosuvastatin 10 mg  #Vitamin deficiency - Continue home vitamin B12 daily  #FEN/GI: Heart Healthy diet  Access: R PIV 02/15/19 VTE prophylaxis: Chronically Anticoagulated  - Begin Eliquis to[place]  Disposition: Pending SNF placement  Subjective:  Patient sleeping initially on exam, awakens and pleasant this AM. Notes her weakness has improved but has not been up yet with PT. Plans to work with PT today. Denies any CP, SOB. Notes she uses scooter around house at baseline with minimal ambulation, just to transfer from bed to chair. Tolerating PO. Denies any nausea or vomiting. Is not excited about SNF.  [facility]  Objective: Temp:  [97.6 F (36.4 C)-98 F (  36.7 C)] 97.9 F (36.6 C) (05/18 0452) Pulse Rate:  [84-90] 90 (05/18 0452) Cardiac Rhythm: Atrial fibrillation (05/18 0000) Resp:  [18] 18 (05/18 0452) BP: (129-153)/(56-69) 129/69 (05/18 0452) SpO2:  [96 %-97 %] 97 % (05/18 0452) Weight:  [112.2 kg] 112.2 kg (05/18 0452) 05/17 0701 - 05/18  0700 In: 480 [P.O.:480] Out: 760 [Urine:760]   Physical Exam:  General: obese caucasian female, well nourished, well developed, in no acute distress with non-toxic appearance, resting comfortably in bed Neck: supple, normal ROM CV: irregularly irregular, regular rate, no lower extremity edema  Lungs: clear to auscultation bilaterally with normal work of breathing Abdomen: soft, non-tender, non-distended, normoactive bowel sounds Skin: warm, dry Extremities: warm and well perfused, decreased strength symmetrically bilaterally  Neuro: Alert and oriented, speech normal  Laboratory: I have personally read and reviewed all labs and imaging studies.  CBC: Recent Labs  Lab 02/15/19 1027 02/16/19 0257  02/18/19 0244 02/19/19 0255 02/20/19 0342  WBC 12.9* 10.8*   < > 10.2 11.3* 14.0*  NEUTROABS 10.3* 7.8*  --   --   --   --   HGB 10.6* 10.4*   < > 10.3* 10.3* 11.0*  HCT 35.5* 33.3*   < > 32.9* 32.9* 34.6*  MCV 85.1 82.2   < > 82.0 81.6 81.6  PLT 422* 400   < > 417* 442* 487*   < > = values in this interval not displayed.   CMP: Recent Labs  Lab 02/15/19 1027  02/18/19 0244 02/19/19 0255 02/20/19 0342  NA 135   < > 135 136 134*  K 4.5   < > 3.8 3.8 3.7  CL 95*   < > 96* 94* 94*  CO2 21*   < > 26 29 28   GLUCOSE 167*   < > 174* 168* 160*  BUN 49*   < > 42* 45* 43*  CREATININE 2.06*   < > 1.66* 1.56* 1.47*  CALCIUM 9.2   < > 9.0 8.9 8.9  PHOS  --   --   --  3.4  --   ALBUMIN 2.9*  --   --  2.5*  --    < > = values in this interval not displayed.     CBG: Recent Labs  Lab 02/19/19 0803 02/19/19 1145 02/19/19 1701 02/19/19 2146 02/20/19 0756  GLUCAP 308* 234* 280* 161* 162*    Imaging/Diagnostic Tests: Dg Chest Port 1 View IMPRESSION: Similar appearance of chronic lung changes without evidence of acute cardiopulmonary disease    Danna Hefty, DO 02/20/2019, 9:51 AM PGY-1, [place] Intern pager: [phone], text pages welcome

## 2019-02-20 ENCOUNTER — Other Ambulatory Visit: Payer: Self-pay

## 2019-02-20 LAB — CULTURE, BLOOD (ROUTINE X 2)
Culture: NO GROWTH
Culture: NO GROWTH
Special Requests: ADEQUATE

## 2019-02-20 LAB — CBC
HCT: 34.6 % — ABNORMAL LOW (ref 36.0–46.0)
Hemoglobin: 11 g/dL — ABNORMAL LOW (ref 12.0–15.0)
MCH: 25.9 pg — ABNORMAL LOW (ref 26.0–34.0)
MCHC: 31.8 g/dL (ref 30.0–36.0)
MCV: 81.6 fL (ref 80.0–100.0)
Platelets: 487 10*3/uL — ABNORMAL HIGH (ref 150–400)
RBC: 4.24 MIL/uL (ref 3.87–5.11)
RDW: 16.3 % — ABNORMAL HIGH (ref 11.5–15.5)
WBC: 14 10*3/uL — ABNORMAL HIGH (ref 4.0–10.5)
nRBC: 0.4 % — ABNORMAL HIGH (ref 0.0–0.2)

## 2019-02-20 LAB — BASIC METABOLIC PANEL
Anion gap: 12 (ref 5–15)
BUN: 43 mg/dL — ABNORMAL HIGH (ref 8–23)
CO2: 28 mmol/L (ref 22–32)
Calcium: 8.9 mg/dL (ref 8.9–10.3)
Chloride: 94 mmol/L — ABNORMAL LOW (ref 98–111)
Creatinine, Ser: 1.47 mg/dL — ABNORMAL HIGH (ref 0.44–1.00)
GFR calc Af Amer: 40 mL/min — ABNORMAL LOW (ref >60)
GFR calc non Af Amer: 35 mL/min — ABNORMAL LOW (ref >60)
Glucose, Bld: 160 mg/dL — ABNORMAL HIGH (ref 70–99)
Potassium: 3.7 mmol/L (ref 3.5–5.1)
Sodium: 134 mmol/L — ABNORMAL LOW (ref 135–145)

## 2019-02-20 LAB — PROTIME-INR
INR: 1.7 — ABNORMAL HIGH (ref 0.8–1.2)
Prothrombin Time: 19.6 seconds — ABNORMAL HIGH (ref 11.4–15.2)

## 2019-02-20 LAB — GLUCOSE, CAPILLARY
Glucose-Capillary: 162 mg/dL — ABNORMAL HIGH (ref 70–99)
Glucose-Capillary: 243 mg/dL — ABNORMAL HIGH (ref 70–99)
Glucose-Capillary: 254 mg/dL — ABNORMAL HIGH (ref 70–99)
Glucose-Capillary: 139 mg/dL — ABNORMAL HIGH (ref 70–99)

## 2019-02-20 MED ORDER — APIXABAN 5 MG PO TABS
5.0000 mg | ORAL_TABLET | Freq: Two times a day (BID) | ORAL | Status: DC
  Administered 2019-02-20 – 2019-02-21 (×3): 5 mg via ORAL
  Filled 2019-02-20 (×2): qty 1

## 2019-02-20 MED ORDER — INSULIN ASPART 100 UNIT/ML ~~LOC~~ SOLN: 10.0000 [IU] | Freq: Once | SUBCUTANEOUS | Status: DC

## 2019-02-20 MED ORDER — INSULIN GLARGINE 100 UNIT/ML ~~LOC~~ SOLN
10.0000 [IU] | Freq: Every day | SUBCUTANEOUS | Status: DC
  Filled 2019-02-20: qty 0.1

## 2019-02-20 MED ORDER — INSULIN GLARGINE 100 UNIT/ML ~~LOC~~ SOLN
10.0000 [IU] | Freq: Every day | SUBCUTANEOUS | Status: DC
  Administered 2019-02-20 – 2019-02-21 (×2): 10 [IU] via SUBCUTANEOUS
  Filled 2019-02-20 (×3): qty 0.1

## 2019-02-20 NOTE — TOC Progression Note (Addendum)
Transition of Care [facility]) - Progression Note    Patient Details  Name: Abigail Duncan MRN: [id number] Date of Birth: 01-13-1941  Transition of Care [facility]) CM/SW [place], LCSW Phone Number: 02/20/2019, 3:18 PM  Clinical Narrative:    Patient now refusing SNF and requesting home health services. She reports that she does not have a preference of agencies. List provided. She reports that she has all equipment at home and her son will be present to assist her. [facility] has accepted patient.    Expected Discharge Plan: Skilled Nursing Facility Barriers to Discharge: Insurance Authorization  Expected Discharge Plan and Services Expected Discharge Plan: [place] In-house Referral: Clinical Social Work Discharge Planning Services: CM Consult   Living arrangements for the past 2 months: [place] Expected Discharge Date: (TBD)                         HH Arranged: RN, PT [place] Agency: [place] ([place]) Date [place]: 02/15/19 Time [place]: [place] Representative spoke with at [place]: Janae Sauce   Social Determinants of Health (SDOH) Interventions    Readmission Risk Interventions No flowsheet data found.

## 2019-02-20 NOTE — TOC Progression Note (Signed)
Transition of Care [facility]) - Progression Note    Patient Details  Name: SHARIE AMORIN MRN: [id number] Date of Birth: 1941/09/01  Transition of Care [facility]) CM/SW [place], LCSW Phone Number: 02/20/2019, 1:50 PM  Clinical Narrative:    1pm-CSW presented bed offer of [place] to patient. She is accepting of the bed offer. MD aware.   9am-CSW presented patient bed offers and she reported that she did not like them and preferred to discharge home. CSW alerted her that I would check a few more facilities and meet with her again later.    Expected Discharge Plan: Skilled Nursing Facility Barriers to Discharge: Insurance Authorization  Expected Discharge Plan and Services Expected Discharge Plan: [place] In-house Referral: Clinical Social Work Discharge Planning Services: CM Consult   Living arrangements for the past 2 months: [place] Expected Discharge Date: (TBD)                         HH Arranged: RN, PT [place] Agency: [place] ([place]) Date [place]: 02/15/19 Time [place]: [place] Representative spoke with at [place]: Janae Sauce   Social Determinants of Health (SDOH) Interventions    Readmission Risk Interventions No flowsheet data found.

## 2019-02-20 NOTE — Progress Notes (Signed)
Occupational Therapy Treatment Patient Details Name: Abigail Duncan MRN: [id number] DOB: Sep 08, 1941 Today's Date: 02/20/2019    History of present illness 78 y.o. female admitted on 02/15/19 for generalized pain and weakness.  New Dx of PMR.  Pt with PMH of lumbar spinal stenosis, sciatica L side, R knee pain, and diabetic peripheral neuopathy.   OT comments  Patient pleasant and cooperative.  Seen with PT to maximize engagement in session.   Completed bed mobility with mod-max assist +2, stand pivot transfers to 3:1 commode with max assist +2 and required total assist for toileting. Cueing for safety with transfers and standing, to avoid leaning on RW using forearms. She continues to be limited by generalized weakness and decreased activity tolerance, when discussed home safety and concerns for dc home- patient verbalized agreeable to short term SNF rehab- social worker notified.  Continue to recommend SNF, as it is unsafe for her to dc home at this time.  Will follow.    Follow Up Recommendations  SNF;Supervision/Assistance - 24 hour    Equipment Recommendations  Other (comment)(TBD at next venue of care)    Recommendations for Other Services      Precautions / Restrictions Precautions Precautions: Fall Restrictions Weight Bearing Restrictions: No       Mobility Bed Mobility Overal bed mobility: Needs Assistance Bed Mobility: Supine to Sit;Sit to Sidelying;Rolling Rolling: Mod assist   Supine to sit: Mod assist;+2 for physical assistance;HOB elevated   Sit to sidelying: Max assist;+2 for safety/equipment;+2 for physical assistance General bed mobility comments: pt able to initate movement towards EOB, assist for LB and trunk to completely ascend to EOB; returned to sidelying with support for trunk and physical assistance for LEs   Transfers Overall transfer level: Needs assistance Equipment used: Rolling walker (2 wheeled) Transfers: Sit to/from [employer] Sit to Stand: Max assist;+2 physical assistance;+2 safety/equipment Stand pivot transfers: Max assist;+2 physical assistance;+2 safety/equipment       General transfer comment: max assist for sit to stand from EOB x 2 and BSC x 2, cueing for hand placement, sequencing and safety; poor tolerance and required cueng to maintain upright posture     Balance Overall balance assessment: Needs assistance Sitting-balance support: No upper extremity supported;Feet supported Sitting balance-Leahy Scale: Fair Sitting balance - Comments: maintained static sitting EOB with min guard to close supervision    Standing balance support: Bilateral upper extremity supported;During functional activity Standing balance-Leahy Scale: Poor Standing balance comment: relaint on B UE and external support, cueing to maintain upright position and not lean on RW with forearms                           ADL either performed or assessed with clinical judgement   ADL Overall ADL's : Needs assistance/impaired     Grooming: Supervision/safety;Sitting;Set up               Lower Body Dressing: Total assistance;+2 for physical assistance;+2 for safety/equipment;Sit to/from stand   Toilet Transfer: Maximal assistance;+2 for physical assistance;+2 for safety/equipment;Stand-pivot;BSC;RW   Toileting- Clothing Manipulation and Hygiene: Total assistance;+2 for physical assistance;+2 for safety/equipment;Sit to/from stand       Functional mobility during ADLs: Maximal assistance;+2 for physical assistance;+2 for safety/equipment;Rolling walker General ADL Comments: continues to be limited by generalized weakness and decreased activity tolerance     Vision       Perception     Praxis      Cognition Arousal/Alertness:  Awake/alert Behavior During Therapy: WFL for tasks assessed/performed Overall Cognitive Status: Within Functional Limits for tasks assessed                                           Exercises     Shoulder Instructions       General Comments      Pertinent Vitals/ Pain       Pain Assessment: Faces Faces Pain Scale: Hurts little more Pain Location: generalized all over pain, BLEs more than UEs Pain Descriptors / Indicators: Grimacing;Guarding Pain Intervention(s): Limited activity within patient's tolerance;Repositioned;Monitored during session  Home Living                                          Prior Functioning/Environment              Frequency  Min 2X/week        Progress Toward Goals  OT Goals(current goals can now be found in the care plan section)  Progress towards OT goals: Progressing toward goals  Acute Rehab OT Goals Patient Stated Goal: to get stronger- agreeable to go to SNF for 1 week OT Goal Formulation: With patient  Plan Discharge plan remains appropriate;Frequency remains appropriate    Co-evaluation    PT/OT/SLP Co-Evaluation/Treatment: Yes Reason for Co-Treatment: For patient/therapist safety;To address functional/ADL transfers   OT goals addressed during session: ADL's and self-care      AM-PAC OT "6 Clicks" Daily Activity     Outcome Measure   Help from another person eating meals?: None Help from another person taking care of personal grooming?: None Help from another person toileting, which includes using toliet, bedpan, or urinal?: Total Help from another person bathing (including washing, rinsing, drying)?: A Lot Help from another person to put on and taking off regular upper body clothing?: A Lot Help from another person to put on and taking off regular lower body clothing?: Total 6 Click Score: 14    End of Session Equipment Utilized During Treatment: Gait belt;Rolling walker  OT Visit Diagnosis: Unsteadiness on feet (R26.81);Muscle weakness (generalized) (M62.81)   Activity Tolerance Patient tolerated treatment well   Patient Left in bed;with call  bell/phone within reach;with bed alarm set   Nurse Communication Mobility status        Time: 5631-4970 OT Time Calculation (min): 30 min  Charges: OT General Charges $OT Visit: 1 Visit OT Treatments $Self Care/Home Management : 8-22 mins  Delight Stare, [place] Pager [phone] Office [phone]     Delight Stare 02/20/2019, 5:01 PM

## 2019-02-20 NOTE — Discharge Instructions (Signed)
You were hospitalized for generalized weakness which was thought to be due to polymyalgia rheumatica.  We started you on a steroid called prednisone which improved your symptoms.  Will be important for you to take this medicine every day as prescribed follow-up with your PCP closely.  Will be very important for you to monitor your blood sugars more closely while on the steroid. You were discharged home with home health PT/OT.   Patient you were also found to have a UTI, which was treated with antibiotics.   Please see your doctor or return to the ED if you develop pain with urination, fever, or any concerning symptoms.   Polymyalgia Rheumatica Polymyalgia rheumatica (PMR) is an inflammatory disorder that causes aching and stiffness in your muscles and joints. Sometimes, PMR leads to a more dangerous condition (temporal arteritis or giant cell arteritis), which can cause vision loss. What are the causes? The exact cause of PMR is not known. What increases the risk? This condition is more likely to develop in:  Females.  People who are 92 years of age or older.  Caucasians. What are the signs or symptoms? Pain and stiffness are the main symptoms of PMR. Symptoms may start slowly or suddenly. The symptoms:  May be worse after inactivity and in the morning.  May affect your: ? Hips, buttocks, and thighs. ? Neck, arms, and shoulders. This can make it hard to raise your arms above your head. ? Hands and wrists. Other symptoms include:  Fever.  Tiredness.  Weakness.  Decreased appetite. This may lead to weight loss. How is this diagnosed? This condition is diagnosed with a medical history and physical exam. You may need to see a health care provider who specializes in diseases of the joint, muscles, and bones (rheumatologist). You may also have tests, including:  Blood tests.  X-rays. How is this treated? PMR usually goes away without treatment, but it may take years for that to  happen. In the meantime, your health care provider may recommend low-dose steroids to help manage your symptoms of pain and stiffness. Regular exercise and rest will also help your symptoms. Follow these instructions at home:  Take over-the-counter and prescription medicines only as told by your health care provider.  Make sure to get enough rest and sleep.  Eat a healthy and nutritious diet.  Try to exercise most days of the week. Ask your health care provider what type of exercise is best for you.  Keep all follow-up visits as told by your health are provider. This is important. Contact a health care provider if:  Your symptoms are not controlled with medicine.  You have side effects from steroids. These may include: ? Weight gain. ? Swelling. ? Insomnia. ? Mood changes. ? Bruising. ? High blood sugar readings, if you have diabetes. ? Higher than normal blood pressure readings, if you monitor your blood pressure. Get help right away if:  You develop symptoms of temporal arteritis, such as: ? A change in vision. ? Severe headache. ? Scalp pain. ? Jaw pain. This information is not intended to replace advice given to you by your health care provider. Make sure you discuss any questions you have with your health care provider. Document Released: 10/29/2004 Document Revised: 02/27/2016 Document Reviewed: 04/03/2015 Elsevier Interactive Patient Education  2019 [employer].

## 2019-02-20 NOTE — Progress Notes (Signed)
Physical Therapy Treatment Patient Details Name: Abigail Duncan MRN: [id number] DOB: March 29, 1941 Today's Date: 02/20/2019    History of Present Illness 78 y.o. female admitted on 02/15/19 for generalized pain and weakness.  New Dx of PMR.  Pt with PMH of lumbar spinal stenosis, sciatica L side, R knee pain, and diabetic peripheral neuopathy.    PT Comments    Patient seen for mobility progression. Pt presents with generalized weakness and decreased activity tolerance. Pt requires +2 assist for bed mobility and functional transfer training EOB <> BSC. Continue to recommend SNF for further skilled PT services to maximize independence and safety with mobility.    Follow Up Recommendations  SNF (pt says she is agreeable to SNF prior to home)     Equipment Recommendations  None recommended by PT    Recommendations for Other Services       Precautions / Restrictions Precautions Precautions: Fall Restrictions Weight Bearing Restrictions: No    Mobility  Bed Mobility Overal bed mobility: Needs Assistance Bed Mobility: Supine to Sit;Sit to Sidelying;Rolling Rolling: Mod assist   Supine to sit: Mod assist;+2 for physical assistance;HOB elevated   Sit to sidelying: Max assist;+2 for safety/equipment;+2 for physical assistance General bed mobility comments: pt able to initate movement towards EOB, assist for LB and trunk to completely ascend to EOB; returned to sidelying with support for trunk and physical assistance for LEs   Transfers Overall transfer level: Needs assistance Equipment used: Rolling walker (2 wheeled) Transfers: Sit to/from [employer] Sit to Stand: Max assist;+2 physical assistance;+2 safety/equipment Stand pivot transfers: Max assist;+2 physical assistance;+2 safety/equipment       General transfer comment: max assist for sit to stand from EOB x 2 and BSC x 2, cueing for hand placement, sequencing and safety; poor tolerance and required cueng to  maintain upright posture   Ambulation/Gait                 Stairs             Wheelchair Mobility    Modified Rankin (Stroke Patients Only)       Balance Overall balance assessment: Needs assistance Sitting-balance support: No upper extremity supported;Feet supported Sitting balance-Leahy Scale: Fair Sitting balance - Comments: maintained static sitting EOB with min guard to close supervision    Standing balance support: Bilateral upper extremity supported;During functional activity Standing balance-Leahy Scale: Poor Standing balance comment: relaint on B UE and external support, cueing to maintain upright position and not lean on RW with forearms                            Cognition Arousal/Alertness: Awake/alert Behavior During Therapy: WFL for tasks assessed/performed Overall Cognitive Status: Within Functional Limits for tasks assessed                                        Exercises      General Comments General comments (skin integrity, edema, etc.): multiple bruises/skin tears; open skin tear on L hand      Pertinent Vitals/Pain Pain Assessment: Faces Faces Pain Scale: Hurts little more Pain Location: generalized all over pain, BLEs more than UEs Pain Descriptors / Indicators: Grimacing;Guarding Pain Intervention(s): Limited activity within patient's tolerance;Monitored during session;Repositioned    Home Living  Prior Function            PT Goals (current goals can now be found in the care plan section) Acute Rehab PT Goals Patient Stated Goal: to get stronger- agreeable to go to SNF for 1 week Progress towards PT goals: Progressing toward goals    Frequency    Min 2X/week      PT Plan Current plan remains appropriate    Co-evaluation PT/OT/SLP Co-Evaluation/Treatment: Yes Reason for Co-Treatment: For patient/therapist safety;To address functional/ADL transfers PT goals  addressed during session: Mobility/safety with mobility;Proper use of DME OT goals addressed during session: ADL's and self-care      AM-PAC PT "6 Clicks" Mobility   Outcome Measure  Help needed turning from your back to your side while in a flat bed without using bedrails?: A Lot Help needed moving from lying on your back to sitting on the side of a flat bed without using bedrails?: A Lot Help needed moving to and from a bed to a chair (including a wheelchair)?: A Lot Help needed standing up from a chair using your arms (e.g., wheelchair or bedside chair)?: A Lot Help needed to walk in hospital room?: Total Help needed climbing 3-5 steps with a railing? : Total 6 Click Score: 10    End of Session Equipment Utilized During Treatment: Gait belt Activity Tolerance: Patient tolerated treatment well Patient left: in bed;with call bell/phone within reach;with bed alarm set Nurse Communication: Mobility status;Need for lift equipment PT Visit Diagnosis: Muscle weakness (generalized) (M62.81);Difficulty in walking, not elsewhere classified (R26.2);History of falling (Z91.81);Pain Pain - Right/Left: Right Pain - part of body: Knee;Hip;Leg     Time: 5320-2334 PT Time Calculation (min) (ACUTE ONLY): 32 min  Charges:  $Gait Training: 8-22 mins                     Earney Navy, PTA Acute Rehabilitation Services Pager: [phone] Office: [phone]     Darliss Cheney 02/20/2019, 5:24 PM

## 2019-02-20 NOTE — Care Management Important Message (Signed)
Important Message  Patient Details  Name: Abigail Duncan MRN: [id number] Date of Birth: 07-02-1941   Medicare Important Message Given:  Yes    Sharin Mons, RN 02/20/2019, 9:34 AM

## 2019-02-20 NOTE — Care Management Important Message (Signed)
Important Message  Patient Details  Name: Abigail Duncan MRN: [id number] Date of Birth: 13-May-1941   Medicare Important Message Given:  Yes    Orbie Pyo 02/20/2019, 3:59 PM

## 2019-02-21 ENCOUNTER — Other Ambulatory Visit: Payer: Self-pay

## 2019-02-21 ENCOUNTER — Encounter (HOSPITAL_COMMUNITY): Payer: Self-pay | Admitting: *Deleted

## 2019-02-21 DIAGNOSIS — I509 Heart failure, unspecified: Secondary | ICD-10-CM | POA: Diagnosis not present

## 2019-02-21 DIAGNOSIS — I4821 Permanent atrial fibrillation: Secondary | ICD-10-CM | POA: Diagnosis not present

## 2019-02-21 DIAGNOSIS — R262 Difficulty in walking, not elsewhere classified: Secondary | ICD-10-CM | POA: Diagnosis not present

## 2019-02-21 DIAGNOSIS — I10 Essential (primary) hypertension: Secondary | ICD-10-CM | POA: Diagnosis not present

## 2019-02-21 DIAGNOSIS — E559 Vitamin D deficiency, unspecified: Secondary | ICD-10-CM | POA: Diagnosis not present

## 2019-02-21 DIAGNOSIS — K5900 Constipation, unspecified: Secondary | ICD-10-CM | POA: Diagnosis not present

## 2019-02-21 DIAGNOSIS — Z1159 Encounter for screening for other viral diseases: Secondary | ICD-10-CM | POA: Diagnosis not present

## 2019-02-21 DIAGNOSIS — M353 Polymyalgia rheumatica: Secondary | ICD-10-CM | POA: Diagnosis not present

## 2019-02-21 DIAGNOSIS — E119 Type 2 diabetes mellitus without complications: Secondary | ICD-10-CM | POA: Diagnosis not present

## 2019-02-21 DIAGNOSIS — I4891 Unspecified atrial fibrillation: Secondary | ICD-10-CM | POA: Diagnosis not present

## 2019-02-21 DIAGNOSIS — M6281 Muscle weakness (generalized): Secondary | ICD-10-CM | POA: Diagnosis not present

## 2019-02-21 DIAGNOSIS — D649 Anemia, unspecified: Secondary | ICD-10-CM | POA: Diagnosis not present

## 2019-02-21 DIAGNOSIS — R52 Pain, unspecified: Secondary | ICD-10-CM | POA: Diagnosis not present

## 2019-02-21 DIAGNOSIS — E785 Hyperlipidemia, unspecified: Secondary | ICD-10-CM | POA: Diagnosis not present

## 2019-02-21 DIAGNOSIS — R531 Weakness: Secondary | ICD-10-CM | POA: Diagnosis not present

## 2019-02-21 DIAGNOSIS — N184 Chronic kidney disease, stage 4 (severe): Secondary | ICD-10-CM | POA: Diagnosis not present

## 2019-02-21 DIAGNOSIS — J309 Allergic rhinitis, unspecified: Secondary | ICD-10-CM | POA: Diagnosis not present

## 2019-02-21 DIAGNOSIS — Z7401 Bed confinement status: Secondary | ICD-10-CM | POA: Diagnosis not present

## 2019-02-21 DIAGNOSIS — M2550 Pain in unspecified joint: Secondary | ICD-10-CM | POA: Diagnosis not present

## 2019-02-21 DIAGNOSIS — N390 Urinary tract infection, site not specified: Secondary | ICD-10-CM | POA: Diagnosis not present

## 2019-02-21 LAB — GLUCOSE, CAPILLARY
Glucose-Capillary: 187 mg/dL — ABNORMAL HIGH (ref 70–99)
Glucose-Capillary: 257 mg/dL — ABNORMAL HIGH (ref 70–99)

## 2019-02-21 MED ORDER — APIXABAN 5 MG PO TABS: 5.0000 mg | ORAL_TABLET | Freq: Two times a day (BID) | ORAL | 0 refills | Status: AC

## 2019-02-21 MED ORDER — CEPHALEXIN 500 MG PO CAPS: 500.0000 mg | ORAL_CAPSULE | Freq: Once | ORAL | 0 refills | Status: DC

## 2019-02-21 MED ORDER — PREDNISONE 2.5 MG PO TABS: 12.5000 mg | ORAL_TABLET | Freq: Every day | ORAL | 0 refills | Status: DC

## 2019-02-21 MED ORDER — GABAPENTIN 300 MG PO CAPS: 300.0000 mg | ORAL_CAPSULE | Freq: Three times a day (TID) | ORAL | 0 refills | Status: AC

## 2019-02-21 MED ORDER — CEPHALEXIN 500 MG PO CAPS: 500.0000 mg | ORAL_CAPSULE | Freq: Once | ORAL | 0 refills | Status: AC

## 2019-02-21 MED ORDER — GABAPENTIN 300 MG PO CAPS: 300.0000 mg | ORAL_CAPSULE | Freq: Three times a day (TID) | ORAL | 0 refills | Status: DC

## 2019-02-21 MED ORDER — INSULIN GLARGINE 100 UNIT/ML ~~LOC~~ SOLN: 10.0000 [IU] | Freq: Every day | SUBCUTANEOUS | 11 refills | Status: AC

## 2019-02-21 MED ORDER — APIXABAN 5 MG PO TABS: 5.0000 mg | ORAL_TABLET | Freq: Two times a day (BID) | ORAL | 0 refills | Status: DC

## 2019-02-21 NOTE — TOC Transition Note (Signed)
Transition of Care [facility]) - CM/SW Discharge Note   Patient Details  Name: Abigail Duncan MRN: [id number] Date of Birth: 13-Jun-1941  Transition of [facility]) CM/SW Contact:  Eileen Stanford, LCSW Phone Number: 02/21/2019, 1:50 PM   Clinical Narrative:   Clinical Social Worker facilitated patient discharge including contacting patient family and facility to confirm patient discharge plans.  Clinical information faxed to facility and family agreeable with plan.  CSW arranged ambulance transport via [facility] to [facility] (room 608).  RN to call [phone] for report prior to discharge.    Final next level of care: [place] Barriers to Discharge: No Barriers Identified   Patient Goals and CMS Choice Patient states their goals for this hospitalization and ongoing recovery are:: "I just want to go back home." CMS Medicare.gov Compare Post Acute Care list provided to:: Patient Choice offered to / list presented to : Patient  Discharge Placement              Patient chooses bed at: [facility] Patient to be transferred to facility by: [facility] Name of family member notified: Pt  Patient and family notified of of transfer: 02/21/19  Discharge Plan and Services In-house Referral: Clinical Social Work Discharge Planning Services: CM Consult            DME Arranged: N/A DME Agency: NA       HH Arranged: RN, PT, Nurse's Aide [place] Agency: [place] ([place]) Date [place]: 02/20/19 Time [place]: 1522 Representative spoke with at [facility]: [place] ([place]) Interventions     Readmission Risk Interventions No flowsheet data found.

## 2019-02-21 NOTE — Progress Notes (Signed)
Occupational Therapy Treatment Patient Details Name: Abigail Duncan MRN: [id number] DOB: [birth year] Today's Date: 02/21/2019    History of present illness 78 y.o. female admitted on 02/15/19 for generalized pain and weakness.  New Dx of PMR.  Pt with PMH of lumbar spinal stenosis, sciatica L side, R knee pain, and diabetic peripheral neuopathy.   OT comments  Pt progressing toward established goals. Pt currently required modA to progress to sit EOB. Pt able to tolerate sitting EOB for 58min to complete oral care followed by 59min of therapeutic exercises while sitting EOB. Pt verbalized agreement to go to SNF and stated her goal is "to be there one week, get stronger, and go home". Pt will continue to benefit from skilled OT services to maximize safety and independence with ADL and functional mobility. Will continue to follow acutely. Continue to recommend d/c venue below.    Follow Up Recommendations  SNF;Supervision/Assistance - 24 hour    Equipment Recommendations  3 in 1 bedside commode    Recommendations for Other Services      Precautions / Restrictions Precautions Precautions: Fall       Mobility Bed Mobility Overal bed mobility: Needs Assistance Bed Mobility: Rolling;Sidelying to Sit;Sit to Sidelying Rolling: Mod assist Sidelying to sit: Mod assist     Sit to sidelying: Mod assist General bed mobility comments: pt able to use log rolling technique with ModA to progress upright to sit EOB;assist for LB and trunk to full upright position;returned sidelying with support for trunk and physical assistance of LEs  Transfers Overall transfer level: Needs assistance               General transfer comment: assisted pt with chair pushups/bearing weight through BLE in preparation for standing x10.     Balance Overall balance assessment: Needs assistance Sitting-balance support: Single extremity supported;Feet supported Sitting balance-Leahy Scale: Poor Sitting balance -  Comments: maintained static sitting EOB with minguard to minA for stability while completing grooming task                                   ADL either performed or assessed with clinical judgement   ADL Overall ADL's : Needs assistance/impaired Eating/Feeding: Set up;Sitting   Grooming: Supervision/safety;Sitting Grooming Details (indicate cue type and reason): pt completed oral care sitting EOB;heavy reliance on single UE support  Upper Body Bathing: Moderate assistance;Sitting   Lower Body Bathing: Total assistance;Sitting/lateral leans;Sit to/from stand;+2 for physical assistance;+2 for safety/equipment                         General ADL Comments: continues to be limited by generalized weakness and decreased activity tolerance     Vision   Vision Assessment?: No apparent visual deficits   Perception     Praxis      Cognition Arousal/Alertness: Awake/alert Behavior During Therapy: WFL for tasks assessed/performed Overall Cognitive Status: Within Functional Limits for tasks assessed                                 General Comments: pt verbalized understanding of importance of SNF prior to return home;verbalized she is "willing to go"         Exercises Exercises: Other exercises Other Exercises Other Exercises: general UE exercises x10 (shoulder flexion/extension, elbow felxion/extension)   Shoulder Instructions  General Comments bruises and open skin tear on L hand     Pertinent Vitals/ Pain       Pain Assessment: Faces Faces Pain Scale: Hurts little more Pain Location: RLE and hip  Pain Descriptors / Indicators: Grimacing;Guarding Pain Intervention(s): Limited activity within patient's tolerance;Monitored during session  Home Living                                          Prior Functioning/Environment              Frequency  Min 2X/week        Progress Toward Goals  OT  Goals(current goals can now be found in the care plan section)  Progress towards OT goals: Progressing toward goals  Acute Rehab OT Goals Patient Stated Goal: to go to SNF for 1 week and then go home OT Goal Formulation: With patient Time For Goal Achievement: 03/02/19 Potential to Achieve Goals: Good ADL Goals Pt Will Perform Grooming: with modified independence;standing Pt Will Perform Upper Body Dressing: with modified independence;sitting Pt Will Transfer to Toilet: with supervision;ambulating Additional ADL Goal #1: Pt will sit EOB x15 mins for ADL tasks with minA.  Plan Discharge plan remains appropriate;Frequency remains appropriate    Co-evaluation                 AM-PAC OT "6 Clicks" Daily Activity     Outcome Measure   Help from another person eating meals?: None Help from another person taking care of personal grooming?: None Help from another person toileting, which includes using toliet, bedpan, or urinal?: Total Help from another person bathing (including washing, rinsing, drying)?: A Lot Help from another person to put on and taking off regular upper body clothing?: A Lot Help from another person to put on and taking off regular lower body clothing?: Total 6 Click Score: 14    End of Session Equipment Utilized During Treatment: Gait belt  OT Visit Diagnosis: Unsteadiness on feet (R26.81);Muscle weakness (generalized) (M62.81)   Activity Tolerance Patient tolerated treatment well   Patient Left in bed;with call bell/phone within reach;with bed alarm set   Nurse Communication Mobility status        Time: 5027-7412 OT Time Calculation (min): 24 min  Charges: OT General Charges $OT Visit: 1 Visit OT Treatments $Self Care/Home Management : 23-37 mins  [place] Office: [place] 02/21/2019, 11:45 AM

## 2019-02-21 NOTE — Progress Notes (Signed)
Pt discharged to [facility] via [facility] transit. I gave report to [facility] and then to [facility]. I had given pt AVS including scripts and discussed the instructions with her. I removed pt's IV. Pt was able to stand and pivot to the stretcher with max assist of 2. Pt in stable condition as she left.

## 2019-02-22 DIAGNOSIS — M6281 Muscle weakness (generalized): Secondary | ICD-10-CM | POA: Diagnosis not present

## 2019-02-22 DIAGNOSIS — I10 Essential (primary) hypertension: Secondary | ICD-10-CM | POA: Diagnosis not present

## 2019-02-22 DIAGNOSIS — N390 Urinary tract infection, site not specified: Secondary | ICD-10-CM | POA: Diagnosis not present

## 2019-02-22 DIAGNOSIS — I4891 Unspecified atrial fibrillation: Secondary | ICD-10-CM | POA: Diagnosis not present

## 2019-02-22 DIAGNOSIS — I509 Heart failure, unspecified: Secondary | ICD-10-CM | POA: Diagnosis not present

## 2019-02-24 ENCOUNTER — Inpatient Hospital Stay: Payer: Medicare Other | Admitting: Family Medicine

## 2019-02-24 DIAGNOSIS — I10 Essential (primary) hypertension: Secondary | ICD-10-CM | POA: Diagnosis not present

## 2019-02-24 DIAGNOSIS — I4891 Unspecified atrial fibrillation: Secondary | ICD-10-CM | POA: Diagnosis not present

## 2019-02-24 DIAGNOSIS — M6281 Muscle weakness (generalized): Secondary | ICD-10-CM | POA: Diagnosis not present

## 2019-02-24 DIAGNOSIS — N390 Urinary tract infection, site not specified: Secondary | ICD-10-CM | POA: Diagnosis not present

## 2019-02-24 DIAGNOSIS — I509 Heart failure, unspecified: Secondary | ICD-10-CM | POA: Diagnosis not present

## 2019-02-28 DIAGNOSIS — I509 Heart failure, unspecified: Secondary | ICD-10-CM | POA: Diagnosis not present

## 2019-02-28 DIAGNOSIS — M353 Polymyalgia rheumatica: Secondary | ICD-10-CM | POA: Diagnosis not present

## 2019-02-28 DIAGNOSIS — R52 Pain, unspecified: Secondary | ICD-10-CM | POA: Diagnosis not present

## 2019-02-28 DIAGNOSIS — M6281 Muscle weakness (generalized): Secondary | ICD-10-CM | POA: Diagnosis not present

## 2019-03-01 DIAGNOSIS — I10 Essential (primary) hypertension: Secondary | ICD-10-CM | POA: Diagnosis not present

## 2019-03-01 DIAGNOSIS — I4891 Unspecified atrial fibrillation: Secondary | ICD-10-CM | POA: Diagnosis not present

## 2019-03-01 DIAGNOSIS — N390 Urinary tract infection, site not specified: Secondary | ICD-10-CM | POA: Diagnosis not present

## 2019-03-01 DIAGNOSIS — M6281 Muscle weakness (generalized): Secondary | ICD-10-CM | POA: Diagnosis not present

## 2019-03-01 DIAGNOSIS — I509 Heart failure, unspecified: Secondary | ICD-10-CM | POA: Diagnosis not present

## 2019-03-02 ENCOUNTER — Ambulatory Visit: Payer: Medicare Other | Admitting: Interventional Cardiology

## 2019-03-06 DIAGNOSIS — I509 Heart failure, unspecified: Secondary | ICD-10-CM | POA: Diagnosis not present

## 2019-03-06 DIAGNOSIS — I10 Essential (primary) hypertension: Secondary | ICD-10-CM | POA: Diagnosis not present

## 2019-03-06 DIAGNOSIS — M6281 Muscle weakness (generalized): Secondary | ICD-10-CM | POA: Diagnosis not present

## 2019-03-06 DIAGNOSIS — N390 Urinary tract infection, site not specified: Secondary | ICD-10-CM | POA: Diagnosis not present

## 2019-03-06 DIAGNOSIS — I4891 Unspecified atrial fibrillation: Secondary | ICD-10-CM | POA: Diagnosis not present

## 2019-03-08 DIAGNOSIS — I4891 Unspecified atrial fibrillation: Secondary | ICD-10-CM | POA: Diagnosis not present

## 2019-03-08 DIAGNOSIS — N390 Urinary tract infection, site not specified: Secondary | ICD-10-CM | POA: Diagnosis not present

## 2019-03-08 DIAGNOSIS — I509 Heart failure, unspecified: Secondary | ICD-10-CM | POA: Diagnosis not present

## 2019-03-08 DIAGNOSIS — I10 Essential (primary) hypertension: Secondary | ICD-10-CM | POA: Diagnosis not present

## 2019-03-08 DIAGNOSIS — M6281 Muscle weakness (generalized): Secondary | ICD-10-CM | POA: Diagnosis not present

## 2019-03-20 DIAGNOSIS — I509 Heart failure, unspecified: Secondary | ICD-10-CM | POA: Diagnosis not present

## 2019-03-20 DIAGNOSIS — M6281 Muscle weakness (generalized): Secondary | ICD-10-CM | POA: Diagnosis not present

## 2019-03-20 DIAGNOSIS — R52 Pain, unspecified: Secondary | ICD-10-CM | POA: Diagnosis not present

## 2019-03-20 DIAGNOSIS — M353 Polymyalgia rheumatica: Secondary | ICD-10-CM | POA: Diagnosis not present

## 2019-03-21 DIAGNOSIS — I509 Heart failure, unspecified: Secondary | ICD-10-CM | POA: Diagnosis not present

## 2019-03-22 ENCOUNTER — Telehealth: Payer: Self-pay | Admitting: Family Medicine

## 2019-03-22 ENCOUNTER — Telehealth: Payer: Self-pay | Admitting: *Deleted

## 2019-03-22 NOTE — Telephone Encounter (Signed)
Pt has an [employer]ure prism meter

## 2019-03-22 NOTE — Telephone Encounter (Signed)
Received request from pharmacy for lancets strips and syringes.  No indication of meter and nothing on med list.   LMOVM asking to to return call.  Need to know what meter she has.  Christen Bame, CMA

## 2019-03-22 NOTE — Telephone Encounter (Signed)
Darlina Guys from [facility] is calling to inform Dr. Chauncey Cruel that they will be going to start their services with pt on Friday 03/24/19.

## 2019-03-22 NOTE — Telephone Encounter (Signed)
Will forward to MD for an FYI. Jazmin Hartsell,CMA

## 2019-03-23 ENCOUNTER — Telehealth: Payer: Self-pay

## 2019-03-23 ENCOUNTER — Telehealth (INDEPENDENT_AMBULATORY_CARE_PROVIDER_SITE_OTHER): Payer: Medicare Other | Admitting: Family Medicine

## 2019-03-23 ENCOUNTER — Other Ambulatory Visit: Payer: Self-pay

## 2019-03-23 ENCOUNTER — Encounter: Payer: Self-pay | Admitting: Family Medicine

## 2019-03-23 ENCOUNTER — Other Ambulatory Visit: Payer: Self-pay | Admitting: *Deleted

## 2019-03-23 DIAGNOSIS — E1140 Type 2 diabetes mellitus with diabetic neuropathy, unspecified: Secondary | ICD-10-CM | POA: Diagnosis not present

## 2019-03-23 DIAGNOSIS — E1122 Type 2 diabetes mellitus with diabetic chronic kidney disease: Secondary | ICD-10-CM

## 2019-03-23 DIAGNOSIS — E11319 Type 2 diabetes mellitus with unspecified diabetic retinopathy without macular edema: Secondary | ICD-10-CM

## 2019-03-23 DIAGNOSIS — D500 Iron deficiency anemia secondary to blood loss (chronic): Secondary | ICD-10-CM

## 2019-03-23 DIAGNOSIS — D473 Essential (hemorrhagic) thrombocythemia: Secondary | ICD-10-CM

## 2019-03-23 DIAGNOSIS — M51369 Other intervertebral disc degeneration, lumbar region without mention of lumbar back pain or lower extremity pain: Secondary | ICD-10-CM

## 2019-03-23 DIAGNOSIS — D75839 Thrombocytosis, unspecified: Secondary | ICD-10-CM

## 2019-03-23 DIAGNOSIS — M170 Bilateral primary osteoarthritis of knee: Secondary | ICD-10-CM | POA: Diagnosis not present

## 2019-03-23 DIAGNOSIS — M353 Polymyalgia rheumatica: Secondary | ICD-10-CM | POA: Diagnosis not present

## 2019-03-23 DIAGNOSIS — I272 Pulmonary hypertension, unspecified: Secondary | ICD-10-CM

## 2019-03-23 DIAGNOSIS — M2550 Pain in unspecified joint: Secondary | ICD-10-CM

## 2019-03-23 DIAGNOSIS — E1129 Type 2 diabetes mellitus with other diabetic kidney complication: Secondary | ICD-10-CM

## 2019-03-23 DIAGNOSIS — M5136 Other intervertebral disc degeneration, lumbar region: Secondary | ICD-10-CM

## 2019-03-23 DIAGNOSIS — Z7409 Other reduced mobility: Secondary | ICD-10-CM

## 2019-03-23 DIAGNOSIS — R809 Proteinuria, unspecified: Secondary | ICD-10-CM

## 2019-03-23 DIAGNOSIS — R531 Weakness: Secondary | ICD-10-CM

## 2019-03-23 DIAGNOSIS — I4891 Unspecified atrial fibrillation: Secondary | ICD-10-CM

## 2019-03-23 DIAGNOSIS — E8809 Other disorders of plasma-protein metabolism, not elsewhere classified: Secondary | ICD-10-CM

## 2019-03-23 DIAGNOSIS — N1832 Chronic kidney disease, stage 3b: Secondary | ICD-10-CM

## 2019-03-23 DIAGNOSIS — E1149 Type 2 diabetes mellitus with other diabetic neurological complication: Secondary | ICD-10-CM

## 2019-03-23 DIAGNOSIS — R52 Pain, unspecified: Secondary | ICD-10-CM

## 2019-03-23 HISTORY — DX: Type 2 diabetes mellitus with other diabetic kidney complication: E11.29

## 2019-03-23 HISTORY — DX: Bilateral primary osteoarthritis of knee: M17.0

## 2019-03-23 MED ORDER — PREDNISONE 5 MG PO TABS: 15.0000 mg | ORAL_TABLET | Freq: Every day | ORAL | 1 refills | Status: AC

## 2019-03-23 NOTE — Telephone Encounter (Signed)
Patient request new rx for test strips, lancets, and lancets. Please advise.   [place]

## 2019-03-23 NOTE — Progress Notes (Signed)
[place] / E- Visit   This visit type was conducted due to national recommendations for restrictions regarding the COVID-19 Pandemic (e.g. social distancing) in an effort to limit this patient's exposure and mitigate transmission in our community.  Due to her co-morbid illnesses, this patient is at least at moderate risk for complications without adequate follow up.  This format is felt to be most appropriate for this patient at this time.  All issues noted in this document were discussed and addressed.  A limited physical exam was performed with this format.   Patient consented to have visit conducted via telephone  Encounter participants: Patient: Abigail Duncan  Patient Location: Home Provider: Sherren Mocha Libbie Bartley at office  Others (if applicable): Son & primary caretaker, Abigail Duncan  Chief Complaint:  Hip pain  HPI:  Follow up outpatient visit after Hospitalization  Sources of clinical information for visit is/are patient, relative(s) and discharge summary. The Discharge Summary for the hospitalization from 02/15/19 to 02/21/19 was reviewed.  Nursing assessment for this office visit was reviewed with the patient for accuracy and revision.   HPI Principle Diagnosis requiring hospitalization: Polymyalgia Rheumatica  Brief History:   Patient is a 78 year old female with past medical history significant for A. fib, now on Eliquis (transition from warfarin during this admission), hypertension, type 2 diabetes, chronic pain syndrome, CKD 4, who presented with acute on chronic generalized weakness and pain.  Prior to admission, patient reported not being able to do anything at home. On admission, patient was found to have pansensitive E. coli UTI which was treated with Keflex.  Initial labs were also significant for elevated ESR and CRP.  Patient denied any jaw claudication and temporal tenderness. Given patient's age and generalized weakness and pain that  was concentrated at her hips, patient was empirically started on low-dose prednisone 12.5 mg to treat for polymyalgia rheumatica.  Patient's status showed slow improvement in the first couple days of low-dose prednisone.   Her primary caretaker was her 26 year-old son, who reported that he was unable to provide her healthcare needs.  He requested that patient go to rehabilitation until she was better able to care for herself and not requiring as much care.  Patient agreed to SNF placement and was discharged to [facility].  [address] [place]  [phone]  Of note, on admission, PCP requested that patient be changed to Eliquis from Coumadin for A. fib.  Pharmacy recommended to wait until patient's INR was less than 2 to start Eliquis. Eliquis 50m BID was started on 02/20/19. spital course summary:   Follow up instructions from patient's hospital healthcare providers:  1. Discontinued warfarin and started on Eliquis 542mBID per PCP, Dr. Shanessa Hodak 2. Begin prednisone taper to tolerable dose in 3-4 weeks  3. Please continue to monitor diabetes closely given Prednisone. Patient was discharged on 10U Lantus for better blood sugar control while on Prednisone.  4. Metolazone held during admission. Patient remained euvolemic on home Lasix 4016mD. Consider restarting if indicated 5. Gabapentin decreased from 900 to 300m22mD given adequate response while inpatient. Discussed with patient. Agreed to discharge on lower dose with understanding of increasing if symptoms worsen.   Important labs: ESR: 75 CRP: 12.6 Urine Culture: >100,000 colonies of E.coli, Pansensitive ---------------------------------------------------------------------------------------------------------------------- Problems since hospital discharge:  Hip Pain - Location bilateral, Right greater than left.  Worse at lateral hips, also involving buttocks and thighs Onset: befroe recent  hospitalization Alleviating  factors: Small amount of relief with HC/APAP tablets.  Interestingly, patient is not taking her prednisone for her PMR.  Aggravating Factors: worse with movement of legs, worse with prolonged witting in one position Severity or functional limitations: Unable to transfer out of bed without assistance from more than one person.  Her primary caretaker, her son Abigail Duncan, injured his ribs trying to move her out of bed two days ago.  Associated symptoms: No fever, no myalgias, no malaise, no loss of appetite Quality: baseline aching with sharp pain with movement Context: Diagnosis with PMR 02/16/19  The patient and her son and caretaker, Abigail Duncan, report that the patient is unable to transfer from bed to chair, wheelchair, commode or shower chair without assistance from two individuals.   ---------------------------------------------------------------------------------------------------------------------- ----------------------------------------------------------------------------------------------------- New medications started during hospitalization: Prednisone, Lantus, Eliquis Chronic medications stopped during hospitalization: Warfarin, Metolazone, Ca++/Vit D Patient's Medication List was updated in the EMR: No - The discharge list from the SNF is not available.  --------------------------------------------------------------------------------------------------------------------- Home Health Services: [facility], [place], starting 03/24/19 Durable Medical Equipment: no new mentioned in hospital discharge summary --------------------------------------------------------------------------------------------------------------------- ADLs Independent Needs Assistance Dependent  Bathing   x  Dressing   x  Ambulation   x  Toileting   x  Eating x     IADL Independent Needs Assistance Dependent  Cooking   x  Housework   x  Manage Medications   x  Manage the  telephone x    Shopping for food, clothes, Meds, etc   x  Use transportation   x  Manage Finances x      ROS: see hpi  Pertinent PMHx: PMR; Pulmonary Hypertension with right ventricular dysfunction; Atrial Fibrillation; DMT2; multiple site primary osteoarthritis of major joints; painful diabetic neuropathy; Chronic Pain Syndrome; Morbid Obesity > 40%; OSA; CKD 3b with proteinuria  Exam:  Respiratory: speaking in full sentence, no audible wheeze  Assessment/Plan:  No problem-specific Assessment & Plan notes found for this encounter.    COVID-19 Education: The signs and symptoms of COVID-19 were discussed with the patient and how to seek care for testing (follow up with PCP or arrange E-visit).  The importance of social distancing was discussed today.  Time spent on phone with patient: 30 minutes

## 2019-03-23 NOTE — Assessment & Plan Note (Deleted)
Urine Protein/Creatinine 350 mg/g (High)

## 2019-03-23 NOTE — Patient Outreach (Signed)
[facility]) Care Management  03/23/2019  Abigail Duncan [birth year] [id number]   Referral received from care management assistance via primary MD with concern for member not taking medications since discharge from SNF.  She was admitted to hospital on 5/13 for UTI, discharged to SNF on 5/19.  Unsure when she was discharged home from SNF.  Per chart, she has history of A-fib, HTN, DM, DDD, CKD, and OSA.  Call placed to member's preferred listed number for transition of care assessment, no answer.  HIPAA compliant voice message left.  Call then placed to listed mobile number, no answer.  HIPAA compliant voice message left.  Unsuccessful outreach letter sent, will follow up within the next 3-4 business days.  Valente David, [place], MSN [place] [phone]

## 2019-03-24 ENCOUNTER — Encounter: Payer: Self-pay | Admitting: Family Medicine

## 2019-03-24 ENCOUNTER — Other Ambulatory Visit: Payer: Self-pay | Admitting: *Deleted

## 2019-03-24 DIAGNOSIS — E876 Hypokalemia: Secondary | ICD-10-CM | POA: Diagnosis not present

## 2019-03-24 DIAGNOSIS — M353 Polymyalgia rheumatica: Secondary | ICD-10-CM | POA: Diagnosis not present

## 2019-03-24 DIAGNOSIS — I509 Heart failure, unspecified: Secondary | ICD-10-CM | POA: Diagnosis not present

## 2019-03-24 DIAGNOSIS — D509 Iron deficiency anemia, unspecified: Secondary | ICD-10-CM | POA: Diagnosis not present

## 2019-03-24 DIAGNOSIS — Z87440 Personal history of urinary (tract) infections: Secondary | ICD-10-CM | POA: Diagnosis not present

## 2019-03-24 DIAGNOSIS — R296 Repeated falls: Secondary | ICD-10-CM | POA: Diagnosis not present

## 2019-03-24 DIAGNOSIS — E785 Hyperlipidemia, unspecified: Secondary | ICD-10-CM | POA: Diagnosis not present

## 2019-03-24 DIAGNOSIS — I130 Hypertensive heart and chronic kidney disease with heart failure and stage 1 through stage 4 chronic kidney disease, or unspecified chronic kidney disease: Secondary | ICD-10-CM | POA: Diagnosis not present

## 2019-03-24 DIAGNOSIS — M150 Primary generalized (osteo)arthritis: Secondary | ICD-10-CM | POA: Diagnosis not present

## 2019-03-24 DIAGNOSIS — R52 Pain, unspecified: Secondary | ICD-10-CM | POA: Insufficient documentation

## 2019-03-24 DIAGNOSIS — E1122 Type 2 diabetes mellitus with diabetic chronic kidney disease: Secondary | ICD-10-CM | POA: Diagnosis not present

## 2019-03-24 DIAGNOSIS — I482 Chronic atrial fibrillation, unspecified: Secondary | ICD-10-CM | POA: Diagnosis not present

## 2019-03-24 DIAGNOSIS — M5136 Other intervertebral disc degeneration, lumbar region: Secondary | ICD-10-CM | POA: Diagnosis not present

## 2019-03-24 DIAGNOSIS — N184 Chronic kidney disease, stage 4 (severe): Secondary | ICD-10-CM | POA: Diagnosis not present

## 2019-03-24 DIAGNOSIS — G894 Chronic pain syndrome: Secondary | ICD-10-CM | POA: Diagnosis not present

## 2019-03-24 DIAGNOSIS — E1143 Type 2 diabetes mellitus with diabetic autonomic (poly)neuropathy: Secondary | ICD-10-CM | POA: Diagnosis not present

## 2019-03-24 DIAGNOSIS — I2510 Atherosclerotic heart disease of native coronary artery without angina pectoris: Secondary | ICD-10-CM | POA: Diagnosis not present

## 2019-03-24 DIAGNOSIS — Z7901 Long term (current) use of anticoagulants: Secondary | ICD-10-CM | POA: Diagnosis not present

## 2019-03-24 DIAGNOSIS — Z794 Long term (current) use of insulin: Secondary | ICD-10-CM | POA: Diagnosis not present

## 2019-03-24 DIAGNOSIS — E559 Vitamin D deficiency, unspecified: Secondary | ICD-10-CM | POA: Diagnosis not present

## 2019-03-24 DIAGNOSIS — E8809 Other disorders of plasma-protein metabolism, not elsewhere classified: Secondary | ICD-10-CM | POA: Insufficient documentation

## 2019-03-24 DIAGNOSIS — G4733 Obstructive sleep apnea (adult) (pediatric): Secondary | ICD-10-CM | POA: Diagnosis not present

## 2019-03-24 DIAGNOSIS — Z9181 History of falling: Secondary | ICD-10-CM | POA: Diagnosis not present

## 2019-03-24 NOTE — Assessment & Plan Note (Addendum)
Lab Results  Component Value Date   FERRITIN 15 10/27/2018   CBC Latest Ref Rng & Units 02/20/2019 02/19/2019 02/18/2019  WBC 4.0 - 10.5 K/uL 14.0(H) 11.3(H) 10.2  Hemoglobin 12.0 - 15.0 g/dL 11.0(L) 10.3(L) 10.3(L)  Hematocrit 36.0 - 46.0 % 34.6(L) 32.9(L) 32.9(L)  Platelets 150 - 400 K/uL 487(H) 694(H) 038(U)   Uncertain if patient taking Iron supplement Patient instructed to come into [facility] for A1c in ~ 4 wks for ferritin and CBC Patient needs referral back to GI for evaluation once PMR improved.

## 2019-03-24 NOTE — Assessment & Plan Note (Signed)
Established problem Uncontrolled Patient stopped taking prednisone therapy sometime after discharge from hospital 02/21/19. Patient's hip pains may be uncontrolled because of this cessation of therapy.  Plan Start Prednisone 5 mg tablets, 3 tablets daily  Patient instructed to come into [facility] for A1c in ~ 4 wks for C-Reactive Protein and ESR Decisions about reduction in prednisone dose based 2.5 mg q 4wks until hit 10 mg daily, wear start reduction to 1 mg every 4 weeks while watching for flares.  Anticipate treatment for ~ 1 yr.

## 2019-03-24 NOTE — Assessment & Plan Note (Signed)
Established problem. Stable. Gabapentin was decreased to 300 mg qhs without loss of analgesia

## 2019-03-24 NOTE — Assessment & Plan Note (Signed)
New  Patient instructed to come into [facility] for CMET in ~ 4 wks for A1c

## 2019-03-24 NOTE — Assessment & Plan Note (Addendum)
New issue    The patient and her son and caretaker, Fabiha Rougeau, report that the patient is unable to transfer from bed to chair, wheelchair, commode or shower chair without assistance from two individuals.   Ms Samyiah Halvorsen will require a manual patient lift device for transfer from bed to chair, wheelchair, commode, or shower chair.  The transfers cannot be performed independently and requires the assistance of more than one person.  Ms Cappello would be bed confined without the use of the patient lift.  Her condition is such that periodic movement is necessary to improve her condition and arrest or retard deterioration of her condition.   A manual patient lift, will be needed for 99 months. Ms Larene Ascencio requires maximum assistance for all transfers due to bilateral primary osteoarthritis of knees and of hips, polymyalgia rheumatica, generalized weakness, generalized pain, chronic pain syndrome, painful diabetic polyneuropathy, morbid obesity with obstructive sleep apnea, lumbar spondylosis, and dyspnea from pulmonary hypertension.    A canvas or nylon sling or seat for a hydraulic lift is considered medically necessary as an accessory to the patient lift.

## 2019-03-24 NOTE — Patient Outreach (Signed)
[facility]) Care Management  03/24/2019  Abigail Duncan [birth year] [id number]   Voice message received from son, [name], after missed call yesterday.  Call placed back to member/son to initiate transition of care assessment.  Member verifies her identity, this care manager introduces self and stated purpose of call.  [facility] care management services explained, she agrees to involvement.  She report she is doing "fair."  State she is and her son are living together, he provides support with transportation to MD appointments as well as preparing meals and some transfer assistance as she is unable to walk at this time.  State she uses a wheelchair but has a walker and cane.  Confirms that home health has started and she will have physical therapy.  State she feel she is able to perform her ADLs independently but her sister in law helped her today.  She is unsure if she will have aide assistance through [facility] but will ask.  Discussed community resources should she need additional support.  Member expresses some frustration regarding her current health condition, denies history of depression however PHQ9 score = 9.  She also expresses concern regarding managing bills and food resources on a fixed income.  Denies concern with being able to afford medications.  She is open to being contacted by Education officer, museum for resources.  This care manager inquired about daily monitoring of blood sugar, weights, and blood pressure.  State she is waiting for new strips and lancets (noted this has already been requested to be refilled).  Report she will restart taking blood pressure and weights daily and record readings.  State she is adherent to medication management however is unable to review medications at this time.  State they are in another room and her son is not available to bring them to her.  She will have them available for review during next outreach.  Report using pill box for  compliance.  Denies any urgent concerns at this time, advised to contact with questions.  Will continue to follow weekly for transition of care follow up.  Fall Risk  03/24/2019 09/08/2018 03/03/2018 12/09/2017 09/09/2017  Falls in the past year? 1 0 No No No  Number falls in past yr: 1 - - - -  Injury with Fall? 1 - - - -  Risk for fall due to : Impaired mobility;Impaired balance/gait - - - -  Follow up Falls prevention discussed - - - -   Depression screen [facility] 2/9 03/24/2019 09/08/2018 07/14/2018 03/03/2018 12/09/2017  Decreased Interest 0 0 0 0 0  Down, Depressed, Hopeless 3 0 - 0 0  PHQ - 2 Score 3 0 0 0 0  Altered sleeping 1 - - - -  Tired, decreased energy 1 - - - -  Change in appetite 1 - - - -  Feeling bad or failure about yourself  3 - - - -  Trouble concentrating 0 - - - -  Moving slowly or fidgety/restless 0 - - - -  Suicidal thoughts 0 - - - -  PHQ-9 Score 9 - - - -  Some recent data might be hidden   [facility] CM Care Plan Problem One     Most Recent Value  Care Plan Problem One  Risk for readmission related to decreased mobility as evidenced by recent fall and hospitalization requiring SNF for rehab  Role Documenting the Problem One  Care Management Coordinator  Care Plan for Problem One  Active  [facility]  Long Term Goal   Member will not be readmitted to hospital within the next 31 days  [facility] Long Term Goal Start Date  03/24/19  Interventions for Problem One Long Term Goal  Reviewed discharge insturctions with member.  Educated on importance of following discharge plan, including diet, follow up with MD, and home health involvement  [facility] CM Short Term Goal #1   Member will report taking all medications as prescribed over the next 4 weeks  [facility] CM Short Term Goal #1 Start Date  03/24/19  Interventions for Short Term Goal #1  Attempted to reivew medications, advised to have medications and list available for review during next outreach  [facility] CM Short Term Goal #2   Member will report no falls  over the next 4 weeks  [facility] CM Short Term Goal #2 Start Date  03/24/19  Interventions for Short Term Goal #2  Confirmed with member that home health has started and she will have PT over the next several weeks.  DME available in the home reviewed.     Valente David, [place], MSN [place] [phone]

## 2019-03-24 NOTE — Assessment & Plan Note (Signed)
Established problem. Stable.   

## 2019-03-24 NOTE — Assessment & Plan Note (Signed)
Established problem Await reports from [facility] RN visits of BPs. Goal < 130/80 given diabetic CKD with proteinuria Continue Enalapril

## 2019-03-24 NOTE — Assessment & Plan Note (Signed)
Lab Results  Component Value Date   HGBA1C 7.8 (A) 09/08/2018   Patient instructed to come into [facility] for A1c in ~ 4 wks for A1c

## 2019-03-24 NOTE — Assessment & Plan Note (Signed)
Established problem Stable. Patient changed from Warfarin anticoagulation to Apixaban HR controller: metoprolol

## 2019-03-24 NOTE — Assessment & Plan Note (Signed)
Needs diabetic eye exam at [facility]

## 2019-03-25 ENCOUNTER — Encounter: Payer: Self-pay | Admitting: *Deleted

## 2019-03-27 ENCOUNTER — Other Ambulatory Visit: Payer: Self-pay

## 2019-03-27 DIAGNOSIS — M150 Primary generalized (osteo)arthritis: Secondary | ICD-10-CM | POA: Diagnosis not present

## 2019-03-27 DIAGNOSIS — E1140 Type 2 diabetes mellitus with diabetic neuropathy, unspecified: Secondary | ICD-10-CM | POA: Diagnosis not present

## 2019-03-27 DIAGNOSIS — Z9181 History of falling: Secondary | ICD-10-CM | POA: Diagnosis not present

## 2019-03-27 DIAGNOSIS — I272 Pulmonary hypertension, unspecified: Secondary | ICD-10-CM | POA: Diagnosis not present

## 2019-03-27 DIAGNOSIS — R296 Repeated falls: Secondary | ICD-10-CM | POA: Diagnosis not present

## 2019-03-27 DIAGNOSIS — Z7901 Long term (current) use of anticoagulants: Secondary | ICD-10-CM | POA: Diagnosis not present

## 2019-03-27 DIAGNOSIS — E1143 Type 2 diabetes mellitus with diabetic autonomic (poly)neuropathy: Secondary | ICD-10-CM | POA: Diagnosis not present

## 2019-03-27 DIAGNOSIS — M5136 Other intervertebral disc degeneration, lumbar region: Secondary | ICD-10-CM | POA: Diagnosis not present

## 2019-03-27 DIAGNOSIS — D509 Iron deficiency anemia, unspecified: Secondary | ICD-10-CM | POA: Diagnosis not present

## 2019-03-27 DIAGNOSIS — E1122 Type 2 diabetes mellitus with diabetic chronic kidney disease: Secondary | ICD-10-CM | POA: Diagnosis not present

## 2019-03-27 DIAGNOSIS — E785 Hyperlipidemia, unspecified: Secondary | ICD-10-CM | POA: Diagnosis not present

## 2019-03-27 DIAGNOSIS — Z794 Long term (current) use of insulin: Secondary | ICD-10-CM | POA: Diagnosis not present

## 2019-03-27 DIAGNOSIS — G894 Chronic pain syndrome: Secondary | ICD-10-CM | POA: Diagnosis not present

## 2019-03-27 DIAGNOSIS — N184 Chronic kidney disease, stage 4 (severe): Secondary | ICD-10-CM | POA: Diagnosis not present

## 2019-03-27 DIAGNOSIS — E876 Hypokalemia: Secondary | ICD-10-CM | POA: Diagnosis not present

## 2019-03-27 DIAGNOSIS — M353 Polymyalgia rheumatica: Secondary | ICD-10-CM | POA: Diagnosis not present

## 2019-03-27 DIAGNOSIS — I130 Hypertensive heart and chronic kidney disease with heart failure and stage 1 through stage 4 chronic kidney disease, or unspecified chronic kidney disease: Secondary | ICD-10-CM | POA: Diagnosis not present

## 2019-03-27 DIAGNOSIS — Z87440 Personal history of urinary (tract) infections: Secondary | ICD-10-CM | POA: Diagnosis not present

## 2019-03-27 DIAGNOSIS — I509 Heart failure, unspecified: Secondary | ICD-10-CM | POA: Diagnosis not present

## 2019-03-27 DIAGNOSIS — I482 Chronic atrial fibrillation, unspecified: Secondary | ICD-10-CM | POA: Diagnosis not present

## 2019-03-27 DIAGNOSIS — E559 Vitamin D deficiency, unspecified: Secondary | ICD-10-CM | POA: Diagnosis not present

## 2019-03-27 DIAGNOSIS — I2510 Atherosclerotic heart disease of native coronary artery without angina pectoris: Secondary | ICD-10-CM | POA: Diagnosis not present

## 2019-03-27 DIAGNOSIS — G4733 Obstructive sleep apnea (adult) (pediatric): Secondary | ICD-10-CM | POA: Diagnosis not present

## 2019-03-28 ENCOUNTER — Emergency Department (HOSPITAL_COMMUNITY): Payer: Medicare Other

## 2019-03-28 ENCOUNTER — Other Ambulatory Visit: Payer: Self-pay

## 2019-03-28 ENCOUNTER — Encounter (HOSPITAL_COMMUNITY): Payer: Self-pay | Admitting: *Deleted

## 2019-03-28 ENCOUNTER — Inpatient Hospital Stay (HOSPITAL_COMMUNITY)
Admission: EM | Admit: 2019-03-28 | Discharge: 2019-04-03 | DRG: 871 | Disposition: A | Discharge status: Hospice | Payer: Medicare Other | Attending: Internal Medicine | Admitting: Internal Medicine

## 2019-03-28 ENCOUNTER — Telehealth: Payer: Self-pay | Admitting: *Deleted

## 2019-03-28 DIAGNOSIS — L8990 Pressure ulcer of unspecified site, unspecified stage: Secondary | ICD-10-CM | POA: Insufficient documentation

## 2019-03-28 DIAGNOSIS — I4891 Unspecified atrial fibrillation: Secondary | ICD-10-CM | POA: Diagnosis not present

## 2019-03-28 DIAGNOSIS — M2550 Pain in unspecified joint: Secondary | ICD-10-CM | POA: Diagnosis not present

## 2019-03-28 DIAGNOSIS — G9341 Metabolic encephalopathy: Secondary | ICD-10-CM | POA: Diagnosis not present

## 2019-03-28 DIAGNOSIS — G928 Other toxic encephalopathy: Secondary | ICD-10-CM | POA: Diagnosis present

## 2019-03-28 DIAGNOSIS — I129 Hypertensive chronic kidney disease with stage 1 through stage 4 chronic kidney disease, or unspecified chronic kidney disease: Secondary | ICD-10-CM | POA: Diagnosis not present

## 2019-03-28 DIAGNOSIS — R579 Shock, unspecified: Secondary | ICD-10-CM | POA: Diagnosis not present

## 2019-03-28 DIAGNOSIS — Z7401 Bed confinement status: Secondary | ICD-10-CM | POA: Diagnosis not present

## 2019-03-28 DIAGNOSIS — A419 Sepsis, unspecified organism: Secondary | ICD-10-CM | POA: Diagnosis not present

## 2019-03-28 DIAGNOSIS — N390 Urinary tract infection, site not specified: Secondary | ICD-10-CM | POA: Diagnosis not present

## 2019-03-28 DIAGNOSIS — Z79899 Other long term (current) drug therapy: Secondary | ICD-10-CM

## 2019-03-28 DIAGNOSIS — N184 Chronic kidney disease, stage 4 (severe): Secondary | ICD-10-CM | POA: Diagnosis not present

## 2019-03-28 DIAGNOSIS — E869 Volume depletion, unspecified: Secondary | ICD-10-CM | POA: Diagnosis not present

## 2019-03-28 DIAGNOSIS — Z66 Do not resuscitate: Secondary | ICD-10-CM | POA: Diagnosis not present

## 2019-03-28 DIAGNOSIS — R578 Other shock: Secondary | ICD-10-CM | POA: Diagnosis present

## 2019-03-28 DIAGNOSIS — E875 Hyperkalemia: Secondary | ICD-10-CM | POA: Diagnosis present

## 2019-03-28 DIAGNOSIS — N281 Cyst of kidney, acquired: Secondary | ICD-10-CM | POA: Diagnosis not present

## 2019-03-28 DIAGNOSIS — E1122 Type 2 diabetes mellitus with diabetic chronic kidney disease: Secondary | ICD-10-CM | POA: Diagnosis not present

## 2019-03-28 DIAGNOSIS — R52 Pain, unspecified: Secondary | ICD-10-CM | POA: Diagnosis not present

## 2019-03-28 DIAGNOSIS — E872 Acidosis, unspecified: Secondary | ICD-10-CM

## 2019-03-28 DIAGNOSIS — E7800 Pure hypercholesterolemia, unspecified: Secondary | ICD-10-CM | POA: Diagnosis not present

## 2019-03-28 DIAGNOSIS — G92 Toxic encephalopathy: Secondary | ICD-10-CM | POA: Diagnosis present

## 2019-03-28 DIAGNOSIS — E1149 Type 2 diabetes mellitus with other diabetic neurological complication: Secondary | ICD-10-CM | POA: Diagnosis not present

## 2019-03-28 DIAGNOSIS — G894 Chronic pain syndrome: Secondary | ICD-10-CM | POA: Diagnosis not present

## 2019-03-28 DIAGNOSIS — Z7189 Other specified counseling: Secondary | ICD-10-CM

## 2019-03-28 DIAGNOSIS — Z888 Allergy status to other drugs, medicaments and biological substances status: Secondary | ICD-10-CM

## 2019-03-28 DIAGNOSIS — Z515 Encounter for palliative care: Secondary | ICD-10-CM | POA: Diagnosis not present

## 2019-03-28 DIAGNOSIS — Z794 Long term (current) use of insulin: Secondary | ICD-10-CM

## 2019-03-28 DIAGNOSIS — K5730 Diverticulosis of large intestine without perforation or abscess without bleeding: Secondary | ICD-10-CM | POA: Diagnosis not present

## 2019-03-28 DIAGNOSIS — R5381 Other malaise: Secondary | ICD-10-CM | POA: Diagnosis not present

## 2019-03-28 DIAGNOSIS — Z4659 Encounter for fitting and adjustment of other gastrointestinal appliance and device: Secondary | ICD-10-CM

## 2019-03-28 DIAGNOSIS — Z1159 Encounter for screening for other viral diseases: Secondary | ICD-10-CM

## 2019-03-28 DIAGNOSIS — Z4682 Encounter for fitting and adjustment of non-vascular catheter: Secondary | ICD-10-CM | POA: Diagnosis not present

## 2019-03-28 DIAGNOSIS — Z7952 Long term (current) use of systemic steroids: Secondary | ICD-10-CM

## 2019-03-28 DIAGNOSIS — N2581 Secondary hyperparathyroidism of renal origin: Secondary | ICD-10-CM | POA: Diagnosis not present

## 2019-03-28 DIAGNOSIS — E1142 Type 2 diabetes mellitus with diabetic polyneuropathy: Secondary | ICD-10-CM | POA: Diagnosis present

## 2019-03-28 DIAGNOSIS — D638 Anemia in other chronic diseases classified elsewhere: Secondary | ICD-10-CM | POA: Diagnosis present

## 2019-03-28 DIAGNOSIS — I4821 Permanent atrial fibrillation: Secondary | ICD-10-CM | POA: Diagnosis not present

## 2019-03-28 DIAGNOSIS — N179 Acute kidney failure, unspecified: Secondary | ICD-10-CM | POA: Diagnosis present

## 2019-03-28 DIAGNOSIS — R6520 Severe sepsis without septic shock: Secondary | ICD-10-CM | POA: Diagnosis not present

## 2019-03-28 DIAGNOSIS — G4733 Obstructive sleep apnea (adult) (pediatric): Secondary | ICD-10-CM | POA: Diagnosis not present

## 2019-03-28 DIAGNOSIS — M170 Bilateral primary osteoarthritis of knee: Secondary | ICD-10-CM | POA: Diagnosis present

## 2019-03-28 DIAGNOSIS — Z6841 Body Mass Index (BMI) 40.0 and over, adult: Secondary | ICD-10-CM | POA: Diagnosis not present

## 2019-03-28 DIAGNOSIS — K8020 Calculus of gallbladder without cholecystitis without obstruction: Secondary | ICD-10-CM | POA: Diagnosis not present

## 2019-03-28 DIAGNOSIS — Z87442 Personal history of urinary calculi: Secondary | ICD-10-CM

## 2019-03-28 DIAGNOSIS — R531 Weakness: Secondary | ICD-10-CM | POA: Diagnosis not present

## 2019-03-28 DIAGNOSIS — N19 Unspecified kidney failure: Secondary | ICD-10-CM

## 2019-03-28 DIAGNOSIS — Z833 Family history of diabetes mellitus: Secondary | ICD-10-CM

## 2019-03-28 DIAGNOSIS — Z7901 Long term (current) use of anticoagulants: Secondary | ICD-10-CM

## 2019-03-28 LAB — SODIUM, URINE, RANDOM: Sodium, Ur: 17 mmol/L

## 2019-03-28 LAB — CBC WITH DIFFERENTIAL/PLATELET
Abs Immature Granulocytes: 0.2 10*3/uL — ABNORMAL HIGH (ref 0.00–0.07)
Basophils Absolute: 0 10*3/uL (ref 0.0–0.1)
Basophils Relative: 0 %
Eosinophils Absolute: 0 10*3/uL (ref 0.0–0.5)
Eosinophils Relative: 0 %
HCT: 31.2 % — ABNORMAL LOW (ref 36.0–46.0)
Hemoglobin: 9.4 g/dL — ABNORMAL LOW (ref 12.0–15.0)
Immature Granulocytes: 1 %
Lymphocytes Relative: 7 %
Lymphs Abs: 1 10*3/uL (ref 0.7–4.0)
MCH: 26.9 pg (ref 26.0–34.0)
MCHC: 30.1 g/dL (ref 30.0–36.0)
MCV: 89.4 fL (ref 80.0–100.0)
Monocytes Absolute: 1 10*3/uL (ref 0.1–1.0)
Monocytes Relative: 7 %
Neutro Abs: 12 10*3/uL — ABNORMAL HIGH (ref 1.7–7.7)
Neutrophils Relative %: 85 %
Platelets: 497 10*3/uL — ABNORMAL HIGH (ref 150–400)
RBC: 3.49 MIL/uL — ABNORMAL LOW (ref 3.87–5.11)
RDW: 19.2 % — ABNORMAL HIGH (ref 11.5–15.5)
WBC: 14.2 10*3/uL — ABNORMAL HIGH (ref 4.0–10.5)
nRBC: 0 % (ref 0.0–0.2)

## 2019-03-28 LAB — LACTIC ACID, PLASMA
Lactic Acid, Venous: 7.4 mmol/L (ref 0.5–1.9)
Lactic Acid, Venous: 7.2 mmol/L (ref 0.5–1.9)

## 2019-03-28 LAB — COMPREHENSIVE METABOLIC PANEL
ALT: 31 U/L (ref 0–44)
AST: 33 U/L (ref 15–41)
Albumin: 3.1 g/dL — ABNORMAL LOW (ref 3.5–5.0)
Alkaline Phosphatase: 75 U/L (ref 38–126)
Anion gap: 29 — ABNORMAL HIGH (ref 5–15)
BUN: 127 mg/dL — ABNORMAL HIGH (ref 8–23)
CO2: 11 mmol/L — ABNORMAL LOW (ref 22–32)
Calcium: 7.5 mg/dL — ABNORMAL LOW (ref 8.9–10.3)
Chloride: 90 mmol/L — ABNORMAL LOW (ref 98–111)
Creatinine, Ser: 6.04 mg/dL — ABNORMAL HIGH (ref 0.44–1.00)
GFR calc Af Amer: 7 mL/min — ABNORMAL LOW (ref >60)
GFR calc non Af Amer: 6 mL/min — ABNORMAL LOW (ref >60)
Glucose, Bld: 146 mg/dL — ABNORMAL HIGH (ref 70–99)
Potassium: >7.5 mmol/L (ref 3.5–5.1)
Sodium: 130 mmol/L — ABNORMAL LOW (ref 135–145)
Total Bilirubin: 0.9 mg/dL (ref 0.3–1.2)
Total Protein: 6.4 g/dL — ABNORMAL LOW (ref 6.5–8.1)

## 2019-03-28 LAB — URINALYSIS, ROUTINE W REFLEX MICROSCOPIC
Bilirubin Urine: NEGATIVE
Glucose, UA: NEGATIVE mg/dL
Ketones, ur: 5 mg/dL — AB
Nitrite: NEGATIVE
Protein, ur: 30 mg/dL — AB
Specific Gravity, Urine: 1.013 (ref 1.005–1.030)
pH: 5 (ref 5.0–8.0)

## 2019-03-28 LAB — PROTIME-INR
INR: 2.4 — ABNORMAL HIGH (ref 0.8–1.2)
Prothrombin Time: 25.7 seconds — ABNORMAL HIGH (ref 11.4–15.2)

## 2019-03-28 LAB — CREATININE, URINE, RANDOM: Creatinine, Urine: 68.64 mg/dL

## 2019-03-28 LAB — CBC
HCT: 26.5 % — ABNORMAL LOW (ref 36.0–46.0)
Hemoglobin: 8.1 g/dL — ABNORMAL LOW (ref 12.0–15.0)
MCH: 26.8 pg (ref 26.0–34.0)
MCHC: 30.6 g/dL (ref 30.0–36.0)
MCV: 87.7 fL (ref 80.0–100.0)
Platelets: 405 10*3/uL — ABNORMAL HIGH (ref 150–400)
RBC: 3.02 MIL/uL — ABNORMAL LOW (ref 3.87–5.11)
RDW: 19.1 % — ABNORMAL HIGH (ref 11.5–15.5)
WBC: 10.1 10*3/uL (ref 4.0–10.5)
nRBC: 0 % (ref 0.0–0.2)

## 2019-03-28 LAB — CBG MONITORING, ED
Glucose-Capillary: 125 mg/dL — ABNORMAL HIGH (ref 70–99)
Glucose-Capillary: 153 mg/dL — ABNORMAL HIGH (ref 70–99)

## 2019-03-28 LAB — SARS CORONAVIRUS 2 BY RT PCR (HOSPITAL ORDER, PERFORMED IN ~~LOC~~ HOSPITAL LAB): SARS Coronavirus 2: NEGATIVE

## 2019-03-28 MED ORDER — CALCIUM GLUCONATE-NACL 1-0.675 GM/50ML-% IV SOLN
1.0000 g | Freq: Once | INTRAVENOUS | Status: AC
  Administered 2019-03-28: 19:00:00 1000 mg via INTRAVENOUS
  Filled 2019-03-28: qty 50

## 2019-03-28 MED ORDER — GLUCOSE BLOOD VI STRP: ORAL_STRIP | 12 refills | Status: AC

## 2019-03-28 MED ORDER — NITROGLYCERIN 0.4 MG SL SUBL
0.4000 mg | SUBLINGUAL_TABLET | SUBLINGUAL | Status: DC | PRN
  Filled 2019-03-28: qty 1

## 2019-03-28 MED ORDER — HEPARIN SODIUM (PORCINE) 5000 UNIT/ML IJ SOLN: 5000.0000 [IU] | Freq: Three times a day (TID) | INTRAMUSCULAR | Status: DC

## 2019-03-28 MED ORDER — PREDNISONE 5 MG PO TABS
15.0000 mg | ORAL_TABLET | Freq: Every day | ORAL | Status: DC
  Administered 2019-03-29 – 2019-04-03 (×6): 15 mg via ORAL
  Filled 2019-03-28 (×6): qty 1

## 2019-03-28 MED ORDER — LACTATED RINGERS IV BOLUS
1000.0000 mL | Freq: Once | INTRAVENOUS | Status: AC
  Administered 2019-03-28: 1000 mL via INTRAVENOUS

## 2019-03-28 MED ORDER — FLUTICASONE PROPIONATE 50 MCG/ACT NA SUSP: 2.0000 | Freq: Every day | NASAL | Status: DC | PRN

## 2019-03-28 MED ORDER — ONETOUCH ULTRASOFT LANCETS MISC: 12 refills | Status: DC

## 2019-03-28 MED ORDER — BISACODYL 5 MG PO TBEC: 5.0000 mg | DELAYED_RELEASE_TABLET | Freq: Every day | ORAL | Status: DC | PRN

## 2019-03-28 MED ORDER — SODIUM BICARBONATE 8.4 % IV SOLN
INTRAVENOUS | Status: DC
  Administered 2019-03-28 – 2019-03-30 (×5): via INTRAVENOUS
  Filled 2019-03-28 (×10): qty 150

## 2019-03-28 MED ORDER — GABAPENTIN 300 MG PO CAPS
300.0000 mg | ORAL_CAPSULE | Freq: Three times a day (TID) | ORAL | Status: DC
  Administered 2019-03-29 (×3): 300 mg via ORAL
  Filled 2019-03-28 (×3): qty 1

## 2019-03-28 MED ORDER — VITAMIN B-12 1000 MCG PO TABS
1000.0000 ug | ORAL_TABLET | Freq: Every day | ORAL | Status: DC
  Administered 2019-03-29 – 2019-04-03 (×6): 1000 ug via ORAL
  Filled 2019-03-28 (×6): qty 1

## 2019-03-28 MED ORDER — ACETAMINOPHEN 650 MG RE SUPP: 650.0000 mg | Freq: Four times a day (QID) | RECTAL | Status: DC | PRN

## 2019-03-28 MED ORDER — CALCIUM GLUCONATE 10 % IV SOLN: 1.0000 g | Freq: Once | INTRAVENOUS | Status: DC

## 2019-03-28 MED ORDER — ACETAMINOPHEN 325 MG PO TABS
650.0000 mg | ORAL_TABLET | Freq: Four times a day (QID) | ORAL | Status: DC | PRN
  Administered 2019-03-29: 650 mg via ORAL
  Filled 2019-03-28: qty 2

## 2019-03-28 MED ORDER — ROSUVASTATIN CALCIUM 20 MG PO TABS: 10.0000 mg | ORAL_TABLET | Freq: Every day | ORAL | 99 refills | Status: AC

## 2019-03-28 MED ORDER — SODIUM POLYSTYRENE SULFONATE 15 GM/60ML PO SUSP
60.0000 g | Freq: Once | ORAL | Status: AC
  Administered 2019-03-29: 60 g via ORAL

## 2019-03-28 MED ORDER — DEXTROSE 50 % IV SOLN
1.0000 | Freq: Once | INTRAVENOUS | Status: AC
  Administered 2019-03-28: 50 mL via INTRAVENOUS
  Filled 2019-03-28: qty 50

## 2019-03-28 MED ORDER — FERROUS SULFATE 325 (65 FE) MG PO TABS
325.0000 mg | ORAL_TABLET | Freq: Every day | ORAL | Status: DC
  Administered 2019-03-29 – 2019-04-03 (×6): 325 mg via ORAL
  Filled 2019-03-28 (×6): qty 1

## 2019-03-28 MED ORDER — INSULIN ASPART 100 UNIT/ML IV SOLN
5.0000 [IU] | Freq: Once | INTRAVENOUS | Status: AC
  Administered 2019-03-28: 5 [IU] via INTRAVENOUS
  Filled 2019-03-28: qty 0.05

## 2019-03-28 MED ORDER — INSULIN GLARGINE 100 UNIT/ML ~~LOC~~ SOLN
10.0000 [IU] | Freq: Every day | SUBCUTANEOUS | Status: DC
  Administered 2019-03-29 – 2019-04-03 (×6): 10 [IU] via SUBCUTANEOUS
  Filled 2019-03-28 (×6): qty 0.1

## 2019-03-28 MED ORDER — APIXABAN 5 MG PO TABS
5.0000 mg | ORAL_TABLET | Freq: Two times a day (BID) | ORAL | Status: DC
  Administered 2019-03-29 (×2): 5 mg via ORAL
  Filled 2019-03-28 (×2): qty 1

## 2019-03-28 MED ORDER — SENNOSIDES-DOCUSATE SODIUM 8.6-50 MG PO TABS: 1.0000 | ORAL_TABLET | Freq: Every evening | ORAL | Status: DC | PRN

## 2019-03-28 MED ORDER — SODIUM BICARBONATE 8.4 % IV SOLN
50.0000 meq | Freq: Once | INTRAVENOUS | Status: AC
  Administered 2019-03-28: 50 meq via INTRAVENOUS
  Filled 2019-03-28: qty 50

## 2019-03-28 MED ORDER — CEPHALEXIN 500 MG PO CAPS
500.0000 mg | ORAL_CAPSULE | Freq: Every day | ORAL | Status: DC
  Administered 2019-03-29 – 2019-04-03 (×6): 500 mg via ORAL
  Filled 2019-03-28 (×6): qty 1

## 2019-03-28 MED ORDER — ALBUTEROL SULFATE (2.5 MG/3ML) 0.083% IN NEBU: 2.5000 mg | INHALATION_SOLUTION | Freq: Four times a day (QID) | RESPIRATORY_TRACT | Status: DC | PRN

## 2019-03-28 MED ORDER — METOPROLOL TARTRATE 25 MG PO TABS
100.0000 mg | ORAL_TABLET | Freq: Two times a day (BID) | ORAL | Status: DC
  Administered 2019-03-29: 100 mg via ORAL
  Filled 2019-03-28 (×2): qty 4

## 2019-03-28 MED ORDER — MAGNESIUM CITRATE PO SOLN: 1.0000 | Freq: Once | ORAL | Status: DC | PRN

## 2019-03-28 MED ORDER — FERROUS SULFATE 325 (65 FE) MG PO TABS: 325.0000 mg | ORAL_TABLET | Freq: Every day | ORAL | 1 refills | Status: AC

## 2019-03-28 MED ORDER — SODIUM BICARBONATE 8.4 % IV SOLN
Freq: Once | INTRAVENOUS | Status: DC
  Filled 2019-03-28: qty 100

## 2019-03-28 MED ORDER — SODIUM POLYSTYRENE SULFONATE 15 GM/60ML PO SUSP
45.0000 g | Freq: Once | ORAL | Status: DC
  Filled 2019-03-28: qty 180

## 2019-03-28 MED ORDER — ONETOUCH VERIO W/DEVICE KIT: 1.0000 | PACK | Freq: Three times a day (TID) | 1 refills | Status: AC

## 2019-03-28 MED ORDER — ONDANSETRON HCL 4 MG/2ML IJ SOLN
4.0000 mg | Freq: Four times a day (QID) | INTRAMUSCULAR | Status: DC | PRN
  Administered 2019-04-03: 4 mg via INTRAVENOUS
  Filled 2019-03-28: qty 2

## 2019-03-28 MED ORDER — DEXTROSE 50 % IV SOLN
INTRAVENOUS | Status: AC
  Filled 2019-03-28: qty 50

## 2019-03-28 MED ORDER — ONDANSETRON HCL 4 MG PO TABS: 4.0000 mg | ORAL_TABLET | Freq: Four times a day (QID) | ORAL | Status: DC | PRN

## 2019-03-28 MED ORDER — OXYCODONE HCL 5 MG PO TABS
5.0000 mg | ORAL_TABLET | ORAL | Status: DC | PRN
  Administered 2019-03-29 – 2019-04-02 (×2): 5 mg via ORAL
  Filled 2019-03-28 (×2): qty 1

## 2019-03-28 MED ORDER — IPRATROPIUM BROMIDE 0.02 % IN SOLN: 0.5000 mg | Freq: Four times a day (QID) | RESPIRATORY_TRACT | Status: DC | PRN

## 2019-03-28 NOTE — ED Triage Notes (Signed)
Per EMS, pt was transported from home, she has been c/o generalized weakness x 2 months, was at a rehab facility and was discharged.  She is living with her son.  Her son endorses pt having hallucination today, saying her diseased husband was there with her.  However, pt reported to EMS that her husband is diseased.  She is c/o generalized body pain.

## 2019-03-28 NOTE — ED Notes (Addendum)
Patient had a sugar of 153

## 2019-03-28 NOTE — ED Notes (Addendum)
Date and time results received: 03/28/19 5:36 PM  Test: Potassium  Critical Value: greater than 7.5  BUN is going to be greater than 100.    Regenia Skeeter, MD made aware.

## 2019-03-28 NOTE — ED Notes (Signed)
Pt reports generalized weakness, L leg pain, lower abd pain.  Pt is unable to report how long she has been weak or having pain.  However, she reports her L leg pain started today.  She lives with her son, whom "supposedly takes care" of her.  She is alert and oriented to person, place, and situation.  She stated this month was March and the day was Saturday.

## 2019-03-28 NOTE — H&P (Addendum)
History and Physical   [facility] [place] @ [place] Admission History and Physical [employer], D.O.    Patient Name: Abigail Duncan MR#: [id number] Date of Birth: [birth year] Date of Admission: 03/28/2019  Referring MD/NP/PA: Dr. Regenia Skeeter Primary Care Physician: McDiarmid, Blane Ohara, MD  Chief Complaint:  Chief Complaint  Patient presents with  . Weakness    HPI: Abigail Duncan is a 78 y.o. female with a known history of diabetes, peripheral neuropathy obstructive sleep apnea, spinal stenosis, osteoarthritis, chronic pain syndrome, atrial fibrillation on Coumadin presents to the emergency department for evaluation of weakness and altered mental status.  Patient was hospitalized for urinary tract infection and subsequently went to rehab.  She has been home for the past month however per report she has been having increasing difficulty with weakness, difficulty with ambulation and activities of daily living.  Per the son the patient has been having worsening confusion over the last week and hallucinations over the last few days..  On my questioning patient complains only of weakness. Patient denies fevers/chills, weakness, dizziness, chest pain, shortness of breath, N/V/C/D, abdominal pain, dysuria/frequency, changes in mental status.   I discussed with her at length her wishes regarding her medical treatment and advanced directives.  She stated that she would consider dialysis if she really needed it but would like to speak with the nephrologist prior to making decision.  She also stated that she would want to have CPR as well as intubation if needed.  EMS/ED Course: Patient received bicarb, calcium, insulin, dextrose, lactated Ringer's, Kayexalate. Medical admission has been requested for further management of acute kidney injury.  Review of Systems: Patient complains only of weakness CONSTITUTIONAL: No fever/chills, fatigue, weight gain/loss, headache. EYES: No blurry  or double vision. ENT: No tinnitus, postnasal drip, redness or soreness of the oropharynx. RESPIRATORY: No cough, dyspnea, wheeze.  No hemoptysis.  CARDIOVASCULAR: No chest pain, palpitations, syncope, orthopnea. No lower extremity edema.  GASTROINTESTINAL: No nausea, vomiting, abdominal pain, diarrhea, constipation.  No hematemesis, melena or hematochezia. GENITOURINARY: No dysuria, frequency, hematuria. ENDOCRINE: No polyuria or nocturia. No heat or cold intolerance. HEMATOLOGY: No anemia, bruising, bleeding. INTEGUMENTARY: No rashes, ulcers, lesions. MUSCULOSKELETAL: No arthritis, gout, dyspnea. NEUROLOGIC: No numbness, tingling, ataxia, seizure-type activity, weakness. PSYCHIATRIC: No anxiety, depression, insomnia.   Past Medical History:  Diagnosis Date  . Anticoagulation goal of INR 2 to 3 07/15/2018   Atrial fibrillation, permanent  . Bilateral primary osteoarthritis of knee 03/23/2019  . Chronic pain syndrome 07/15/2018  . Degenerative arthritis of knee 04/2001   by xray  . Diabetes mellitus with proteinuria ([place]) 03/23/2019  . Diabetic peripheral neuropathy ([place]) 10/23/2013  . Diabetic peripheral neuropathy ([place]) 10/23/2013  . Elevated sed rate   . Impaired mobility 05/21/2017  . Kidney stone on right side 05/12/2016   [place] 05/2016 - non-obstructing right 6 mm stone  . Leg swelling 06/14/2015  . Lower urinary tract infectious disease   . Obstructive sleep apnea 05/19/2018   Sleep study showed some apnea and nighttime desaturations. However, did not meet criteria for CPAP.  Marland Kitchen [place] 09/09/2018  . Right knee pain 07/23/2014  . Sciatica of left side 12/2003   DDD L5,S1   . Secondary hyperparathyroidism ([place]) 05/31/2017  . Shortness of breath 10/16/2016  . Spinal stenosis, lumbar 04/2004   mild on MRI at L 4-5    Past Surgical History:  Procedure Laterality Date  . benign breast tumor     Excised 40+ years ago  .  COLPOSCOPY  03/1991   dysplasia  . PTCA (aka ANGIOPLASTY)   07/1993   RCA     reports that she has never smoked. She has never used smokeless tobacco. She reports that she does not drink alcohol or use drugs.  Allergies  Allergen Reactions  . Lisinopril     REACTION: bad dreams    Family History  Problem Relation Age of Onset  . Cancer Mother        breast, dementia  . Dementia Mother   . Stroke Mother   . Cancer Father 73       colon  . Heart disease Brother   . Cancer Sister        lung  . Heart attack Sister   . Diabetes Brother     Prior to Admission medications   Medication Sig Start Date End Date Taking? Authorizing Provider  Blood Glucose Monitoring Suppl (ONETOUCH VERIO) w/Device KIT 1 Device by Does not apply route 3 (three) times daily. 03/28/19  Yes McDiarmid, Blane Ohara, MD  apixaban (ELIQUIS) 5 MG TABS tablet Take 1 tablet (5 mg total) by mouth 2 (two) times daily. 02/21/19   Mullis, Kiersten P, DO  enalapril (VASOTEC) 10 MG tablet TAKE 1 TABLET BY MOUTH EVERY DAY Patient taking differently: Take 10 mg by mouth daily.  04/22/18   Jettie Booze, MD  ferrous sulfate 325 (65 FE) MG tablet Take 1 tablet (325 mg total) by mouth daily. 03/28/19   McDiarmid, Blane Ohara, MD  fluticasone (FLONASE) 50 MCG/ACT nasal spray SPRAY 2 SPRAYS INTO EACH NOSTRIL EVERY DAY Patient taking differently: Place 2 sprays into both nostrils daily as needed for allergies.  07/25/18   McDiarmid, Blane Ohara, MD  furosemide (LASIX) 40 MG tablet TAKE 1 TABLET BY MOUTH TWICE A DAY Patient taking differently: Take 40 mg by mouth daily.  04/29/18   Jettie Booze, MD  gabapentin (NEURONTIN) 300 MG capsule Take 1 capsule (300 mg total) by mouth 3 (three) times daily. 02/21/19   Mullis, Kiersten P, DO  glucose blood test strip Use as instructed 03/28/19   McDiarmid, Blane Ohara, MD  glucose blood test strip Use as instructed 03/28/19   McDiarmid, Blane Ohara, MD  HYDROcodone-acetaminophen (NORCO/VICODIN) 5-325 MG tablet Take 1 tablet by mouth every 8 (eight) hours as needed  for moderate pain. 12/27/18   McDiarmid, Blane Ohara, MD  insulin glargine (LANTUS) 100 UNIT/ML injection Inject 0.1 mLs (10 Units total) into the skin daily. 02/22/19   Mullis, Kiersten P, DO  KLOR-CON M20 20 MEQ tablet TAKE 1 TABLET BY MOUTH EVERY DAY Patient taking differently: Take 20 mEq by mouth daily.  01/02/19   Jettie Booze, MD  Lancets [facility] ULTRASOFT) lancets Use as instructed 03/28/19   McDiarmid, Blane Ohara, MD  metFORMIN (GLUCOPHAGE) 500 MG tablet TAKE 2 TABLETS BY MOUTH EVERY MORNING AND 1 TABLET EVERY EVENING Patient taking differently: Take 500-1,000 mg by mouth 2 (two) times daily with a meal. TAKE 2 TABLETS BY MOUTH EVERY MORNING AND 1 TABLET EVERY EVENING 12/05/18   McDiarmid, Blane Ohara, MD  metoprolol tartrate (LOPRESSOR) 100 MG tablet TAKE 1 TABLET (100 MG TOTAL) 2 (TWO) TIMES DAILY BY MOUTH. 08/03/18   McDiarmid, Blane Ohara, MD  nitroGLYCERIN (NITROSTAT) 0.4 MG SL tablet PLACE 1 TABLET (0.4 MG TOTAL) UNDER THE TONGUE EVERY 5 (FIVE) MINUTES AS NEEDED FOR CHEST PAIN. 07/28/17   McDiarmid, Blane Ohara, MD  predniSONE (DELTASONE) 5 MG tablet Take 3 tablets (15 mg  total) by mouth daily with breakfast for 30 days. 03/23/19 04/22/19  McDiarmid, Blane Ohara, MD  rosuvastatin (CRESTOR) 20 MG tablet Take 0.5 tablets (10 mg total) by mouth daily. 03/28/19   McDiarmid, Blane Ohara, MD  vitamin B-12 (CYANOCOBALAMIN) 1000 MCG tablet Take 1,000 mcg by mouth daily.    [provider]  Vitamin D, Ergocalciferol, (DRISDOL) 50000 units CAPS capsule Take 50,000 Units by mouth once a week. 01/12/18   [provider]    Physical Exam: Vitals:   03/28/19 1820 03/28/19 1823 03/28/19 1830 03/28/19 1921  BP: (!) 151/117 (!) 151/117 (!) 165/150 107/67  Pulse:  72 99 76  Resp: '13 13 16 15  ' Temp:      TempSrc:      SpO2:  98% (!) 72% 100%    GENERAL: 78 y.o.-year-old obese female patient chronically ill-appearing lying in the bed in no acute distress.  Pleasant and cooperative.  Appears fatigued. HEENT:  Head atraumatic, normocephalic. Pupils equal. Mucus membranes moist. NECK: Supple. No JVD. CHEST: Normal breath sounds bilaterally. No wheezing, rales, rhonchi or crackles. No use of accessory muscles of respiration.  No reproducible chest wall tenderness.  CARDIOVASCULAR: Irregularly irregular S1, S2 normal.  ABDOMEN: Soft, nondistended, mild bilateral lower quadrant tenderness. No rebound, guarding, rigidity. Normoactive bowel sounds present in all four quadrants.  EXTREMITIES: Multiple areas of bruises bilateral upper extremities.  Mild nonpitting pedal edema, cyanosis, or clubbing. No calf tenderness or Homan's sign.  NEUROLOGIC: The patient is alert and oriented x 3. . SKIN: Warm, dry, and intact without obvious rash, lesion, or ulcer.    Labs on Admission:  CBC: Recent Labs  Lab 03/28/19 1611  WBC 14.2*  NEUTROABS 12.0*  HGB 9.4*  HCT 31.2*  MCV 89.4  PLT 144*   Basic Metabolic Panel: Recent Labs  Lab 03/28/19 1611  NA 130*  K >7.5*  CL 90*  CO2 11*  GLUCOSE 146*  BUN 127*  CREATININE 6.04*  CALCIUM 7.5*   GFR: Estimated Creatinine Clearance: 9.4 mL/min (A) (by C-G formula based on SCr of 6.04 mg/dL (H)). Liver Function Tests: Recent Labs  Lab 03/28/19 1611  AST 33  ALT 31  ALKPHOS 75  BILITOT 0.9  PROT 6.4*  ALBUMIN 3.1*   No results for input(s): LIPASE, AMYLASE in the last 168 hours. No results for input(s): AMMONIA in the last 168 hours. Coagulation Profile: No results for input(s): INR, PROTIME in the last 168 hours. Cardiac Enzymes: No results for input(s): CKTOTAL, CKMB, CKMBINDEX, TROPONINI in the last 168 hours. BNP (last 3 results) No results for input(s): PROBNP in the last 8760 hours. HbA1C: No results for input(s): HGBA1C in the last 72 hours. CBG: Recent Labs  Lab 03/28/19 1601  GLUCAP 125*   Lipid Profile: No results for input(s): CHOL, HDL, LDLCALC, TRIG, CHOLHDL, LDLDIRECT in the last 72 hours. Thyroid Function Tests: No  results for input(s): TSH, T4TOTAL, FREET4, T3FREE, THYROIDAB in the last 72 hours. Anemia Panel: No results for input(s): VITAMINB12, FOLATE, FERRITIN, TIBC, IRON, RETICCTPCT in the last 72 hours. Urine analysis:    Component Value Date/Time   COLORURINE YELLOW 03/28/2019 1553   APPEARANCEUR HAZY (A) 03/28/2019 1553   LABSPEC 1.013 03/28/2019 1553   PHURINE 5.0 03/28/2019 1553   GLUCOSEU NEGATIVE 03/28/2019 1553   HGBUR MODERATE (A) 03/28/2019 1553   BILIRUBINUR NEGATIVE 03/28/2019 1553   BILIRUBINUR small 06/16/2011 1713   KETONESUR 5 (A) 03/28/2019 1553   PROTEINUR 30 (A) 03/28/2019 1553  UROBILINOGEN 1.0 06/16/2011 1713   NITRITE NEGATIVE 03/28/2019 1553   LEUKOCYTESUR TRACE (A) 03/28/2019 1553   Sepsis Labs: '@LABRCNTIP' (procalcitonin:4,lacticidven:4) ) Recent Results (from the past 240 hour(s))  SARS Coronavirus 2 (CEPHEID - Performed in [place] hospital lab), Hosp Order     Status: None   Collection Time: 03/28/19  4:11 PM   Specimen: Nasopharyngeal Swab  Result Value Ref Range Status   SARS Coronavirus 2 NEGATIVE NEGATIVE Final    Comment: (NOTE) If result is NEGATIVE SARS-CoV-2 target nucleic acids are NOT DETECTED. The SARS-CoV-2 RNA is generally detectable in upper and lower  respiratory specimens during the acute phase of infection. The lowest  concentration of SARS-CoV-2 viral copies this assay can detect is 250  copies / mL. A negative result does not preclude SARS-CoV-2 infection  and should not be used as the sole basis for treatment or other  patient management decisions.  A negative result may occur with  improper specimen collection / handling, submission of specimen other  than nasopharyngeal swab, presence of viral mutation(s) within the  areas targeted by this assay, and inadequate number of viral copies  (<250 copies / mL). A negative result must be combined with clinical  observations, patient history, and epidemiological information. If result is  POSITIVE SARS-CoV-2 target nucleic acids are DETECTED. The SARS-CoV-2 RNA is generally detectable in upper and lower  respiratory specimens dur ing the acute phase of infection.  Positive  results are indicative of active infection with SARS-CoV-2.  Clinical  correlation with patient history and other diagnostic information is  necessary to determine patient infection status.  Positive results do  not rule out bacterial infection or co-infection with other viruses. If result is PRESUMPTIVE POSTIVE SARS-CoV-2 nucleic acids MAY BE PRESENT.   A presumptive positive result was obtained on the submitted specimen  and confirmed on repeat testing.  While 2019 novel coronavirus  (SARS-CoV-2) nucleic acids may be present in the submitted sample  additional confirmatory testing may be necessary for epidemiological  and / or clinical management purposes  to differentiate between  SARS-CoV-2 and other Sarbecovirus currently known to infect humans.  If clinically indicated additional testing with an alternate test  methodology [id number]) is advised. The SARS-CoV-2 RNA is generally  detectable in upper and lower respiratory sp ecimens during the acute  phase of infection. The expected result is Negative. Fact Sheet for Patients:  StrictlyIdeas.no Fact Sheet for Healthcare Providers: BankingDealers.co.za This test is not yet approved or cleared by the [place] FDA and has been authorized for detection and/or diagnosis of SARS-CoV-2 by FDA under an Emergency Use Authorization (EUA).  This EUA will remain in effect (meaning this test can be used) for the duration of the COVID-19 declaration under Section 564(b)(1) of the Act, 21 U.S.C. section 360bbb-3(b)(1), unless the authorization is terminated or revoked sooner. Performed at [facility], [place] [address], [place], [place] [address]      Radiological Exams on Admission: Ct  Abdomen Pelvis Wo Contrast  Result Date: 03/28/2019 CLINICAL DATA:  Generalized weakness, generalized body pain, diverticulitis suspected EXAM: CT ABDOMEN AND PELVIS WITHOUT CONTRAST TECHNIQUE: Multidetector CT imaging of the abdomen and pelvis was performed following the standard protocol without IV contrast. COMPARISON:  None. FINDINGS: Lower chest: Cardiomegaly. Extensive 3 vessel coronary artery calcifications. Hepatobiliary: No solid liver abnormality is seen. Gallstones. No gallbladder wall thickening, or biliary dilatation. Pancreas: Unremarkable. No pancreatic ductal dilatation or surrounding inflammatory changes. Spleen: Normal in size without significant abnormality.  Adrenals/Urinary Tract: Adrenal glands are unremarkable. Very atrophic right kidney. Unremarkable left kidney. No hydronephrosis. Bladder is unremarkable. Stomach/Bowel: Stomach is within normal limits. Appendix appears normal. No evidence of bowel wall thickening, distention, or inflammatory changes. Sigmoid diverticulosis. Vascular/Lymphatic: No significant vascular findings are present. No enlarged abdominal or pelvic lymph nodes. Reproductive: No mass or other significant abnormality. Other: No abdominal wall hernia or abnormality. No abdominopelvic ascites. Musculoskeletal: No acute or significant osseous findings. IMPRESSION: 1. No acute noncontrast CT findings of the abdomen or pelvis to explain abdominal pain. 2.  Sigmoid diverticulosis.  No evidence of acute diverticulitis. 3.  Gallstones.  No evidence of acute cholelithiasis. 4.  Other chronic and incidental findings as detailed above. Electronically Signed   By: Eddie Candle M.D.   On: 03/28/2019 19:08   Ct Head Wo Contrast  Result Date: 03/28/2019 CLINICAL DATA:  Generalized weakness x2 months. EXAM: CT HEAD WITHOUT CONTRAST TECHNIQUE: Contiguous axial images were obtained from the base of the skull through the vertex without intravenous contrast. COMPARISON:  None. FINDINGS:  Brain: No evidence of acute infarction, hemorrhage, hydrocephalus, extra-axial collection or mass lesion/mass effect. Age related volume loss and chronic microvascular ischemic changes are noted. Vascular: No hyperdense vessel or unexpected calcification. Skull: Normal. Negative for fracture or focal lesion. Sinuses/Orbits: No acute finding. Other: None. IMPRESSION: 1. No acute intracranial abnormality. 2. Age related volume loss and chronic microvascular ischemic changes are noted. Electronically Signed   By: Constance Holster M.D.   On: 03/28/2019 18:54   Dg Chest Portable 1 View  Result Date: 03/28/2019 CLINICAL DATA:  Generalized weakness. EXAM: PORTABLE CHEST 1 VIEW COMPARISON:  Radiograph of Feb 15, 2019. FINDINGS: Stable cardiomegaly. Atherosclerosis of thoracic aorta is noted. No pneumothorax is noted. Right lung is clear. Probable small left pleural effusion is noted with associated atelectasis. Bony thorax is unremarkable. IMPRESSION: Stable cardiomegaly. Probable small left pleural effusion is noted with associated atelectasis. Aortic Atherosclerosis (ICD10-I70.0). Electronically Signed   By: Marijo Conception M.D.   On: 03/28/2019 16:35    EKG: A. fib at 100  Assessment/Plan  This is a 78 y.o. female with a history of diabetes, peripheral neuropathy obstructive sleep apnea, spinal stenosis, osteoarthritis, chronic pain syndrome, atrial fibrillation on Eliquis now being admitted with:  #. Acute kidney injury with hyperkalemia - Admit stepdown - IV fluids and trend BMP.  - Avoid nephrotoxic medications: held enalapril and lasix for now -Hold potassium - Bladder scan and place foley catheter if evidence of urinary retention - Check renal US - D/W Dr. Jimmy Footman  #. Normocytic anemia - Check FOBT, ferritin, B12, folate, Iron/TIBC -Continue iron  #.  History of chronic UTI -Continue Keflex  #. History of atrial fibrillation - Continue metoprolol, Eliquis  #. History of  diabetes - Continue Lantus, Accu-Cheks with regular insulin sliding scale coverage  #. History of neuropathy - Continue gabapentin   Please note at time of my exam patient was alert and oriented x3.  She expressed understanding of her condition and stated that she would like to be full code, would want CPR and would want to be intubated if she had a life-threatening event.  She also stated that she would consider hemodialysis but she has questions and she would like to discuss with her nephrologist first.  Admission status: Stepdown IV Fluids: Per nephrology Diet/Nutrition: Renal, carb controlled Consults called: Nephrology DVT Px: Eliquis, SCDs and early ambulation. Code Status: Full Code  Disposition Plan: To be determined  All the  records are reviewed and case discussed with ED provider. Management plans discussed with the patient and/or family who express understanding and agree with plan of care.  Artrell Lawless D.O. on 03/28/2019 at 8:05 PM CC: Primary care physician; McDiarmid, Blane Ohara, MD   03/28/2019, 8:05 PM

## 2019-03-28 NOTE — ED Provider Notes (Signed)
[facility] DEPT Provider Note   CSN: [id number] Arrival date & time: 03/28/19  1456   LEVEL 5 CAVEAT - ALTERED MENTAL STATUS   History   Chief Complaint Chief Complaint  Patient presents with  . Weakness    HPI Abigail Duncan is a 77 y.o. female.     HPI  78 year old female brought in by EMS for confusion/hallucinations.  History is mostly taken from the son as the patient is confused.  Over the phone, I discussed with [name], who has been taking care of her for the last month since she got out of rehab.  He states she has been unable to walk since being out of rehab due to weakness in both legs.  The patient is complaining of pain in her bilateral legs and this appears to be chronic.  The patient has been confused over the last 1 week or so and over the last 3 to 4 days has been having hallucinations.  Today she was seeing people that were not there, including her deceased husband.  No fevers.  Has had diarrhea recently though no abdominal pain.  No blood in her stools.  Past Medical History:  Diagnosis Date  . Anticoagulation goal of INR 2 to 3 07/15/2018   Atrial fibrillation, permanent  . Bilateral primary osteoarthritis of knee 03/23/2019  . Chronic pain syndrome 07/15/2018  . Degenerative arthritis of knee 04/2001   by xray  . Diabetes mellitus with proteinuria ([place]) 03/23/2019  . Diabetic peripheral neuropathy ([place]) 10/23/2013  . Diabetic peripheral neuropathy ([place]) 10/23/2013  . Elevated sed rate   . Impaired mobility 05/21/2017  . Kidney stone on right side 05/12/2016   [place] 05/2016 - non-obstructing right 6 mm stone  . Leg swelling 06/14/2015  . Lower urinary tract infectious disease   . Obstructive sleep apnea 05/19/2018   Sleep study showed some apnea and nighttime desaturations. However, did not meet criteria for CPAP.  Marland Kitchen [place] 09/09/2018  . Right knee pain 07/23/2014  . Sciatica of left side 12/2003   DDD L5,S1   . Secondary  hyperparathyroidism ([place]) 05/31/2017  . Shortness of breath 10/16/2016  . Spinal stenosis, lumbar 04/2004   mild on MRI at L 4-5    Patient Active Problem List   Diagnosis Date Noted  . AKI (acute kidney injury) ([place]) 03/28/2019  . Generalized pain 03/24/2019  . Hypoalbuminemia 03/24/2019  . Diabetes mellitus with proteinuria ([place]) 03/23/2019  . Bilateral primary osteoarthritis of knee   . Polymyalgia rheumatica ([place])   . Arthralgia of multiple sites   . Generalized weakness 02/15/2019  . Iron deficiency anemia 10/28/2018  . Vitamin B12 deficiency 10/28/2018  . Thrombocytosis ([place]) 10/28/2018  . Chronic pain syndrome 07/15/2018  . Stage 3b chronic kidney disease ([place]) 07/15/2018  . Gait difficulty 07/15/2018  . Atrophic kidney, Right 05/20/2018  . Obstructive sleep apnea 05/19/2018    Class: Chronic  . Normocytic anemia 05/19/2018  . Diabetic retinopathy ([place]) 03/11/2018  . Chronic painful diabetic neuropathy ([place]) 09/10/2017  . Secondary hyperparathyroidism ([place]) 05/31/2017  . Anemia of chronic disease 05/31/2017  . Impaired mobility 05/21/2017  . Vitamin D deficiency 01/25/2017  . Restless leg syndrome 09/19/2015  . Pulmonary hypertension ([place]) 06/14/2015  . Osteopenia 05/09/2015  . Hypertension associated with chronic kidney disease due to type 2 diabetes mellitus ([place]) 04/01/2015  . Encounter for chronic pain management 12/18/2014  . Preventative health care 01/26/2014  . Long term current use of anticoagulant therapy  11/15/2010  . Atrial fibrillation, permanent 01/22/2009  . ALLERGIC RHINITIS, SEASONAL 01/08/2009  . Type II diabetes mellitus with neurological manifestations ([place]) 12/02/2006  . Pure hypercholesterolemia 12/02/2006  . Morbid obesity ([place]) with obstructive sleep apnea 12/02/2006  . Coronary atherosclerosis 12/02/2006  . Osteoarthritis of multiple joints 12/02/2006  . DDD (degenerative disc disease), lumbar 12/02/2006  . INCONTINENCE, URGE 12/02/2006     Past Surgical History:  Procedure Laterality Date  . benign breast tumor     Excised 40+ years ago  . COLPOSCOPY  03/1991   dysplasia  . PTCA (aka ANGIOPLASTY)  07/1993   RCA     OB History   No obstetric history on file.      Home Medications    Prior to Admission medications   Medication Sig Start Date End Date Taking? Authorizing Provider  Blood Glucose Monitoring Suppl (ONETOUCH VERIO) w/Device KIT 1 Device by Does not apply route 3 (three) times daily. 03/28/19  Yes McDiarmid, Blane Ohara, MD  apixaban (ELIQUIS) 5 MG TABS tablet Take 1 tablet (5 mg total) by mouth 2 (two) times daily. 02/21/19   Mullis, Kiersten P, DO  enalapril (VASOTEC) 10 MG tablet TAKE 1 TABLET BY MOUTH EVERY DAY Patient taking differently: Take 10 mg by mouth daily.  04/22/18   Jettie Booze, MD  ferrous sulfate 325 (65 FE) MG tablet Take 1 tablet (325 mg total) by mouth daily. 03/28/19   McDiarmid, Blane Ohara, MD  fluticasone (FLONASE) 50 MCG/ACT nasal spray SPRAY 2 SPRAYS INTO EACH NOSTRIL EVERY DAY Patient taking differently: Place 2 sprays into both nostrils daily as needed for allergies.  07/25/18   McDiarmid, Blane Ohara, MD  furosemide (LASIX) 40 MG tablet TAKE 1 TABLET BY MOUTH TWICE A DAY Patient taking differently: Take 40 mg by mouth daily.  04/29/18   Jettie Booze, MD  gabapentin (NEURONTIN) 300 MG capsule Take 1 capsule (300 mg total) by mouth 3 (three) times daily. 02/21/19   Mullis, Kiersten P, DO  glucose blood test strip Use as instructed 03/28/19   McDiarmid, Blane Ohara, MD  glucose blood test strip Use as instructed 03/28/19   McDiarmid, Blane Ohara, MD  HYDROcodone-acetaminophen (NORCO/VICODIN) 5-325 MG tablet Take 1 tablet by mouth every 8 (eight) hours as needed for moderate pain. 12/27/18   McDiarmid, Blane Ohara, MD  insulin glargine (LANTUS) 100 UNIT/ML injection Inject 0.1 mLs (10 Units total) into the skin daily. 02/22/19   Mullis, Kiersten P, DO  KLOR-CON M20 20 MEQ tablet TAKE 1 TABLET BY MOUTH EVERY  DAY Patient taking differently: Take 20 mEq by mouth daily.  01/02/19   Jettie Booze, MD  Lancets [facility] ULTRASOFT) lancets Use as instructed 03/28/19   McDiarmid, Blane Ohara, MD  metFORMIN (GLUCOPHAGE) 500 MG tablet TAKE 2 TABLETS BY MOUTH EVERY MORNING AND 1 TABLET EVERY EVENING Patient taking differently: Take 500-1,000 mg by mouth 2 (two) times daily with a meal. TAKE 2 TABLETS BY MOUTH EVERY MORNING AND 1 TABLET EVERY EVENING 12/05/18   McDiarmid, Blane Ohara, MD  metoprolol tartrate (LOPRESSOR) 100 MG tablet TAKE 1 TABLET (100 MG TOTAL) 2 (TWO) TIMES DAILY BY MOUTH. 08/03/18   McDiarmid, Blane Ohara, MD  nitroGLYCERIN (NITROSTAT) 0.4 MG SL tablet PLACE 1 TABLET (0.4 MG TOTAL) UNDER THE TONGUE EVERY 5 (FIVE) MINUTES AS NEEDED FOR CHEST PAIN. 07/28/17   McDiarmid, Blane Ohara, MD  predniSONE (DELTASONE) 5 MG tablet Take 3 tablets (15 mg total) by mouth daily with breakfast for 30  days. 03/23/19 04/22/19  McDiarmid, Blane Ohara, MD  rosuvastatin (CRESTOR) 20 MG tablet Take 0.5 tablets (10 mg total) by mouth daily. 03/28/19   McDiarmid, Blane Ohara, MD  vitamin B-12 (CYANOCOBALAMIN) 1000 MCG tablet Take 1,000 mcg by mouth daily.    [provider]  Vitamin D, Ergocalciferol, (DRISDOL) 50000 units CAPS capsule Take 50,000 Units by mouth once a week. 01/12/18   [provider]    Family History Family History  Problem Relation Age of Onset  . Cancer Mother        breast, dementia  . Dementia Mother   . Stroke Mother   . Cancer Father 83       colon  . Heart disease Brother   . Cancer Sister        lung  . Heart attack Sister   . Diabetes Brother     Social History Social History   Tobacco Use  . Smoking status: Never Smoker  . Smokeless tobacco: Never Used  Substance Use Topics  . Alcohol use: No  . Drug use: No     Allergies   Lisinopril   Review of Systems Review of Systems  Unable to perform ROS: Mental status change     Physical Exam Updated Vital Signs BP 100/84    Pulse 70   Temp (!) 97 F (36.1 C) (Rectal)   Resp 20   LMP  (LMP Unknown)   SpO2 98%   Physical Exam Vitals signs and nursing note reviewed.  Constitutional:      Appearance: She is well-developed. She is obese.  HENT:     Head: Normocephalic and atraumatic.     Right Ear: External ear normal.     Left Ear: External ear normal.     Nose: Nose normal.  Eyes:     General:        Right eye: No discharge.        Left eye: No discharge.  Cardiovascular:     Rate and Rhythm: Tachycardia present. Rhythm irregular.     Heart sounds: Normal heart sounds.  Pulmonary:     Effort: Pulmonary effort is normal.     Breath sounds: Normal breath sounds.  Abdominal:     Palpations: Abdomen is soft.     Tenderness: There is abdominal tenderness (lower, hard to localize).  Skin:    General: Skin is warm and dry.  Neurological:     Mental Status: She is alert.     Comments: Awake, alert, disoriented to month, day of week. 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 5/5 strength in BUE. 3/5 in BLE.  Psychiatric:        Mood and Affect: Mood is not anxious.      ED Treatments / Results  Labs (all labs ordered are listed, but only abnormal results are displayed) Labs Reviewed  COMPREHENSIVE METABOLIC PANEL - Abnormal; Notable for the following components:      Result Value   Sodium 130 (*)    Potassium >7.5 (*)    Chloride 90 (*)    CO2 11 (*)    Glucose, Bld 146 (*)    BUN 127 (*)    Creatinine, Ser 6.04 (*)    Calcium 7.5 (*)    Total Protein 6.4 (*)    Albumin 3.1 (*)    GFR calc non Af Amer 6 (*)    GFR calc Af Amer 7 (*)    Anion gap 29 (*)    All  other components within normal limits  CBC WITH DIFFERENTIAL/PLATELET - Abnormal; Notable for the following components:   WBC 14.2 (*)    RBC 3.49 (*)    Hemoglobin 9.4 (*)    HCT 31.2 (*)    RDW 19.2 (*)    Platelets 497 (*)    Neutro Abs 12.0 (*)    Abs Immature Granulocytes 0.20 (*)    All other components within normal  limits  URINALYSIS, ROUTINE W REFLEX MICROSCOPIC - Abnormal; Notable for the following components:   APPearance HAZY (*)    Hgb urine dipstick MODERATE (*)    Ketones, ur 5 (*)    Protein, ur 30 (*)    Leukocytes,Ua TRACE (*)    Bacteria, UA FEW (*)    All other components within normal limits  LACTIC ACID, PLASMA - Abnormal; Notable for the following components:   Lactic Acid, Venous 7.4 (*)    All other components within normal limits  CBG MONITORING, ED - Abnormal; Notable for the following components:   Glucose-Capillary 125 (*)    All other components within normal limits  CBG MONITORING, ED - Abnormal; Notable for the following components:   Glucose-Capillary 153 (*)    All other components within normal limits  SARS CORONAVIRUS 2 (HOSPITAL ORDER, [facility])  URINE CULTURE  SODIUM, URINE, RANDOM  CREATININE, URINE, RANDOM  LACTIC ACID, PLASMA  RENAL FUNCTION PANEL  RENAL FUNCTION PANEL  PROTIME-INR    EKG None  ED ECG REPORT   Date: 03/28/2019  Rate: 100  Rhythm: atrial fibrillation  QRS Axis: normal  Intervals: normal  ST/T Wave abnormalities: nonspecific T wave changes  Conduction Disutrbances:nonspecific intraventricular conduction delay  Narrative Interpretation:   Old EKG Reviewed: changes noted  I have personally reviewed the EKG tracing and agree with the computerized printout as noted.   Radiology Ct Abdomen Pelvis Wo Contrast  Result Date: 03/28/2019 CLINICAL DATA:  Generalized weakness, generalized body pain, diverticulitis suspected EXAM: CT ABDOMEN AND PELVIS WITHOUT CONTRAST TECHNIQUE: Multidetector CT imaging of the abdomen and pelvis was performed following the standard protocol without IV contrast. COMPARISON:  None. FINDINGS: Lower chest: Cardiomegaly. Extensive 3 vessel coronary artery calcifications. Hepatobiliary: No solid liver abnormality is seen. Gallstones. No gallbladder wall thickening, or biliary dilatation.  Pancreas: Unremarkable. No pancreatic ductal dilatation or surrounding inflammatory changes. Spleen: Normal in size without significant abnormality. Adrenals/Urinary Tract: Adrenal glands are unremarkable. Very atrophic right kidney. Unremarkable left kidney. No hydronephrosis. Bladder is unremarkable. Stomach/Bowel: Stomach is within normal limits. Appendix appears normal. No evidence of bowel wall thickening, distention, or inflammatory changes. Sigmoid diverticulosis. Vascular/Lymphatic: No significant vascular findings are present. No enlarged abdominal or pelvic lymph nodes. Reproductive: No mass or other significant abnormality. Other: No abdominal wall hernia or abnormality. No abdominopelvic ascites. Musculoskeletal: No acute or significant osseous findings. IMPRESSION: 1. No acute noncontrast CT findings of the abdomen or pelvis to explain abdominal pain. 2.  Sigmoid diverticulosis.  No evidence of acute diverticulitis. 3.  Gallstones.  No evidence of acute cholelithiasis. 4.  Other chronic and incidental findings as detailed above. Electronically Signed   By: Eddie Candle M.D.   On: 03/28/2019 19:08   Ct Head Wo Contrast  Result Date: 03/28/2019 CLINICAL DATA:  Generalized weakness x2 months. EXAM: CT HEAD WITHOUT CONTRAST TECHNIQUE: Contiguous axial images were obtained from the base of the skull through the vertex without intravenous contrast. COMPARISON:  None. FINDINGS: Brain: No evidence of acute infarction, hemorrhage, hydrocephalus,  extra-axial collection or mass lesion/mass effect. Age related volume loss and chronic microvascular ischemic changes are noted. Vascular: No hyperdense vessel or unexpected calcification. Skull: Normal. Negative for fracture or focal lesion. Sinuses/Orbits: No acute finding. Other: None. IMPRESSION: 1. No acute intracranial abnormality. 2. Age related volume loss and chronic microvascular ischemic changes are noted. Electronically Signed   By: Constance Holster  M.D.   On: 03/28/2019 18:54   Dg Chest Portable 1 View  Result Date: 03/28/2019 CLINICAL DATA:  Generalized weakness. EXAM: PORTABLE CHEST 1 VIEW COMPARISON:  Radiograph of Feb 15, 2019. FINDINGS: Stable cardiomegaly. Atherosclerosis of thoracic aorta is noted. No pneumothorax is noted. Right lung is clear. Probable small left pleural effusion is noted with associated atelectasis. Bony thorax is unremarkable. IMPRESSION: Stable cardiomegaly. Probable small left pleural effusion is noted with associated atelectasis. Aortic Atherosclerosis (ICD10-I70.0). Electronically Signed   By: Marijo Conception M.D.   On: 03/28/2019 16:35    Procedures .Critical Care Performed by: Sherwood Gambler, MD Authorized by: Sherwood Gambler, MD   Critical care provider statement:    Critical care time (minutes):  45   Critical care time was exclusive of:  Separately billable procedures and treating other patients   Critical care was necessary to treat or prevent imminent or life-threatening deterioration of the following conditions:  Renal failure and metabolic crisis   Critical care was time spent personally by me on the following activities:  Discussions with consultants, evaluation of patient's response to treatment, examination of patient, ordering and performing treatments and interventions, ordering and review of laboratory studies, ordering and review of radiographic studies, pulse oximetry, re-evaluation of patient's condition, obtaining history from patient or surrogate, review of old charts and development of treatment plan with patient or surrogate   (including critical care time)  Medications Ordered in ED Medications  sodium polystyrene (KAYEXALATE) 15 GM/60ML suspension 45 g (has no administration in time range)  sodium bicarbonate 150 mEq in dextrose 5 % 1,000 mL infusion ( Intravenous New Bag/Given 03/28/19 1846)  sodium polystyrene (KAYEXALATE) 15 GM/60ML suspension 60 g (has no administration in time  range)  dextrose 50 % solution (has no administration in time range)  lactated ringers bolus 1,000 mL (0 mLs Intravenous Stopped 03/28/19 1813)  lactated ringers bolus 1,000 mL (0 mLs Intravenous Stopped 03/28/19 2141)  insulin aspart (novoLOG) injection 5 Units (5 Units Intravenous Given 03/28/19 2143)    And  dextrose 50 % solution 50 mL (50 mLs Intravenous Given 03/28/19 2147)  calcium gluconate 1 g/ 50 mL sodium chloride IVPB (0 g Intravenous Stopped 03/28/19 2017)  sodium bicarbonate injection 50 mEq (50 mEq Intravenous Given 03/28/19 1837)     Initial Impression / Assessment and Plan / ED Course  I have reviewed the triage vital signs and the nursing notes.  Pertinent labs & imaging results that were available during my care of the patient were reviewed by me and considered in my medical decision making (see chart for details).  Clinical Course as of Mar 28 2147  Tue Mar 28, 2019  1749 Patient's lab work indicates severe renal failure.  Creatinine is 6, BUN over 100 and potassium over 7.5 with no obvious hemolysis.  She is severely acidotic.  She will be given more IV fluids.  Son reports she has been having a lot of diarrhea and is possible this is from dehydration.  I discussed that we definitely need to consider dialysis.  He states that she has been adamant before being confused  that she would not want dialysis and he would like to respect those wishes.  He understands that without dialysis she could die.  He would like other treatments in the meantime and see if her renal function improves. Wants her to be DNR.   [SG]  [id number] I discussed with Dr. Jimmy Footman.  He recommends Kayexalate 50 g now and again in 4 hours, 1 amp of sodium bicarb and isotonic sodium bicarb at 150 mL/hour.  If she does not want dialysis then she does not need to come to [facility].    [SG]    Clinical Course User Index [SG] Sherwood Gambler, MD       Altered mental state is likely due to the uremia.  No other obvious  findings.  No fever or clear finding for infection.  The lactic acid probably is due to severe dehydration and acidosis from her renal failure rather than an acute infection.  She will be given IV fluids and started on the bicarb drip as above.  I have discussed her critical illness with the son who seems understand.  Discussed with hospitalist who will admit.  Final Clinical Impressions(s) / ED Diagnoses   Final diagnoses:  Acute renal failure, unspecified acute renal failure type ([place])  Hyperkalemia  Uremia  Lactic acidosis    ED Discharge Orders    None       Sherwood Gambler, MD 03/28/19 2150

## 2019-03-28 NOTE — Addendum Note (Signed)
Addended by: Lissa Morales D on: 03/28/2019 09:42 AM   Modules accepted: Orders

## 2019-03-28 NOTE — ED Notes (Signed)
ED TO INPATIENT HANDOFF REPORT  Name/Age/Gender Abigail Duncan 78 y.o. female  Code Status Code Status History    Date Active Date Inactive Code Status Order ID Comments User Context   02/15/2019 1836 02/22/2019 0300 Partial Code [id number]  Kathrene Alu, MD ED   Advance Care Planning Activity    Questions for Most Recent Historical Code Status (Order [id number])    Question Answer Comment   In the event of cardiac or respiratory ARREST: Initiate Code Blue, Call Rapid Response Yes    In the event of cardiac or respiratory ARREST: Perform CPR Yes    In the event of cardiac or respiratory ARREST: Perform Intubation/Mechanical Ventilation No    In the event of cardiac or respiratory ARREST: Use NIPPV/BiPAp only if indicated Yes    In the event of cardiac or respiratory ARREST: Administer ACLS medications if indicated Yes    In the event of cardiac or respiratory ARREST: Perform Defibrillation or Cardioversion if indicated Yes       Home/SNF/Other Home  Chief Complaint Hallucinations, Weakness  Level of Care/Admitting Diagnosis ED Disposition    ED Disposition Condition [place]: [place] [[id number]]  Level of Care: Stepdown [14]  Admit to SDU based on following criteria: Severe physiological/psychological symptoms:  Any diagnosis requiring assessment & intervention at least every 4 hours on an ongoing basis to obtain desired patient outcomes including stability and rehabilitation  Covid Evaluation: Confirmed COVID Negative  Diagnosis: AKI (acute kidney injury) [facility]) [[id number]]  Admitting Physician: Harvie Bridge [[id number]]  Attending Physician: Sherron Monday  Estimated length of stay: past midnight tomorrow  Certification:: I certify this patient will need inpatient services for at least 2 midnights  PT Class (Do Not Modify): Inpatient [101]  PT Acc Code (Do Not Modify): Private [1]       Medical History Past  Medical History:  Diagnosis Date  . Anticoagulation goal of INR 2 to 3 07/15/2018   Atrial fibrillation, permanent  . Bilateral primary osteoarthritis of knee 03/23/2019  . Chronic pain syndrome 07/15/2018  . Degenerative arthritis of knee 04/2001   by xray  . Diabetes mellitus with proteinuria ([place]) 03/23/2019  . Diabetic peripheral neuropathy ([place]) 10/23/2013  . Diabetic peripheral neuropathy ([place]) 10/23/2013  . Elevated sed rate   . Impaired mobility 05/21/2017  . Kidney stone on right side 05/12/2016   [place] 05/2016 - non-obstructing right 6 mm stone  . Leg swelling 06/14/2015  . Lower urinary tract infectious disease   . Obstructive sleep apnea 05/19/2018   Sleep study showed some apnea and nighttime desaturations. However, did not meet criteria for CPAP.  Marland Kitchen [place] flesh 09/09/2018  . Right knee pain 07/23/2014  . Sciatica of left side 12/2003   DDD L5,S1   . Secondary hyperparathyroidism ([place]) 05/31/2017  . Shortness of breath 10/16/2016  . Spinal stenosis, lumbar 04/2004   mild on MRI at L 4-5    Allergies Allergies  Allergen Reactions  . Lisinopril     REACTION: bad dreams    IV Location/Drains/Wounds Patient Lines/Drains/Airways Status   Active Line/Drains/Airways    Name:   Placement date:   Placement time:   Site:   Days:   Peripheral IV 03/28/19 Right;Upper Arm   03/28/19    1610    Arm   less than 1   Peripheral IV 03/28/19 Left Other (Comment)   03/28/19    1828    Other (Comment)  less than 1   External Urinary Catheter   02/20/19    1326    -   36   Wound / Incision (Open or Dehisced) 02/20/19 Other (Comment) Hand Left;Posterior skin tear   02/20/19    1730    Hand   36   Wound / Incision (Open or Dehisced) 02/20/19 Other (Comment) Hand Posterior;Right skin tear   02/20/19    1730    Hand   36          Labs/Imaging Results for orders placed or performed during the hospital encounter of 03/28/19 (from the past 48 hour(s))  Urinalysis, Routine w reflex microscopic      Status: Abnormal   Collection Time: 03/28/19  3:53 PM  Result Value Ref Range   Color, Urine YELLOW YELLOW   APPearance HAZY (A) CLEAR   Specific Gravity, Urine 1.013 1.005 - 1.030   pH 5.0 5.0 - 8.0   Glucose, UA NEGATIVE NEGATIVE mg/dL   Hgb urine dipstick MODERATE (A) NEGATIVE   Bilirubin Urine NEGATIVE NEGATIVE   Ketones, ur 5 (A) NEGATIVE mg/dL   Protein, ur 30 (A) NEGATIVE mg/dL   Nitrite NEGATIVE NEGATIVE   Leukocytes,Ua TRACE (A) NEGATIVE   RBC / HPF 0-5 0 - 5 RBC/hpf   WBC, UA 0-5 0 - 5 WBC/hpf   Bacteria, UA FEW (A) NONE SEEN   Amorphous Crystal PRESENT     Comment: Performed at [facility], [place] [address], [place], [place] [address]  CBG monitoring, ED     Status: Abnormal   Collection Time: 03/28/19  4:01 PM  Result Value Ref Range   Glucose-Capillary 125 (H) 70 - 99 mg/dL  Comprehensive metabolic panel     Status: Abnormal   Collection Time: 03/28/19  4:11 PM  Result Value Ref Range   Sodium 130 (L) 135 - 145 mmol/L   Potassium >7.5 (HH) 3.5 - 5.1 mmol/L    Comment: REPEATED TO VERIFY NO VISIBLE HEMOLYSIS CRITICAL RESULT CALLED TO, READ BACK BY AND VERIFIED WITH: L.VAUGHN AT 1736 ON 03/28/19 BY N.THOMPSON    Chloride 90 (L) 98 - 111 mmol/L   CO2 11 (L) 22 - 32 mmol/L   Glucose, Bld 146 (H) 70 - 99 mg/dL   BUN 127 (H) 8 - 23 mg/dL    Comment: RESULTS CONFIRMED BY MANUAL DILUTION   Creatinine, Ser 6.04 (H) 0.44 - 1.00 mg/dL   Calcium 7.5 (L) 8.9 - 10.3 mg/dL   Total Protein 6.4 (L) 6.5 - 8.1 g/dL   Albumin 3.1 (L) 3.5 - 5.0 g/dL   AST 33 15 - 41 U/L   ALT 31 0 - 44 U/L   Alkaline Phosphatase 75 38 - 126 U/L   Total Bilirubin 0.9 0.3 - 1.2 mg/dL   GFR calc non Af Amer 6 (L) >60 mL/min   GFR calc Af Amer 7 (L) >60 mL/min   Anion gap 29 (H) 5 - 15    Comment: REPEATED TO VERIFY Performed at [facility], [place] [address], [place], [place] [address]   CBC with Differential     Status: Abnormal   Collection Time: 03/28/19   4:11 PM  Result Value Ref Range   WBC 14.2 (H) 4.0 - 10.5 K/uL   RBC 3.49 (L) 3.87 - 5.11 MIL/uL   Hemoglobin 9.4 (L) 12.0 - 15.0 g/dL   HCT 31.2 (L) 36.0 - 46.0 %   MCV 89.4 80.0 - 100.0 fL   MCH  26.9 26.0 - 34.0 pg   MCHC 30.1 30.0 - 36.0 g/dL   RDW 19.2 (H) 11.5 - 15.5 %   Platelets 497 (H) 150 - 400 K/uL   nRBC 0.0 0.0 - 0.2 %   Neutrophils Relative % 85 %   Neutro Abs 12.0 (H) 1.7 - 7.7 K/uL   Lymphocytes Relative 7 %   Lymphs Abs 1.0 0.7 - 4.0 K/uL   Monocytes Relative 7 %   Monocytes Absolute 1.0 0.1 - 1.0 K/uL   Eosinophils Relative 0 %   Eosinophils Absolute 0.0 0.0 - 0.5 K/uL   Basophils Relative 0 %   Basophils Absolute 0.0 0.0 - 0.1 K/uL   Immature Granulocytes 1 %   Abs Immature Granulocytes 0.20 (H) 0.00 - 0.07 K/uL    Comment: Performed at [facility], [place] [address] [place], [place] [address]  SARS Coronavirus 2 (CEPHEID - Performed in [place] hospital lab), Hosp Order     Status: None   Collection Time: 03/28/19  4:11 PM   Specimen: Nasopharyngeal Swab  Result Value Ref Range   SARS Coronavirus 2 NEGATIVE NEGATIVE    Comment: (NOTE) If result is NEGATIVE SARS-CoV-2 target nucleic acids are NOT DETECTED. The SARS-CoV-2 RNA is generally detectable in upper and lower  respiratory specimens during the acute phase of infection. The lowest  concentration of SARS-CoV-2 viral copies this assay can detect is 250  copies / mL. A negative result does not preclude SARS-CoV-2 infection  and should not be used as the sole basis for treatment or other  patient management decisions.  A negative result may occur with  improper specimen collection / handling, submission of specimen other  than nasopharyngeal swab, presence of viral mutation(s) within the  areas targeted by this assay, and inadequate number of viral copies  (<250 copies / mL). A negative result must be combined with clinical  observations, patient history, and epidemiological  information. If result is POSITIVE SARS-CoV-2 target nucleic acids are DETECTED. The SARS-CoV-2 RNA is generally detectable in upper and lower  respiratory specimens dur ing the acute phase of infection.  Positive  results are indicative of active infection with SARS-CoV-2.  Clinical  correlation with patient history and other diagnostic information is  necessary to determine patient infection status.  Positive results do  not rule out bacterial infection or co-infection with other viruses. If result is PRESUMPTIVE POSTIVE SARS-CoV-2 nucleic acids MAY BE PRESENT.   A presumptive positive result was obtained on the submitted specimen  and confirmed on repeat testing.  While 2019 novel coronavirus  (SARS-CoV-2) nucleic acids may be present in the submitted sample  additional confirmatory testing may be necessary for epidemiological  and / or clinical management purposes  to differentiate between  SARS-CoV-2 and other Sarbecovirus currently known to infect humans.  If clinically indicated additional testing with an alternate test  methodology [id number]) is advised. The SARS-CoV-2 RNA is generally  detectable in upper and lower respiratory sp ecimens during the acute  phase of infection. The expected result is Negative. Fact Sheet for Patients:  StrictlyIdeas.no Fact Sheet for Healthcare Providers: BankingDealers.co.za This test is not yet approved or cleared by the [place] FDA and has been authorized for detection and/or diagnosis of SARS-CoV-2 by FDA under an Emergency Use Authorization (EUA).  This EUA will remain in effect (meaning this test can be used) for the duration of the COVID-19 declaration under Section 564(b)(1) of the Act, 21 U.S.C. section 360bbb-3(b)(1), unless the  authorization is terminated or revoked sooner. Performed at [facility], [place] [address] [place], [place] [address]   Lactic acid,  plasma     Status: Abnormal   Collection Time: 03/28/19  6:34 PM  Result Value Ref Range   Lactic Acid, Venous 7.4 (HH) 0.5 - 1.9 mmol/L    Comment: CRITICAL RESULT CALLED TO, READ BACK BY AND VERIFIED WITH: H.LACIVITA AT 1919 ON 03/28/19 BY N.THOMPSON Performed at [facility], [place] [address], [place], [place] [address]   Sodium, urine, random     Status: None   Collection Time: 03/28/19  7:25 PM  Result Value Ref Range   Sodium, Ur 17 mmol/L    Comment: Performed at [facility], [place] [address] [place], [place] [address]  Creatinine, urine, random     Status: None   Collection Time: 03/28/19  7:25 PM  Result Value Ref Range   Creatinine, Urine 68.64 mg/dL    Comment: Performed at [facility], [place] [address] [place], [place] [address]  CBG monitoring, ED     Status: Abnormal   Collection Time: 03/28/19  8:55 PM  Result Value Ref Range   Glucose-Capillary 153 (H) 70 - 99 mg/dL   Ct Abdomen Pelvis Wo Contrast  Result Date: 03/28/2019 CLINICAL DATA:  Generalized weakness, generalized body pain, diverticulitis suspected EXAM: CT ABDOMEN AND PELVIS WITHOUT CONTRAST TECHNIQUE: Multidetector CT imaging of the abdomen and pelvis was performed following the standard protocol without IV contrast. COMPARISON:  None. FINDINGS: Lower chest: Cardiomegaly. Extensive 3 vessel coronary artery calcifications. Hepatobiliary: No solid liver abnormality is seen. Gallstones. No gallbladder wall thickening, or biliary dilatation. Pancreas: Unremarkable. No pancreatic ductal dilatation or surrounding inflammatory changes. Spleen: Normal in size without significant abnormality. Adrenals/Urinary Tract: Adrenal glands are unremarkable. Very atrophic right kidney. Unremarkable left kidney. No hydronephrosis. Bladder is unremarkable. Stomach/Bowel: Stomach is within normal limits. Appendix appears normal. No evidence of bowel wall thickening, distention, or  inflammatory changes. Sigmoid diverticulosis. Vascular/Lymphatic: No significant vascular findings are present. No enlarged abdominal or pelvic lymph nodes. Reproductive: No mass or other significant abnormality. Other: No abdominal wall hernia or abnormality. No abdominopelvic ascites. Musculoskeletal: No acute or significant osseous findings. IMPRESSION: 1. No acute noncontrast CT findings of the abdomen or pelvis to explain abdominal pain. 2.  Sigmoid diverticulosis.  No evidence of acute diverticulitis. 3.  Gallstones.  No evidence of acute cholelithiasis. 4.  Other chronic and incidental findings as detailed above. Electronically Signed   By: Eddie Candle M.D.   On: 03/28/2019 19:08   Ct Head Wo Contrast  Result Date: 03/28/2019 CLINICAL DATA:  Generalized weakness x2 months. EXAM: CT HEAD WITHOUT CONTRAST TECHNIQUE: Contiguous axial images were obtained from the base of the skull through the vertex without intravenous contrast. COMPARISON:  None. FINDINGS: Brain: No evidence of acute infarction, hemorrhage, hydrocephalus, extra-axial collection or mass lesion/mass effect. Age related volume loss and chronic microvascular ischemic changes are noted. Vascular: No hyperdense vessel or unexpected calcification. Skull: Normal. Negative for fracture or focal lesion. Sinuses/Orbits: No acute finding. Other: None. IMPRESSION: 1. No acute intracranial abnormality. 2. Age related volume loss and chronic microvascular ischemic changes are noted. Electronically Signed   By: Constance Holster M.D.   On: 03/28/2019 18:54   Dg Chest Portable 1 View  Result Date: 03/28/2019 CLINICAL DATA:  Generalized weakness. EXAM: PORTABLE CHEST 1 VIEW COMPARISON:  Radiograph of Feb 15, 2019. FINDINGS: Stable cardiomegaly. Atherosclerosis of thoracic aorta is  noted. No pneumothorax is noted. Right lung is clear. Probable small left pleural effusion is noted with associated atelectasis. Bony thorax is unremarkable. IMPRESSION:  Stable cardiomegaly. Probable small left pleural effusion is noted with associated atelectasis. Aortic Atherosclerosis (ICD10-I70.0). Electronically Signed   By: Marijo Conception M.D.   On: 03/28/2019 16:35    Pending Labs Unresulted Labs (From admission, onward)    Start     Ordered   03/29/19 0500  Renal function panel  Tomorrow morning,   R     03/28/19 1925   03/28/19 2300  Renal function panel  Once-Timed,   STAT     03/28/19 1925   03/28/19 2139  Protime-INR  ONCE - STAT,   STAT     03/28/19 2138   03/28/19 1751  Lactic acid, plasma  Now then every 2 hours,   STAT     03/28/19 1750   03/28/19 1533  Urine culture  ONCE - STAT,   STAT     03/28/19 1533   Signed and Held  Magnesium  Add-on,   R     Signed and Held   Signed and Held  Phosphorus  Add-on,   R     Signed and Held   Signed and Held  CBC  Tomorrow morning,   R     Signed and Held   Signed and Held  CBC  (heparin)  Once,   R    Comments: Baseline for heparin therapy IF NOT ALREADY DRAWN.  Notify MD if PLT < 100 K.    Signed and Held   Signed and Held  Creatinine, serum  (heparin)  Once,   R    Comments: Baseline for heparin therapy IF NOT ALREADY DRAWN.    Signed and Held   Signed and Held  Comprehensive metabolic panel  Tomorrow morning,   R     Signed and Held          Vitals/Pain Today's Vitals   03/28/19 1830 03/28/19 1921 03/28/19 2000 03/28/19 2130  BP: (!) 165/150 107/67 100/84 96/61  Pulse: 99 76 70 76  Resp: 16 15 20 14   Temp:      TempSrc:      SpO2: (!) 72% 100% 98% 100%  PainSc:        Isolation Precautions No active isolations  Medications Medications  sodium polystyrene (KAYEXALATE) 15 GM/60ML suspension 45 g (has no administration in time range)  sodium bicarbonate 150 mEq in dextrose 5 % 1,000 mL infusion ( Intravenous New Bag/Given 03/28/19 1846)  sodium polystyrene (KAYEXALATE) 15 GM/60ML suspension 60 g (has no administration in time range)  dextrose 50 % solution (has no  administration in time range)  lactated ringers bolus 1,000 mL (0 mLs Intravenous Stopped 03/28/19 1813)  lactated ringers bolus 1,000 mL (0 mLs Intravenous Stopped 03/28/19 2141)  insulin aspart (novoLOG) injection 5 Units (5 Units Intravenous Given 03/28/19 2143)    And  dextrose 50 % solution 50 mL (50 mLs Intravenous Given 03/28/19 2147)  calcium gluconate 1 g/ 50 mL sodium chloride IVPB (0 g Intravenous Stopped 03/28/19 2017)  sodium bicarbonate injection 50 mEq (50 mEq Intravenous Given 03/28/19 1837)    Mobility non-ambulatory

## 2019-03-28 NOTE — Telephone Encounter (Signed)
Abigail Duncan calls with 4 request:  1. Verbal orders for OT 2 time(s) weekly for 1 week(s), then              1 time(s) weekly for 4 week(s)  2. HH aide for bathing 2 time(s) weekly for 4 week(s)  3. Verbal order for Social work eval  4. Script for a hoyer life faxed to [facility] @ [phone]   You can leave verbal orders on confidential voicemail.  Christen Bame, CMA

## 2019-03-28 NOTE — Telephone Encounter (Signed)
Onetouch glucometer, strips and lancets sent into pharamcy

## 2019-03-28 NOTE — ED Notes (Signed)
Bed: WA09 Expected date:  Expected time:  Means of arrival:  Comments: EMS 77yo hallucinations, weakness

## 2019-03-28 NOTE — ED Notes (Signed)
Date and time results received: 03/28/19 Abigail Duncan (use smartphrase ".now" to insert current time)  Test: Lactic  Critical Value: 7.4  Name of Provider Notified: Regenia Skeeter  Orders Received? Or Actions Taken?: Provider made aware

## 2019-03-28 NOTE — Addendum Note (Signed)
Addended byWendy Poet, Josefine Fuhr D on: 03/28/2019 09:08 AM   Modules accepted: Orders

## 2019-03-28 NOTE — Consult Note (Signed)
Reason for Consult:AKI, Hyperkalemia Referring Physician: [name] is an 78 y.o. female.  HPI: 78 yr female with hx DM, severe neuropathy from that, massive obesity, OSA, DJD, Chronic pain syndrome, sciatica, spinal stenosis, DJD of Knees, Afib, chronic anticoag, ??PMR, recent UTI with hospitalization, bedbound existence, and recent d/c from SNF.  Cared for at home by son.  Weaker, progressive confusion over a couple of days.  Diarrhea x 3-5 d. More weakness. Brought to ED and found to  have Cr of 6.04/BUN 127, bicarb 11, AGMA 29.  Baseline Cr is 1.4-1.6.   She denies voiding sx but is confused.  Family not present. Denies SOB, or edema. Has seen Dr. Posey Pronto in our office, last time over a Yr ago. Missed last visit.  According to ED MD, son says she desires no dialysis.  Home med list includes Enalapril, KCl, and Metformen. Review of systems not obtained due to patient factors.   Past Medical History:  Diagnosis Date  . Anticoagulation goal of INR 2 to 3 07/15/2018   Atrial fibrillation, permanent  . Bilateral primary osteoarthritis of knee 03/23/2019  . Chronic pain syndrome 07/15/2018  . Degenerative arthritis of knee 04/2001   by xray  . Diabetes mellitus with proteinuria ([place]) 03/23/2019  . Diabetic peripheral neuropathy ([place]) 10/23/2013  . Diabetic peripheral neuropathy ([place]) 10/23/2013  . Elevated sed rate   . Impaired mobility 05/21/2017  . Kidney stone on right side 05/12/2016   [place] 05/2016 - non-obstructing right 6 mm stone  . Leg swelling 06/14/2015  . Lower urinary tract infectious disease   . Obstructive sleep apnea 05/19/2018   Sleep study showed some apnea and nighttime desaturations. However, did not meet criteria for CPAP.  Marland Kitchen [place] flesh 09/09/2018  . Right knee pain 07/23/2014  . Sciatica of left side 12/2003   DDD L5,S1   . Secondary hyperparathyroidism ([place]) 05/31/2017  . Shortness of breath 10/16/2016  . Spinal stenosis, lumbar 04/2004   mild on MRI at L 4-5     Past Surgical History:  Procedure Laterality Date  . benign breast tumor     Excised 40+ years ago  . COLPOSCOPY  03/1991   dysplasia  . PTCA (aka ANGIOPLASTY)  07/1993   RCA    Family History  Problem Relation Age of Onset  . Cancer Mother        breast, dementia  . Dementia Mother   . Stroke Mother   . Cancer Father 50       colon  . Heart disease Brother   . Cancer Sister        lung  . Heart attack Sister   . Diabetes Brother     Social History:  reports that she has never smoked. She has never used smokeless tobacco. She reports that she does not drink alcohol or use drugs.  Allergies:  Allergies  Allergen Reactions  . Lisinopril     REACTION: bad dreams    Medications: I have reviewed the patient's current medications. Prior to Admission: (Not in a hospital admission)     Results for orders placed or performed during the hospital encounter of 03/28/19 (from the past 48 hour(s))  Urinalysis, Routine w reflex microscopic     Status: Abnormal   Collection Time: 03/28/19  3:53 PM  Result Value Ref Range   Color, Urine YELLOW YELLOW   APPearance HAZY (A) CLEAR   Specific Gravity, Urine 1.013 1.005 - 1.030   pH 5.0  5.0 - 8.0   Glucose, UA NEGATIVE NEGATIVE mg/dL   Hgb urine dipstick MODERATE (A) NEGATIVE   Bilirubin Urine NEGATIVE NEGATIVE   Ketones, ur 5 (A) NEGATIVE mg/dL   Protein, ur 30 (A) NEGATIVE mg/dL   Nitrite NEGATIVE NEGATIVE   Leukocytes,Ua TRACE (A) NEGATIVE   RBC / HPF 0-5 0 - 5 RBC/hpf   WBC, UA 0-5 0 - 5 WBC/hpf   Bacteria, UA FEW (A) NONE SEEN   Amorphous Crystal PRESENT     Comment: Performed at [facility], [place] [address] [place], [place] [address]  CBG monitoring, ED     Status: Abnormal   Collection Time: 03/28/19  4:01 PM  Result Value Ref Range   Glucose-Capillary 125 (H) 70 - 99 mg/dL  Comprehensive metabolic panel     Status: Abnormal   Collection Time: 03/28/19  4:11 PM  Result Value Ref Range    Sodium 130 (L) 135 - 145 mmol/L   Potassium >7.5 (HH) 3.5 - 5.1 mmol/L    Comment: REPEATED TO VERIFY NO VISIBLE HEMOLYSIS CRITICAL RESULT CALLED TO, READ BACK BY AND VERIFIED WITH: L.VAUGHN AT 1736 ON 03/28/19 BY N.THOMPSON    Chloride 90 (L) 98 - 111 mmol/L   CO2 11 (L) 22 - 32 mmol/L   Glucose, Bld 146 (H) 70 - 99 mg/dL   BUN 127 (H) 8 - 23 mg/dL    Comment: RESULTS CONFIRMED BY MANUAL DILUTION   Creatinine, Ser 6.04 (H) 0.44 - 1.00 mg/dL   Calcium 7.5 (L) 8.9 - 10.3 mg/dL   Total Protein 6.4 (L) 6.5 - 8.1 g/dL   Albumin 3.1 (L) 3.5 - 5.0 g/dL   AST 33 15 - 41 U/L   ALT 31 0 - 44 U/L   Alkaline Phosphatase 75 38 - 126 U/L   Total Bilirubin 0.9 0.3 - 1.2 mg/dL   GFR calc non Af Amer 6 (L) >60 mL/min   GFR calc Af Amer 7 (L) >60 mL/min   Anion gap 29 (H) 5 - 15    Comment: REPEATED TO VERIFY Performed at [facility], [place] [address] [place], [place] [address]   CBC with Differential     Status: Abnormal   Collection Time: 03/28/19  4:11 PM  Result Value Ref Range   WBC 14.2 (H) 4.0 - 10.5 K/uL   RBC 3.49 (L) 3.87 - 5.11 MIL/uL   Hemoglobin 9.4 (L) 12.0 - 15.0 g/dL   HCT 31.2 (L) 36.0 - 46.0 %   MCV 89.4 80.0 - 100.0 fL   MCH 26.9 26.0 - 34.0 pg   MCHC 30.1 30.0 - 36.0 g/dL   RDW 19.2 (H) 11.5 - 15.5 %   Platelets 497 (H) 150 - 400 K/uL   nRBC 0.0 0.0 - 0.2 %   Neutrophils Relative % 85 %   Neutro Abs 12.0 (H) 1.7 - 7.7 K/uL   Lymphocytes Relative 7 %   Lymphs Abs 1.0 0.7 - 4.0 K/uL   Monocytes Relative 7 %   Monocytes Absolute 1.0 0.1 - 1.0 K/uL   Eosinophils Relative 0 %   Eosinophils Absolute 0.0 0.0 - 0.5 K/uL   Basophils Relative 0 %   Basophils Absolute 0.0 0.0 - 0.1 K/uL   Immature Granulocytes 1 %   Abs Immature Granulocytes 0.20 (H) 0.00 - 0.07 K/uL    Comment: Performed at [facility], [place] [address], [place], [place] [address]  SARS Coronavirus 2 (CEPHEID - Performed in  [facility] hospital lab), Hosp Order      Status: None   Collection Time: 03/28/19  4:11 PM   Specimen: Nasopharyngeal Swab  Result Value Ref Range   SARS Coronavirus 2 NEGATIVE NEGATIVE    Comment: (NOTE) If result is NEGATIVE SARS-CoV-2 target nucleic acids are NOT DETECTED. The SARS-CoV-2 RNA is generally detectable in upper and lower  respiratory specimens during the acute phase of infection. The lowest  concentration of SARS-CoV-2 viral copies this assay can detect is 250  copies / mL. A negative result does not preclude SARS-CoV-2 infection  and should not be used as the sole basis for treatment or other  patient management decisions.  A negative result may occur with  improper specimen collection / handling, submission of specimen other  than nasopharyngeal swab, presence of viral mutation(s) within the  areas targeted by this assay, and inadequate number of viral copies  (<250 copies / mL). A negative result must be combined with clinical  observations, patient history, and epidemiological information. If result is POSITIVE SARS-CoV-2 target nucleic acids are DETECTED. The SARS-CoV-2 RNA is generally detectable in upper and lower  respiratory specimens dur ing the acute phase of infection.  Positive  results are indicative of active infection with SARS-CoV-2.  Clinical  correlation with patient history and other diagnostic information is  necessary to determine patient infection status.  Positive results do  not rule out bacterial infection or co-infection with other viruses. If result is PRESUMPTIVE POSTIVE SARS-CoV-2 nucleic acids MAY BE PRESENT.   A presumptive positive result was obtained on the submitted specimen  and confirmed on repeat testing.  While 2019 novel coronavirus  (SARS-CoV-2) nucleic acids may be present in the submitted sample  additional confirmatory testing may be necessary for epidemiological  and / or clinical management purposes  to differentiate between  SARS-CoV-2 and other Sarbecovirus  currently known to infect humans.  If clinically indicated additional testing with an alternate test  methodology [phone]) is advised. The SARS-CoV-2 RNA is generally  detectable in upper and lower respiratory sp ecimens during the acute  phase of infection. The expected result is Negative. Fact Sheet for Patients:  StrictlyIdeas.no Fact Sheet for Healthcare Providers: BankingDealers.co.za This test is not yet approved or cleared by the [place] FDA and has been authorized for detection and/or diagnosis of SARS-CoV-2 by FDA under an Emergency Use Authorization (EUA).  This EUA will remain in effect (meaning this test can be used) for the duration of the COVID-19 declaration under Section 564(b)(1) of the Act, 21 U.S.C. section 360bbb-3(b)(1), unless the authorization is terminated or revoked sooner. Performed at [facility], [place] [address] [place], [place] [address]     Ct Head Wo Contrast  Result Date: 03/28/2019 CLINICAL DATA:  Generalized weakness x2 months. EXAM: CT HEAD WITHOUT CONTRAST TECHNIQUE: Contiguous axial images were obtained from the base of the skull through the vertex without intravenous contrast. COMPARISON:  None. FINDINGS: Brain: No evidence of acute infarction, hemorrhage, hydrocephalus, extra-axial collection or mass lesion/mass effect. Age related volume loss and chronic microvascular ischemic changes are noted. Vascular: No hyperdense vessel or unexpected calcification. Skull: Normal. Negative for fracture or focal lesion. Sinuses/Orbits: No acute finding. Other: None. IMPRESSION: 1. No acute intracranial abnormality. 2. Age related volume loss and chronic microvascular ischemic changes are noted. Electronically Signed   By: Constance Holster M.D.   On: 03/28/2019 18:54   Dg Chest Portable 1 View  Result Date: 03/28/2019 CLINICAL DATA:  Generalized weakness. EXAM: PORTABLE CHEST 1 VIEW  COMPARISON:  Radiograph of Feb 15, 2019. FINDINGS: Stable cardiomegaly. Atherosclerosis of thoracic aorta is noted. No pneumothorax is noted. Right lung is clear. Probable small left pleural effusion is noted with associated atelectasis. Bony thorax is unremarkable. IMPRESSION: Stable cardiomegaly. Probable small left pleural effusion is noted with associated atelectasis. Aortic Atherosclerosis (ICD10-I70.0). Electronically Signed   By: Marijo Conception M.D.   On: 03/28/2019 16:35    ROS Blood pressure (!) 165/150, pulse 99, temperature (!) 97 F (36.1 C), temperature source Rectal, resp. rate 16, SpO2 (!) 72 %. Physical Exam Physical Examination: General appearance - pale, morbid obesity Mental status - Ox1, confused, does not answer questions consistently Eyes - pupils equal and reactive, extraocular eye movements intact Mouth - dental hygiene poor Neck - adenopathy noted PCL Lymphatics - posterior cervical nodes Chest - rales noted bibasilar, decreased air entry noted bilat Heart - irregularly irregular rhythm with rate 55M, systolic murmur ZT8/6 at 2nd left intercostal space Abdomen - very obese, pos bs, soft Extremities - pedal edema 1 +, decreased DP, bluish hue to feet Skin - bruises, trophic changes distally  Assessment/Plan: 1 AKI  Vol depletion and ACEI, with KCl, and Metformen aggravating.  Need to tx conservatively with her wishes. Will use bicarb, ivf, Kayex, Ca. Not candidate for long term RRT 2 CKD4 with ischemic nephropathy 3 Hypertension: not an issue. Avoid ACEI 4. Anemia will hold off eval at this time 5. Metabolic Bone Disease: on meds 6 DM needs control 7 massive obesity 8 Confusion prob metabolic 9 Chronic pain 10 severe debill. P bicarb, ivf, Kayex, Ca, glu, Insulin,  foley  Abigail Duncan 03/28/2019, 7:07 PM

## 2019-03-28 NOTE — ED Notes (Addendum)
Staff attempted to insert foley without success. Attempted to reposition pt while inserting but pt in too much pain.

## 2019-03-28 NOTE — Telephone Encounter (Signed)
I contacted patient's [id number] They have no record of Assure meter for patient Will send in strips for Assure meter, but also keep the Rxs for Onetouch meter and strips and lancets.

## 2019-03-28 NOTE — Addendum Note (Signed)
Addended byWendy Poet, Keil Pickering D on: 03/28/2019 09:00 AM   Modules accepted: Orders

## 2019-03-28 NOTE — ED Notes (Signed)
Family at bedside. 

## 2019-03-29 ENCOUNTER — Inpatient Hospital Stay (HOSPITAL_COMMUNITY): Payer: Medicare Other

## 2019-03-29 ENCOUNTER — Other Ambulatory Visit: Payer: Self-pay

## 2019-03-29 ENCOUNTER — Other Ambulatory Visit: Payer: Self-pay | Admitting: *Deleted

## 2019-03-29 DIAGNOSIS — E1149 Type 2 diabetes mellitus with other diabetic neurological complication: Secondary | ICD-10-CM

## 2019-03-29 DIAGNOSIS — E7800 Pure hypercholesterolemia, unspecified: Secondary | ICD-10-CM

## 2019-03-29 LAB — COMPREHENSIVE METABOLIC PANEL
ALT: 28 U/L (ref 0–44)
AST: 29 U/L (ref 15–41)
Albumin: 2.6 g/dL — ABNORMAL LOW (ref 3.5–5.0)
Alkaline Phosphatase: 65 U/L (ref 38–126)
Anion gap: 20 — ABNORMAL HIGH (ref 5–15)
BUN: 116 mg/dL — ABNORMAL HIGH (ref 8–23)
CO2: 22 mmol/L (ref 22–32)
Calcium: 6.8 mg/dL — ABNORMAL LOW (ref 8.9–10.3)
Chloride: 93 mmol/L — ABNORMAL LOW (ref 98–111)
Creatinine, Ser: 4.82 mg/dL — ABNORMAL HIGH (ref 0.44–1.00)
GFR calc Af Amer: 10 mL/min — ABNORMAL LOW (ref >60)
GFR calc non Af Amer: 8 mL/min — ABNORMAL LOW (ref >60)
Glucose, Bld: 262 mg/dL — ABNORMAL HIGH (ref 70–99)
Potassium: 5.9 mmol/L — ABNORMAL HIGH (ref 3.5–5.1)
Sodium: 135 mmol/L (ref 135–145)
Total Bilirubin: 0.5 mg/dL (ref 0.3–1.2)
Total Protein: 5.1 g/dL — ABNORMAL LOW (ref 6.5–8.1)

## 2019-03-29 LAB — BASIC METABOLIC PANEL
Anion gap: 24 — ABNORMAL HIGH (ref 5–15)
Anion gap: 20 — ABNORMAL HIGH (ref 5–15)
Anion gap: 20 — ABNORMAL HIGH (ref 5–15)
BUN: 109 mg/dL — ABNORMAL HIGH (ref 8–23)
BUN: 115 mg/dL — ABNORMAL HIGH (ref 8–23)
BUN: 109 mg/dL — ABNORMAL HIGH (ref 8–23)
CO2: 17 mmol/L — ABNORMAL LOW (ref 22–32)
CO2: 22 mmol/L (ref 22–32)
CO2: 23 mmol/L (ref 22–32)
Calcium: 7.1 mg/dL — ABNORMAL LOW (ref 8.9–10.3)
Calcium: 6.5 mg/dL — ABNORMAL LOW (ref 8.9–10.3)
Calcium: 6.5 mg/dL — ABNORMAL LOW (ref 8.9–10.3)
Chloride: 94 mmol/L — ABNORMAL LOW (ref 98–111)
Chloride: 96 mmol/L — ABNORMAL LOW (ref 98–111)
Chloride: 94 mmol/L — ABNORMAL LOW (ref 98–111)
Creatinine, Ser: 5.32 mg/dL — ABNORMAL HIGH (ref 0.44–1.00)
Creatinine, Ser: 4.73 mg/dL — ABNORMAL HIGH (ref 0.44–1.00)
Creatinine, Ser: 3.98 mg/dL — ABNORMAL HIGH (ref 0.44–1.00)
GFR calc Af Amer: 8 mL/min — ABNORMAL LOW (ref >60)
GFR calc Af Amer: 10 mL/min — ABNORMAL LOW (ref >60)
GFR calc Af Amer: 12 mL/min — ABNORMAL LOW (ref >60)
GFR calc non Af Amer: 7 mL/min — ABNORMAL LOW (ref >60)
GFR calc non Af Amer: 8 mL/min — ABNORMAL LOW (ref >60)
GFR calc non Af Amer: 10 mL/min — ABNORMAL LOW (ref >60)
Glucose, Bld: 252 mg/dL — ABNORMAL HIGH (ref 70–99)
Glucose, Bld: 249 mg/dL — ABNORMAL HIGH (ref 70–99)
Glucose, Bld: 180 mg/dL — ABNORMAL HIGH (ref 70–99)
Potassium: 6.9 mmol/L (ref 3.5–5.1)
Potassium: 4.9 mmol/L (ref 3.5–5.1)
Potassium: 4.9 mmol/L (ref 3.5–5.1)
Sodium: 135 mmol/L (ref 135–145)
Sodium: 138 mmol/L (ref 135–145)
Sodium: 137 mmol/L (ref 135–145)

## 2019-03-29 LAB — CBC
HCT: 25.6 % — ABNORMAL LOW (ref 36.0–46.0)
Hemoglobin: 8.1 g/dL — ABNORMAL LOW (ref 12.0–15.0)
MCH: 27.3 pg (ref 26.0–34.0)
MCHC: 31.6 g/dL (ref 30.0–36.0)
MCV: 86.2 fL (ref 80.0–100.0)
Platelets: 358 10*3/uL (ref 150–400)
RBC: 2.97 MIL/uL — ABNORMAL LOW (ref 3.87–5.11)
RDW: 19 % — ABNORMAL HIGH (ref 11.5–15.5)
WBC: 9.7 10*3/uL (ref 4.0–10.5)
nRBC: 0.2 % (ref 0.0–0.2)

## 2019-03-29 LAB — HEMOGLOBIN A1C
Hgb A1c MFr Bld: 6.8 % — ABNORMAL HIGH (ref 4.8–5.6)
Mean Plasma Glucose: 148.46 mg/dL

## 2019-03-29 LAB — IRON AND TIBC
Iron: 47 ug/dL (ref 28–170)
Saturation Ratios: 16 % (ref 10.4–31.8)
TIBC: 299 ug/dL (ref 250–450)
UIBC: 252 ug/dL

## 2019-03-29 LAB — PHOSPHORUS
Phosphorus: 8.6 mg/dL — ABNORMAL HIGH (ref 2.5–4.6)
Phosphorus: 6.9 mg/dL — ABNORMAL HIGH (ref 2.5–4.6)

## 2019-03-29 LAB — GLUCOSE, CAPILLARY
Glucose-Capillary: 284 mg/dL — ABNORMAL HIGH (ref 70–99)
Glucose-Capillary: 211 mg/dL — ABNORMAL HIGH (ref 70–99)

## 2019-03-29 LAB — LACTIC ACID, PLASMA
Lactic Acid, Venous: 7 mmol/L (ref 0.5–1.9)
Lactic Acid, Venous: 5.6 mmol/L (ref 0.5–1.9)
Lactic Acid, Venous: 5.7 mmol/L (ref 0.5–1.9)
Lactic Acid, Venous: 4 mmol/L (ref 0.5–1.9)

## 2019-03-29 LAB — ALBUMIN: Albumin: 2.7 g/dL — ABNORMAL LOW (ref 3.5–5.0)

## 2019-03-29 LAB — MRSA PCR SCREENING: MRSA by PCR: NEGATIVE

## 2019-03-29 LAB — MAGNESIUM: Magnesium: 2.1 mg/dL (ref 1.7–2.4)

## 2019-03-29 LAB — VITAMIN B12: Vitamin B-12: 2337 pg/mL — ABNORMAL HIGH (ref 180–914)

## 2019-03-29 LAB — FERRITIN: Ferritin: 43 ng/mL (ref 11–307)

## 2019-03-29 MED ORDER — ALPRAZOLAM 0.5 MG PO TABS
0.5000 mg | ORAL_TABLET | Freq: Once | ORAL | Status: AC
  Administered 2019-03-29: 0.5 mg via ORAL
  Filled 2019-03-29: qty 1

## 2019-03-29 MED ORDER — INSULIN ASPART 100 UNIT/ML IV SOLN
5.0000 [IU] | Freq: Once | INTRAVENOUS | Status: AC
  Administered 2019-03-29: 05:00:00 5 [IU] via INTRAVENOUS

## 2019-03-29 MED ORDER — DEXTROSE 50 % IV SOLN
1.0000 | Freq: Once | INTRAVENOUS | Status: AC
  Administered 2019-03-29: 50 mL via INTRAVENOUS
  Filled 2019-03-29: qty 50

## 2019-03-29 MED ORDER — SODIUM CHLORIDE 0.9 % IV BOLUS
1000.0000 mL | Freq: Once | INTRAVENOUS | Status: AC
  Administered 2019-03-29: 01:00:00 1000 mL via INTRAVENOUS

## 2019-03-29 MED ORDER — SODIUM CHLORIDE 0.9 % IV BOLUS
500.0000 mL | Freq: Once | INTRAVENOUS | Status: AC
  Administered 2019-03-29: 06:00:00 500 mL via INTRAVENOUS

## 2019-03-29 MED ORDER — INSULIN ASPART 100 UNIT/ML ~~LOC~~ SOLN
0.0000 [IU] | Freq: Three times a day (TID) | SUBCUTANEOUS | Status: DC
  Administered 2019-03-30 – 2019-03-31 (×5): 1 [IU] via SUBCUTANEOUS
  Administered 2019-03-31: 18:00:00 3 [IU] via SUBCUTANEOUS
  Administered 2019-04-01: 1 [IU] via SUBCUTANEOUS
  Administered 2019-04-01: 2 [IU] via SUBCUTANEOUS
  Administered 2019-04-02 (×2): 1 [IU] via SUBCUTANEOUS

## 2019-03-29 MED ORDER — CHLORHEXIDINE GLUCONATE CLOTH 2 % EX PADS
6.0000 | MEDICATED_PAD | Freq: Every day | CUTANEOUS | Status: DC
  Administered 2019-03-29 – 2019-04-02 (×2): 6 via TOPICAL

## 2019-03-29 MED ORDER — ORAL CARE MOUTH RINSE
15.0000 mL | Freq: Two times a day (BID) | OROMUCOSAL | Status: DC
  Administered 2019-03-29 – 2019-04-03 (×11): 15 mL via OROMUCOSAL

## 2019-03-29 MED ORDER — INSULIN ASPART 100 UNIT/ML ~~LOC~~ SOLN: 0.0000 [IU] | Freq: Every day | SUBCUTANEOUS | Status: DC

## 2019-03-29 MED ORDER — INSULIN ASPART 100 UNIT/ML IV SOLN
5.0000 [IU] | Freq: Once | INTRAVENOUS | Status: AC
  Administered 2019-03-29: 5 [IU] via INTRAVENOUS

## 2019-03-29 MED ORDER — ALBUTEROL SULFATE (2.5 MG/3ML) 0.083% IN NEBU: 2.5000 mg | INHALATION_SOLUTION | Freq: Four times a day (QID) | RESPIRATORY_TRACT | Status: DC | PRN

## 2019-03-29 MED ORDER — SODIUM CHLORIDE 0.9 % IV BOLUS
500.0000 mL | Freq: Once | INTRAVENOUS | Status: AC
  Administered 2019-03-29: 500 mL via INTRAVENOUS

## 2019-03-29 NOTE — Progress Notes (Signed)
PROGRESS NOTE    [name]  GMW:[id number] DOB: 04-25-1941 DOA: 03/28/2019 PCP: McDiarmid, Blane Ohara, MD   Brief Narrative:  Abigail Duncan is a 78 y.o. female with a known history of diabetes, peripheral neuropathy obstructive sleep apnea, spinal stenosis, osteoarthritis, chronic pain syndrome, atrial fibrillation on Coumadin presents to the emergency department for evaluation of weakness and altered mental status.  Patient was hospitalized for urinary tract infection and subsequently went to rehab.  She has been home for the past month however per report she has been having increasing difficulty with weakness, difficulty with ambulation and activities of daily living.  Per the son the patient has been having worsening confusion over the last week and hallucinations over the last few days..  On questioning patient complains only of weakness. Patient denies fevers/chills, weakness, dizziness, chest pain, shortness of breath, N/V/C/D, abdominal pain, dysuria/frequency, changes in mental status.   Admitting MD discussed with her at length her wishes regarding her medical treatment and advanced directives.  She stated that she would consider dialysis if she really needed it but would like to speak with the nephrologist prior to making decision.  She also stated that she would want to have CPR as well as intubation if needed.  EMS/ED Course: Patient received bicarb, calcium, insulin, dextrose, lactated Ringer's, Kayexalate. Medical admission has been requested for further management of acute kidney injury.    Assessment & Plan:   Active Problems:   Type II diabetes mellitus with neurological manifestations (HCC)   Pure hypercholesterolemia   Atrial fibrillation, permanent   Anemia of chronic disease   AKI (acute kidney injury) ([place])  Acute kidney injury with hyperkalemia. Hyperkalemia is improved with treatment.  Continue IV fluids.  Continue to avoid nephrotoxic agents as well as holding  lisinopril.  Renal ultrasound shows severe atrophy.  BUN and creatinine little improved, nephrology following.  Patient is progressing towards the need for HD.  Normocytic anemia: Patient is heme-negative B12 greater than 2000, hemoglobin 9.7 Continue to monitor, continue patient's home iron.   History of chronic UTI -Continue Keflex for now, urine culture in process.  History of atrial fibrillation - Continue metoprolol, Eliquis trolled.  History of diabetes - Continue Lantus, Accu-Cheks with regular insulin sliding scale coverage -No changes  History of neuropathy - Continue gabapentin  DVT prophylaxis: YQ:[id number] scd Code Status: full    Code Status Orders  (From admission, onward)         Start     Ordered   03/28/19 2304  Full code  Continuous     03/28/19 2303        Code Status History    Date Active Date Inactive Code Status Order ID Comments User Context   02/15/2019 1836 02/22/2019 0300 Partial Code [id number]  Kathrene Alu, MD ED   Advance Care Planning Activity     Family Communication: [phone] son, answered all questions Disposition Plan:   Patient will remain inpatient to receive continued antibiotics, subspecialty evaluation for possible hemodialysis, electrolyte management and fluid management.  Without these treatments patient at risk of life-threatening clinical deterioration. Consults called: nephrology Admission status: Inpatient   Consultants:   None  Procedures:  Ct Abdomen Pelvis Wo Contrast  Result Date: 03/28/2019 CLINICAL DATA:  Generalized weakness, generalized body pain, diverticulitis suspected EXAM: CT ABDOMEN AND PELVIS WITHOUT CONTRAST TECHNIQUE: Multidetector CT imaging of the abdomen and pelvis was performed following the standard protocol without IV contrast. COMPARISON:  None. FINDINGS: Lower chest: Cardiomegaly.  Extensive 3 vessel coronary artery calcifications. Hepatobiliary: No solid liver abnormality is seen.  Gallstones. No gallbladder wall thickening, or biliary dilatation. Pancreas: Unremarkable. No pancreatic ductal dilatation or surrounding inflammatory changes. Spleen: Normal in size without significant abnormality. Adrenals/Urinary Tract: Adrenal glands are unremarkable. Very atrophic right kidney. Unremarkable left kidney. No hydronephrosis. Bladder is unremarkable. Stomach/Bowel: Stomach is within normal limits. Appendix appears normal. No evidence of bowel wall thickening, distention, or inflammatory changes. Sigmoid diverticulosis. Vascular/Lymphatic: No significant vascular findings are present. No enlarged abdominal or pelvic lymph nodes. Reproductive: No mass or other significant abnormality. Other: No abdominal wall hernia or abnormality. No abdominopelvic ascites. Musculoskeletal: No acute or significant osseous findings. IMPRESSION: 1. No acute noncontrast CT findings of the abdomen or pelvis to explain abdominal pain. 2.  Sigmoid diverticulosis.  No evidence of acute diverticulitis. 3.  Gallstones.  No evidence of acute cholelithiasis. 4.  Other chronic and incidental findings as detailed above. Electronically Signed   By: Eddie Candle M.D.   On: 03/28/2019 19:08   Dg Abd 1 View  Result Date: 03/29/2019 CLINICAL DATA:  Tube placement EXAM: ABDOMEN - 1 VIEW COMPARISON:  CT dated March 28, 2019 FINDINGS: The enteric tube projects over the expected region of the gastric body. The bowel gas pattern is nonspecific and nonobstructive. IMPRESSION: Enteric tube tip projects over the expected region of the gastric body. Electronically Signed   By: Constance Holster M.D.   On: 03/29/2019 02:55   Ct Head Wo Contrast  Result Date: 03/28/2019 CLINICAL DATA:  Generalized weakness x2 months. EXAM: CT HEAD WITHOUT CONTRAST TECHNIQUE: Contiguous axial images were obtained from the base of the skull through the vertex without intravenous contrast. COMPARISON:  None. FINDINGS: Brain: No evidence of acute  infarction, hemorrhage, hydrocephalus, extra-axial collection or mass lesion/mass effect. Age related volume loss and chronic microvascular ischemic changes are noted. Vascular: No hyperdense vessel or unexpected calcification. Skull: Normal. Negative for fracture or focal lesion. Sinuses/Orbits: No acute finding. Other: None. IMPRESSION: 1. No acute intracranial abnormality. 2. Age related volume loss and chronic microvascular ischemic changes are noted. Electronically Signed   By: Constance Holster M.D.   On: 03/28/2019 18:54   US Renal  Result Date: 03/29/2019 CLINICAL DATA:  Acute kidney injury EXAM: RENAL / URINARY TRACT ULTRASOUND COMPLETE COMPARISON:  Abdominal CT from yesterday FINDINGS: Right Kidney: Severe atrophy with marked cortical thinning better seen by prior CT. No hydronephrosis. Renal length is 8.8 cm. Left Kidney: Renal measurements: 12 x 6 x 7 cm = volume: 270 mL. Simple cyst measuring 2.9 cm. No hydronephrosis or abnormal echogenicity. Bladder: Decompressed completely by a Foley catheter. IMPRESSION: 1. Severe right renal atrophy. 2. Unremarkable left kidney. Electronically Signed   By: Monte Fantasia M.D.   On: 03/29/2019 05:58   Dg Chest Portable 1 View  Result Date: 03/28/2019 CLINICAL DATA:  Generalized weakness. EXAM: PORTABLE CHEST 1 VIEW COMPARISON:  Radiograph of Feb 15, 2019. FINDINGS: Stable cardiomegaly. Atherosclerosis of thoracic aorta is noted. No pneumothorax is noted. Right lung is clear. Probable small left pleural effusion is noted with associated atelectasis. Bony thorax is unremarkable. IMPRESSION: Stable cardiomegaly. Probable small left pleural effusion is noted with associated atelectasis. Aortic Atherosclerosis (ICD10-I70.0). Electronically Signed   By: Marijo Conception M.D.   On: 03/28/2019 16:35     Antimicrobials:   keflex    Subjective: Patient was more awake answering questions appropriately.  Blood pressure is very variable getting poor readings.  She answered to name and  where she is and did not appear to be responding to hallucinations.  Objective: Vitals:   03/29/19 0816 03/29/19 0823 03/29/19 1000 03/29/19 1002  BP: (!) 131/116 (!) 168/117 (!) 73/47 (!) 94/58  Pulse:   82 96  Resp: 15 14 (!) 28 15  Temp:      TempSrc:      SpO2:   94% 98%  Weight:      Height:        Intake/Output Summary (Last 24 hours) at 03/29/2019 1122 Last data filed at 03/29/2019 1000 Gross per 24 hour  Intake 4566.18 ml  Output 450 ml  Net 4116.18 ml   Filed Weights   03/28/19 2301  Weight: 109.8 kg    Examination:  General exam: Appears calm and comfortable, and answer questions with prompting Respiratory system: Clear to auscultation. Respiratory effort normal. Cardiovascular system: S1 & S2 heard, RRR. No JVD, murmurs, rubs, gallops or clicks. No pedal edema. Gastrointestinal system: Abdomen is protuberant but nondistended, soft and nontender. No organomegaly or masses felt. Normal bowel sounds heard. Central nervous system: Alert and oriented to name and place. No focal neurological deficits. Extremities: Trace edema bilaterally, no contractures Skin: No rashes, lesions or ulcers Psychiatry: Does not appear to respond to hallucinations.  Mental status appears to be improving,    Data Reviewed: I have personally reviewed following labs and imaging studies  CBC: Recent Labs  Lab 03/28/19 1611 03/28/19 2315 03/29/19 0420  WBC 14.2* 10.1 9.7  NEUTROABS 12.0*  --   --   HGB 9.4* 8.1* 8.1*  HCT 31.2* 26.5* 25.6*  MCV 89.4 87.7 86.2  PLT 497* 405* 893   Basic Metabolic Panel: Recent Labs  Lab 03/28/19 1611 03/28/19 2315 03/29/19 0420 03/29/19 0657 03/29/19 1037  NA 130* 135 135 138 137  K >7.5* 6.9* 5.9* 4.9 4.9  CL 90* 94* 93* 96* 94*  CO2 11* 17* 22 22 23   GLUCOSE 146* 252* 262* 249* 180*  BUN 127* 109* 116* 115* PENDING  CREATININE 6.04* 5.32* 4.82* 4.73* 3.98*  CALCIUM 7.5* 7.1* 6.8* 6.5* 6.5*  MG  --  2.1  --    --   --   PHOS  --  8.6* 6.9*  --   --    GFR: Estimated Creatinine Clearance: 14.5 mL/min (A) (by C-G formula based on SCr of 3.98 mg/dL (H)). Liver Function Tests: Recent Labs  Lab 03/28/19 1611 03/28/19 2315 03/29/19 0420  AST 33  --  29  ALT 31  --  28  ALKPHOS 75  --  65  BILITOT 0.9  --  0.5  PROT 6.4*  --  5.1*  ALBUMIN 3.1* 2.7* 2.6*   No results for input(s): LIPASE, AMYLASE in the last 168 hours. No results for input(s): AMMONIA in the last 168 hours. Coagulation Profile: Recent Labs  Lab 03/28/19 2156  INR 2.4*   Cardiac Enzymes: No results for input(s): CKTOTAL, CKMB, CKMBINDEX, TROPONINI in the last 168 hours. BNP (last 3 results) No results for input(s): PROBNP in the last 8760 hours. HbA1C: No results for input(s): HGBA1C in the last 72 hours. CBG: Recent Labs  Lab 03/28/19 1601 03/28/19 2055 03/29/19 0109  GLUCAP 125* 153* 284*   Lipid Profile: No results for input(s): CHOL, HDL, LDLCALC, TRIG, CHOLHDL, LDLDIRECT in the last 72 hours. Thyroid Function Tests: No results for input(s): TSH, T4TOTAL, FREET4, T3FREE, THYROIDAB in the last 72 hours. Anemia Panel: Recent Labs    03/28/19 2315  VITAMINB12 2,337*  FERRITIN 43  TIBC 299  IRON 47   Sepsis Labs: Recent Labs  Lab 03/28/19 2315 03/29/19 0420 03/29/19 0657 03/29/19 1037  LATICACIDVEN 7.0* 5.6* 5.7* 4.0*    Recent Results (from the past 240 hour(s))  SARS Coronavirus 2 (CEPHEID - Performed in [place] hospital lab), Hosp Order     Status: None   Collection Time: 03/28/19  4:11 PM   Specimen: Nasopharyngeal Swab  Result Value Ref Range Status   SARS Coronavirus 2 NEGATIVE NEGATIVE Final    Comment: (NOTE) If result is NEGATIVE SARS-CoV-2 target nucleic acids are NOT DETECTED. The SARS-CoV-2 RNA is generally detectable in upper and lower  respiratory specimens during the acute phase of infection. The lowest  concentration of SARS-CoV-2 viral copies this assay can detect is  250  copies / mL. A negative result does not preclude SARS-CoV-2 infection  and should not be used as the sole basis for treatment or other  patient management decisions.  A negative result may occur with  improper specimen collection / handling, submission of specimen other  than nasopharyngeal swab, presence of viral mutation(s) within the  areas targeted by this assay, and inadequate number of viral copies  (<250 copies / mL). A negative result must be combined with clinical  observations, patient history, and epidemiological information. If result is POSITIVE SARS-CoV-2 target nucleic acids are DETECTED. The SARS-CoV-2 RNA is generally detectable in upper and lower  respiratory specimens dur ing the acute phase of infection.  Positive  results are indicative of active infection with SARS-CoV-2.  Clinical  correlation with patient history and other diagnostic information is  necessary to determine patient infection status.  Positive results do  not rule out bacterial infection or co-infection with other viruses. If result is PRESUMPTIVE POSTIVE SARS-CoV-2 nucleic acids MAY BE PRESENT.   A presumptive positive result was obtained on the submitted specimen  and confirmed on repeat testing.  While 2019 novel coronavirus  (SARS-CoV-2) nucleic acids may be present in the submitted sample  additional confirmatory testing may be necessary for epidemiological  and / or clinical management purposes  to differentiate between  SARS-CoV-2 and other Sarbecovirus currently known to infect humans.  If clinically indicated additional testing with an alternate test  methodology [phone]) is advised. The SARS-CoV-2 RNA is generally  detectable in upper and lower respiratory sp ecimens during the acute  phase of infection. The expected result is Negative. Fact Sheet for Patients:  StrictlyIdeas.no Fact Sheet for Healthcare Providers:  BankingDealers.co.za This test is not yet approved or cleared by the [place] FDA and has been authorized for detection and/or diagnosis of SARS-CoV-2 by FDA under an Emergency Use Authorization (EUA).  This EUA will remain in effect (meaning this test can be used) for the duration of the COVID-19 declaration under Section 564(b)(1) of the Act, 21 U.S.C. section 360bbb-3(b)(1), unless the authorization is terminated or revoked sooner. Performed at [facility], [place] [address], [place], [place] [address]   MRSA PCR Screening     Status: None   Collection Time: 03/28/19 11:05 PM   Specimen: Nasal Mucosa; Nasopharyngeal  Result Value Ref Range Status   MRSA by PCR NEGATIVE NEGATIVE Final    Comment:        The GeneXpert MRSA Assay (FDA approved for NASAL specimens only), is one component of a comprehensive MRSA colonization surveillance program. It is not intended to diagnose MRSA infection nor to guide or monitor treatment for MRSA infections. Performed at  [facility], [place] [address] [place], [place] [address]          Radiology Studies: Ct Abdomen Pelvis Wo Contrast  Result Date: 03/28/2019 CLINICAL DATA:  Generalized weakness, generalized body pain, diverticulitis suspected EXAM: CT ABDOMEN AND PELVIS WITHOUT CONTRAST TECHNIQUE: Multidetector CT imaging of the abdomen and pelvis was performed following the standard protocol without IV contrast. COMPARISON:  None. FINDINGS: Lower chest: Cardiomegaly. Extensive 3 vessel coronary artery calcifications. Hepatobiliary: No solid liver abnormality is seen. Gallstones. No gallbladder wall thickening, or biliary dilatation. Pancreas: Unremarkable. No pancreatic ductal dilatation or surrounding inflammatory changes. Spleen: Normal in size without significant abnormality. Adrenals/Urinary Tract: Adrenal glands are unremarkable. Very atrophic right kidney. Unremarkable left  kidney. No hydronephrosis. Bladder is unremarkable. Stomach/Bowel: Stomach is within normal limits. Appendix appears normal. No evidence of bowel wall thickening, distention, or inflammatory changes. Sigmoid diverticulosis. Vascular/Lymphatic: No significant vascular findings are present. No enlarged abdominal or pelvic lymph nodes. Reproductive: No mass or other significant abnormality. Other: No abdominal wall hernia or abnormality. No abdominopelvic ascites. Musculoskeletal: No acute or significant osseous findings. IMPRESSION: 1. No acute noncontrast CT findings of the abdomen or pelvis to explain abdominal pain. 2.  Sigmoid diverticulosis.  No evidence of acute diverticulitis. 3.  Gallstones.  No evidence of acute cholelithiasis. 4.  Other chronic and incidental findings as detailed above. Electronically Signed   By: Eddie Candle M.D.   On: 03/28/2019 19:08   Dg Abd 1 View  Result Date: 03/29/2019 CLINICAL DATA:  Tube placement EXAM: ABDOMEN - 1 VIEW COMPARISON:  CT dated March 28, 2019 FINDINGS: The enteric tube projects over the expected region of the gastric body. The bowel gas pattern is nonspecific and nonobstructive. IMPRESSION: Enteric tube tip projects over the expected region of the gastric body. Electronically Signed   By: Constance Holster M.D.   On: 03/29/2019 02:55   Ct Head Wo Contrast  Result Date: 03/28/2019 CLINICAL DATA:  Generalized weakness x2 months. EXAM: CT HEAD WITHOUT CONTRAST TECHNIQUE: Contiguous axial images were obtained from the base of the skull through the vertex without intravenous contrast. COMPARISON:  None. FINDINGS: Brain: No evidence of acute infarction, hemorrhage, hydrocephalus, extra-axial collection or mass lesion/mass effect. Age related volume loss and chronic microvascular ischemic changes are noted. Vascular: No hyperdense vessel or unexpected calcification. Skull: Normal. Negative for fracture or focal lesion. Sinuses/Orbits: No acute finding. Other: None.  IMPRESSION: 1. No acute intracranial abnormality. 2. Age related volume loss and chronic microvascular ischemic changes are noted. Electronically Signed   By: Constance Holster M.D.   On: 03/28/2019 18:54   US Renal  Result Date: 03/29/2019 CLINICAL DATA:  Acute kidney injury EXAM: RENAL / URINARY TRACT ULTRASOUND COMPLETE COMPARISON:  Abdominal CT from yesterday FINDINGS: Right Kidney: Severe atrophy with marked cortical thinning better seen by prior CT. No hydronephrosis. Renal length is 8.8 cm. Left Kidney: Renal measurements: 12 x 6 x 7 cm = volume: 270 mL. Simple cyst measuring 2.9 cm. No hydronephrosis or abnormal echogenicity. Bladder: Decompressed completely by a Foley catheter. IMPRESSION: 1. Severe right renal atrophy. 2. Unremarkable left kidney. Electronically Signed   By: Monte Fantasia M.D.   On: 03/29/2019 05:58   Dg Chest Portable 1 View  Result Date: 03/28/2019 CLINICAL DATA:  Generalized weakness. EXAM: PORTABLE CHEST 1 VIEW COMPARISON:  Radiograph of Feb 15, 2019. FINDINGS: Stable cardiomegaly. Atherosclerosis of thoracic aorta is noted. No pneumothorax is noted. Right lung is clear. Probable small left pleural effusion is  noted with associated atelectasis. Bony thorax is unremarkable. IMPRESSION: Stable cardiomegaly. Probable small left pleural effusion is noted with associated atelectasis. Aortic Atherosclerosis (ICD10-I70.0). Electronically Signed   By: Marijo Conception M.D.   On: 03/28/2019 16:35        Scheduled Meds: . apixaban  5 mg Oral BID  . cephALEXin  500 mg Oral Daily  . Chlorhexidine Gluconate Cloth  6 each Topical Q0600  . ferrous sulfate  325 mg Oral Daily  . gabapentin  300 mg Oral TID  . insulin glargine  10 Units Subcutaneous Daily  . mouth rinse  15 mL Mouth Rinse BID  . metoprolol tartrate  100 mg Oral BID  . predniSONE  15 mg Oral Q breakfast  . sodium polystyrene  45 g Oral Once  . vitamin B-12  1,000 mcg Oral Daily   Continuous Infusions: .   sodium bicarbonate  infusion 1000 mL 150 mL/hr at 03/29/19 1028     LOS: 1 day    Time spent: 34 min    Nicolette Bang, MD [facility]  If 7PM-7AM, please contact night-coverage  03/29/2019, 11:22 AM

## 2019-03-29 NOTE — Patient Outreach (Signed)
[facility]) Care Management [place] Telephone [facility] Coordination  03/29/2019  [name] 24-Oct-1940 [id number]  [place] Coordination outreach in coverage of primary [place] RN CM re:  Abigail Duncan, 78 y/o female with multiple recent hospitalizations; Patient has history including, but not limited to, HTN; OSA; AF; DM; pulmonary HTN, and CKD.  Noted from review of EMR that patient presented to ED yesterday, 03/28/2019 with weakness, altered mental status/ hallucinations, hyperkalemia, secondary to acute renal failure/ AKI.  Patient is currently admitted at [facility] with discharge status pending.  Secure communication via EMR sent to [place] [facility] [place],  [place] CSW Eduard Clos, and primary [place] [address], notifying of patient's hospital readmission yesterday.  Plan:  [facility] RN CM will follow patient's progress while hospitalized and collaborate with [facility] liaison's for discharge disposition.  Oneta Rack, RN, BSN, [employer] [facility] Care Management  [phone]

## 2019-03-29 NOTE — Progress Notes (Signed)
CRITICAL VALUE ALERT  Critical Value:  K 6.9  Date & Time Notied:  6/24 @ 0001  Provider Notified: Hospitalist on call    Orders Received/Actions taken: see new orders

## 2019-03-29 NOTE — Progress Notes (Signed)
CRITICAL VALUE ALERT  Critical Value:  Lactic Acid 5.6  Date & Time Notied:  06/24  0446  Provider Notified: Hospitalist on call  Orders Received/Actions taken: See new orders

## 2019-03-29 NOTE — Telephone Encounter (Signed)
Please call in order to [facility] for:  1. OT 2x/wk x1wk, then 1xwk x 4wks 2. HH Aide bathing 2xwkx4wks 3. Social Worker evaluation  4. Please fax order for patient lift to [facility] at [phone]

## 2019-03-29 NOTE — Progress Notes (Signed)
CBG recheck after insulin and [id number]

## 2019-03-29 NOTE — Progress Notes (Signed)
CRITICAL VALUE ALERT  Critical Value:  Lactic Acid 7.0  Date & Time Notied:  6/24 @ 0001   Provider Notified: Hospitalist on call   Orders Received/Actions taken: see new orders

## 2019-03-29 NOTE — Progress Notes (Signed)
CRITICAL VALUE ALERT  Critical Value:  Lactic acid 5.7  Date & Time Notied:  03/29/2019 8:05 AM  Provider Notified: Wyonia Hough, MD  Orders Received/Actions taken: Continue monitoring, lactic acid trending steadily.

## 2019-03-29 NOTE — Telephone Encounter (Signed)
Verbal orders given to [facility] and I let her know that [facility] is processing her hoyer lift order.  She voiced understanding.  Jalacia Mattila,CMA

## 2019-03-29 NOTE — Progress Notes (Signed)
CRITICAL VALUE ALERT  Critical Value:  Lactic acid 4.0  Date & Time Notied:  03/29/2019 11:10 AM  Provider Notified: [place]  Orders Received/Actions taken: Continue monitoring

## 2019-03-29 NOTE — Consult Note (Signed)
   [facility] Inpatient Consult   03/29/2019  Abigail Duncan 1940-12-07 [id number]  Patient is currently active with [facility] for chronic disease management services.  Patient has been engaged by a [facility] and Education officer, museum. Will follow for progression and disposition plans.  Of note, [facility] Care Management services does not replace or interfere with any services that are needed or arranged by inpatient case management or social work.    Netta Cedars, MSN, [facility] Liaison Nurse Mobile Phone [phone]  Toll free office [phone]

## 2019-03-29 NOTE — Evaluation (Signed)
Physical Therapy Evaluation Patient Details Name: Abigail Duncan MRN: [id number] DOB: 12-19-1940 Today's Date: 03/29/2019   History of Present Illness  71  H/O PMR, spinal stenosis, DM , peripheral neuropathy  brought in by EMS for confusion/hallucinations.Abigail Duncan has  been taking care of pt.  for the last month since DC from rehab.per son, has  been unable to walk since being out of rehab due to weakness in both legs.  Clinical Impression  The patient is lethargic, arouses and yells out when moved around. Patient noted to move UE's, legs are quite stiff and heels reddened. Patient  Was in rehab recently, then Dc to care of son. Uncertain of functional staus other than chart reports not able to ambulate after Dc from rehab. Pt admitted with above diagnosis. Pt currently with functional limitations due to the deficits listed below (see PT Problem List).  Pt will benefit from skilled PT to increase their independence and safety with mobility to allow discharge to the venue listed below.       Follow Up Recommendations SNF    Equipment Recommendations  None recommended by PT    Recommendations for Other Services   OT    Precautions / Restrictions Precautions Precautions: Fall Precaution Comments: NG, flexiseal      Mobility  Bed Mobility Overal bed mobility: Needs Assistance Bed Mobility: Rolling Rolling: Total assist;+2 for physical assistance;+2 for safety/equipment         General bed mobility comments: patient does reach out to rail, Placed bed in chair position, patient unable to sit upright , did not  use rails to pull forward. returned to sidelying. heels floated  Transfers                 General transfer comment: NT  Ambulation/Gait                Stairs            Wheelchair Mobility    Modified Rankin (Stroke Patients Only)       Balance                                             Pertinent Vitals/Pain Pain  Assessment: Faces Faces Pain Scale: Hurts whole lot Pain Location: generalized Pain Descriptors / Indicators: Discomfort;Grimacing;Moaning Pain Intervention(s): Monitored during session    Home Living Family/patient expects to be discharged to:: Private residence Living Arrangements: Children Available Help at Discharge: Family;Available PRN/intermittently Type of Home: Mobile home Home Access: Level entry;Ramped entrance     Home Layout: One level Home Equipment: Wheelchair - [employer] - 4 wheels;Cane - single point;Cane - quad;Shower seat;Grab bars - tub/shower;Hand held shower head;Bedside commode Additional Comments: info from previous encounter    Prior Function Level of Independence: Needs assistance   Gait / Transfers Assistance Needed: per epic report, unable to ambu;late since DC from rehab? date           Hand Dominance        Extremity/Trunk Assessment   Upper Extremity Assessment Upper Extremity Assessment: Defer to OT evaluation    Lower Extremity Assessment Lower Extremity Assessment: RLE deficits/detail;LLE deficits/detail RLE Deficits / Details: barely moves the legs, noted heels red, moves  feet LLE Deficits / Details: same as right       Communication      Cognition Arousal/Alertness: Lethargic Behavior During Therapy: Restless Overall  Cognitive Status: No family/caregiver present to determine baseline cognitive functioning Area of Impairment: Orientation;Following commands;Awareness                 Orientation Level: Time;Situation;Place         Awareness: Intellectual          General Comments General comments (skin integrity, edema, etc.): unable to sit upright    Exercises     Assessment/Plan    PT Assessment Patient needs continued PT services  PT Problem List Decreased strength;Decreased mobility;Decreased safety awareness;Decreased range of motion;Decreased knowledge of precautions;Decreased activity  tolerance;Decreased cognition;Cardiopulmonary status limiting activity;Decreased balance;Decreased knowledge of use of DME       PT Treatment Interventions Functional mobility training;Therapeutic activities;Therapeutic exercise;Patient/family education    PT Goals (Current goals can be found in the Care Plan section)  Acute Rehab PT Goals Patient Stated Goal: none Time For Goal Achievement: 04/12/19 Potential to Achieve Goals: Fair    Frequency Min 2X/week   Barriers to discharge Decreased caregiver support      Co-evaluation               AM-PAC PT "6 Clicks" Mobility  Outcome Measure Help needed turning from your back to your side while in a flat bed without using bedrails?: Total Help needed moving from lying on your back to sitting on the side of a flat bed without using bedrails?: Total Help needed moving to and from a bed to a chair (including a wheelchair)?: Total Help needed standing up from a chair using your arms (e.g., wheelchair or bedside chair)?: Total Help needed to walk in hospital room?: Total Help needed climbing 3-5 steps with a railing? : Total 6 Click Score: 6    End of Session   Activity Tolerance: Patient limited by lethargy Patient left: in bed;with call bell/phone within reach;with bed alarm set Nurse Communication: Mobility status;Need for lift equipment PT Visit Diagnosis: Difficulty in walking, not elsewhere classified (R26.2)    Time: 1015-1040 PT Time Calculation (min) (ACUTE ONLY): 25 min   Charges:   PT Evaluation $PT Eval Low Complexity: 1 Low PT Treatments $Therapeutic Exercise: 8-22 mins          Tresa Endo PT Acute Rehabilitation Services Pager [phone] Office [phone]   Claretha Cooper 03/29/2019, 10:53 AM

## 2019-03-29 NOTE — Progress Notes (Signed)
Inpatient Diabetes Program Recommendations  AACE/ADA: New Consensus Statement on Inpatient Glycemic Control (2015)  Target Ranges:  Prepandial:   less than 140 mg/dL      Peak postprandial:   less than 180 mg/dL (1-2 hours)      Critically ill patients:  140 - 180 mg/dL   Lab Results  Component Value Date   GLUCAP 284 (H) 03/29/2019   HGBA1C 7.8 (A) 09/08/2018    Review of Glycemic Control  Diabetes history: DM2 Outpatient Diabetes medications: Lantus 10 units QD, metformin 1000 mg QAM and 500 mg QPM Current orders for Inpatient glycemic control: Lantus 10 units QD  Needs correction insulin. Per Nephrology - needs DM control  Inpatient Diabetes Program Recommendations:     Add Novolog 0-9 units tidwc and hs  Will continue to follow.  Thank you. Lorenda Peck, RD, LDN, CDE Inpatient Diabetes Coordinator [phone]

## 2019-03-29 NOTE — Progress Notes (Signed)
[facility] Progress Note  Subjective: still a bit confused. Creat down and BUN down, making urine 450 cc  Vitals:   03/29/19 0823 03/29/19 1000 03/29/19 1002 03/29/19 1200  BP: (!) 168/117 (!) 73/47 (!) 94/58   Pulse:  82 96   Resp: 14 (!) 28 15   Temp:    97.7 F (36.5 C)  TempSrc:    Oral  SpO2:  94% 98%   Weight:      Height:        Inpatient medications: . apixaban  5 mg Oral BID  . cephALEXin  500 mg Oral Daily  . Chlorhexidine Gluconate Cloth  6 each Topical Q0600  . ferrous sulfate  325 mg Oral Daily  . gabapentin  300 mg Oral TID  . insulin glargine  10 Units Subcutaneous Daily  . mouth rinse  15 mL Mouth Rinse BID  . metoprolol tartrate  100 mg Oral BID  . predniSONE  15 mg Oral Q breakfast  . sodium polystyrene  45 g Oral Once  . vitamin B-12  1,000 mcg Oral Daily   .  sodium bicarbonate  infusion 1000 mL 150 mL/hr at 03/29/19 1028   acetaminophen **OR** acetaminophen, albuterol, bisacodyl, fluticasone, ipratropium, magnesium citrate, nitroGLYCERIN, ondansetron **OR** ondansetron (ZOFRAN) IV, oxyCODONE, senna-docusate    Exam: Physical Exam Physical Examination: General appearance - pale, morbid obesity Mental status - Ox1, confused, but tracking some Chest - rales noted bibasilar, decreased air entry noted bilat Heart - irregularly irregular rhythm with rate 62G, systolic murmur BT5/1 at 2nd left intercostal space Abdomen - very obese, pos bs, soft Extremities - pedal edema 1 +, decreased DP, bluish hue to feet Skin - bruises, trophic changes distally  Assessment: 1 AKI  Vol depletion and ACEi.  Cont IVF"s. Need to tx conservatively with her wishes. Not candidate for long term RRT. Will follow.  2 CKD4 with ischemic nephropathy 3 Hypertension: not an issue. Avoid ACEI 4. Anemia will hold off eval at this time 5. Metabolic Bone Disease: on meds 6 DM needs control 7 massive obesity 8 Confusion prob metabolic 9 Chronic pain 10 severe  debill. P as above     [facility] 03/29/2019, 1:23 PM  Iron/TIBC/Ferritin/ %Sat    Component Value Date/Time   IRON 47 03/28/2019 2315   IRON 28 10/27/2018 1118   TIBC 299 03/28/2019 2315   TIBC 371 10/27/2018 1118   FERRITIN 43 03/28/2019 2315   FERRITIN 15 10/27/2018 1118   IRONPCTSAT 16 03/28/2019 2315   IRONPCTSAT 8 (LL) 10/27/2018 1118   Recent Labs  Lab 03/28/19 2156  03/29/19 0420  03/29/19 1037  NA  --    < > 135   < > 137  K  --    < > 5.9*   < > 4.9  CL  --    < > 93*   < > 94*  CO2  --    < > 22   < > 23  GLUCOSE  --    < > 262*   < > 180*  BUN  --    < > 116*   < > 109*  CREATININE  --    < > 4.82*   < > 3.98*  CALCIUM  --    < > 6.8*   < > 6.5*  PHOS  --    < > 6.9*  --   --   ALBUMIN  --    < >  2.6*  --   --   INR 2.4*  --   --   --   --    < > = values in this interval not displayed.   Recent Labs  Lab 03/29/19 0420  AST 29  ALT 28  ALKPHOS 65  BILITOT 0.5  PROT 5.1*   Recent Labs  Lab 03/29/19 0420  WBC 9.7  HGB 8.1*  HCT 25.6*  PLT 358

## 2019-03-30 ENCOUNTER — Other Ambulatory Visit: Payer: Self-pay

## 2019-03-30 DIAGNOSIS — L8990 Pressure ulcer of unspecified site, unspecified stage: Secondary | ICD-10-CM | POA: Insufficient documentation

## 2019-03-30 DIAGNOSIS — G928 Other toxic encephalopathy: Secondary | ICD-10-CM | POA: Diagnosis present

## 2019-03-30 DIAGNOSIS — Z515 Encounter for palliative care: Secondary | ICD-10-CM

## 2019-03-30 DIAGNOSIS — G92 Toxic encephalopathy: Secondary | ICD-10-CM

## 2019-03-30 DIAGNOSIS — R579 Shock, unspecified: Secondary | ICD-10-CM

## 2019-03-30 DIAGNOSIS — I4821 Permanent atrial fibrillation: Secondary | ICD-10-CM

## 2019-03-30 DIAGNOSIS — Z7189 Other specified counseling: Secondary | ICD-10-CM

## 2019-03-30 DIAGNOSIS — D638 Anemia in other chronic diseases classified elsewhere: Secondary | ICD-10-CM

## 2019-03-30 DIAGNOSIS — N179 Acute kidney failure, unspecified: Secondary | ICD-10-CM

## 2019-03-30 LAB — CBC
HCT: 23.6 % — ABNORMAL LOW (ref 36.0–46.0)
Hemoglobin: 7.4 g/dL — ABNORMAL LOW (ref 12.0–15.0)
MCH: 27.4 pg (ref 26.0–34.0)
MCHC: 31.4 g/dL (ref 30.0–36.0)
MCV: 87.4 fL (ref 80.0–100.0)
Platelets: 272 10*3/uL (ref 150–400)
RBC: 2.7 MIL/uL — ABNORMAL LOW (ref 3.87–5.11)
RDW: 18.6 % — ABNORMAL HIGH (ref 11.5–15.5)
WBC: 9.2 10*3/uL (ref 4.0–10.5)
nRBC: 0 % (ref 0.0–0.2)

## 2019-03-30 LAB — FOLATE RBC
Folate, Hemolysate: 415 ng/mL
Folate, RBC: 1477 ng/mL (ref >498)
Hematocrit: 28.1 % — ABNORMAL LOW (ref 34.0–46.6)

## 2019-03-30 LAB — BASIC METABOLIC PANEL
Anion gap: 17 — ABNORMAL HIGH (ref 5–15)
BUN: 92 mg/dL — ABNORMAL HIGH (ref 8–23)
CO2: 32 mmol/L (ref 22–32)
Calcium: 6 mg/dL — CL (ref 8.9–10.3)
Chloride: 89 mmol/L — ABNORMAL LOW (ref 98–111)
Creatinine, Ser: 3.99 mg/dL — ABNORMAL HIGH (ref 0.44–1.00)
GFR calc Af Amer: 12 mL/min — ABNORMAL LOW (ref >60)
GFR calc non Af Amer: 10 mL/min — ABNORMAL LOW (ref >60)
Glucose, Bld: 161 mg/dL — ABNORMAL HIGH (ref 70–99)
Potassium: 3.7 mmol/L (ref 3.5–5.1)
Sodium: 138 mmol/L (ref 135–145)

## 2019-03-30 LAB — CBC WITH DIFFERENTIAL/PLATELET
Abs Immature Granulocytes: 0.09 10*3/uL — ABNORMAL HIGH (ref 0.00–0.07)
Basophils Absolute: 0 10*3/uL (ref 0.0–0.1)
Basophils Relative: 0 %
Eosinophils Absolute: 0 10*3/uL (ref 0.0–0.5)
Eosinophils Relative: 0 %
HCT: 22.2 % — ABNORMAL LOW (ref 36.0–46.0)
Hemoglobin: 7 g/dL — ABNORMAL LOW (ref 12.0–15.0)
Immature Granulocytes: 1 %
Lymphocytes Relative: 14 %
Lymphs Abs: 1.4 10*3/uL (ref 0.7–4.0)
MCH: 27 pg (ref 26.0–34.0)
MCHC: 31.5 g/dL (ref 30.0–36.0)
MCV: 85.7 fL (ref 80.0–100.0)
Monocytes Absolute: 1 10*3/uL (ref 0.1–1.0)
Monocytes Relative: 10 %
Neutro Abs: 7.3 10*3/uL (ref 1.7–7.7)
Neutrophils Relative %: 75 %
Platelets: 302 10*3/uL (ref 150–400)
RBC: 2.59 MIL/uL — ABNORMAL LOW (ref 3.87–5.11)
RDW: 18.8 % — ABNORMAL HIGH (ref 11.5–15.5)
WBC: 9.8 10*3/uL (ref 4.0–10.5)
nRBC: 0 % (ref 0.0–0.2)

## 2019-03-30 LAB — URINE CULTURE: Culture: 80000 — AB

## 2019-03-30 LAB — GLUCOSE, CAPILLARY
Glucose-Capillary: 127 mg/dL — ABNORMAL HIGH (ref 70–99)
Glucose-Capillary: 128 mg/dL — ABNORMAL HIGH (ref 70–99)
Glucose-Capillary: 147 mg/dL — ABNORMAL HIGH (ref 70–99)
Glucose-Capillary: 148 mg/dL — ABNORMAL HIGH (ref 70–99)

## 2019-03-30 LAB — LACTIC ACID, PLASMA: Lactic Acid, Venous: 2 mmol/L (ref 0.5–1.9)

## 2019-03-30 LAB — CALCIUM, IONIZED: Calcium, Ionized, Serum: 3.8 mg/dL — ABNORMAL LOW (ref 4.5–5.6)

## 2019-03-30 MED ORDER — SODIUM CHLORIDE 0.9 % IV BOLUS
500.0000 mL | Freq: Once | INTRAVENOUS | Status: DC
  Administered 2019-03-30: 01:00:00 500 mL via INTRAVENOUS

## 2019-03-30 MED ORDER — METOPROLOL TARTRATE 25 MG PO TABS: 50.0000 mg | ORAL_TABLET | Freq: Two times a day (BID) | ORAL | Status: DC

## 2019-03-30 MED ORDER — NOREPINEPHRINE 4 MG/250ML-% IV SOLN
2.0000 ug/min | INTRAVENOUS | Status: DC
  Administered 2019-03-30 (×3): 2 ug/min via INTRAVENOUS
  Administered 2019-03-31: 5 ug/min via INTRAVENOUS
  Administered 2019-03-31: 15:00:00 5.013 ug/min via INTRAVENOUS
  Filled 2019-03-30 (×4): qty 250

## 2019-03-30 MED ORDER — CALCIUM GLUCONATE-NACL 1-0.675 GM/50ML-% IV SOLN
1.0000 g | INTRAVENOUS | Status: AC
  Administered 2019-03-30 (×3): 1000 mg via INTRAVENOUS
  Filled 2019-03-30 (×3): qty 50

## 2019-03-30 MED ORDER — SODIUM CHLORIDE 0.9 % IV SOLN: INTRAVENOUS | Status: DC | PRN

## 2019-03-30 MED ORDER — METOPROLOL TARTRATE 25 MG PO TABS: 75.0000 mg | ORAL_TABLET | Freq: Two times a day (BID) | ORAL | Status: DC

## 2019-03-30 MED ORDER — SODIUM CHLORIDE 0.9 % IV SOLN: 250.0000 mL | INTRAVENOUS | Status: DC

## 2019-03-30 NOTE — Progress Notes (Signed)
Pt fell asleep again.  BP 59/30 on right wrist, 59/37 on left wrist.  Pt arouses to pain, falls back asleep again.  RT to room to attempt Radial A line.

## 2019-03-30 NOTE — Progress Notes (Signed)
RN was informed by critical care to allow the levo to stay on with goal of systolic 90 and above and to wean as tolerated. Critical care then signed off and triad will continue to monitor pt.

## 2019-03-30 NOTE — Progress Notes (Signed)
Pt very restless, pulling safety mitten off, monitor, gown.  Pt oriented to name, year, thinks she is at [facility] and Trump in president.  Follows simple commands.  C/o discomfort all over.   Pt repositioned, oxycodone 5mg  given.

## 2019-03-30 NOTE — Progress Notes (Signed)
[facility] Progress Note  Subjective: still confused  Vitals:   03/30/19 0645 03/30/19 0700 03/30/19 0800 03/30/19 0900  BP: (!) 79/42 (!) 97/50 105/81 112/78  Pulse: 70 62 76 93  Resp: 13 13 14  (!) 22  Temp:      TempSrc:      SpO2: 100% 100% 100% 99%  Weight:      Height:        Inpatient medications: . cephALEXin  500 mg Oral Daily  . Chlorhexidine Gluconate Cloth  6 each Topical Q0600  . ferrous sulfate  325 mg Oral Daily  . insulin aspart  0-5 Units Subcutaneous QHS  . insulin aspart  0-9 Units Subcutaneous TID WC  . insulin glargine  10 Units Subcutaneous Daily  . mouth rinse  15 mL Mouth Rinse BID  . predniSONE  15 mg Oral Q breakfast  . vitamin B-12  1,000 mcg Oral Daily   . sodium chloride    . sodium chloride    . calcium gluconate 1,000 mg (03/30/19 0902)  . norepinephrine (LEVOPHED) Adult infusion 5 mcg/min (03/30/19 0902)   Place/Maintain arterial line **AND** sodium chloride, acetaminophen **OR** acetaminophen, albuterol, bisacodyl, fluticasone, magnesium citrate, nitroGLYCERIN, ondansetron **OR** ondansetron (ZOFRAN) IV, oxyCODONE, senna-docusate    Exam: Physical Exam Physical Examination: General appearance - pale, morbid obesity Mental status - Ox1, confused, but tracking some Chest - rales noted bibasilar, decreased air entry noted bilat Heart - irregularly irregular rhythm with rate 88F, systolic murmur OY7/7 at 2nd left intercostal space Abdomen - very obese, pos bs, soft Extremities - pedal edema 1 +, decreased DP, bluish hue to feet Skin - bruises, trophic changes distally  Assessment: 1 AKI  Vol depletion and ACEi. Creat improved 6 >  3.9 w/ IVF's.  Excellent UOP. Baseline creat 1.5- 2.0. Patient is a poor dialysis candidate, do not recommend dialysis/ RRT.  Would encourage conservative management w/ family and get pall care to see patient as uremia is likely affecting her clinical state at this time.  Will sign off.   2 CKD4  with ischemic nephropathy 3 Hypertension: not an issue. Avoid ACEI 4. Anemia will hold off eval at this time 5. Metabolic Bone Disease: on meds 6 DM needs control 7 massive obesity 8 Confusion prob metabolic 9 Chronic pain 10 severe debill. P as above     [facility] 03/30/2019, 9:44 AM  Iron/TIBC/Ferritin/ %Sat    Component Value Date/Time   IRON 47 03/28/2019 2315   IRON 28 10/27/2018 1118   TIBC 299 03/28/2019 2315   TIBC 371 10/27/2018 1118   FERRITIN 43 03/28/2019 2315   FERRITIN 15 10/27/2018 1118   IRONPCTSAT 16 03/28/2019 2315   IRONPCTSAT 8 (LL) 10/27/2018 1118   Recent Labs  Lab 03/28/19 2156  03/29/19 0420  03/30/19 0231  NA  --    < > 135   < > 138  K  --    < > 5.9*   < > 3.7  CL  --    < > 93*   < > 89*  CO2  --    < > 22   < > 32  GLUCOSE  --    < > 262*   < > 161*  BUN  --    < > 116*   < > 92*  CREATININE  --    < > 4.82*   < > 3.99*  CALCIUM  --    < > 6.8*   < >  6.0*  PHOS  --    < > 6.9*  --   --   ALBUMIN  --    < > 2.6*  --   --   INR 2.4*  --   --   --   --    < > = values in this interval not displayed.   Recent Labs  Lab 03/29/19 0420  AST 29  ALT 28  ALKPHOS 65  BILITOT 0.5  PROT 5.1*   Recent Labs  Lab 03/30/19 0231  WBC 9.8  HGB 7.0*  HCT 22.2*  PLT 302

## 2019-03-30 NOTE — Progress Notes (Addendum)
Pt became lethargic, hypotensive when patient fell asleep.  Pt was woken up by nurse.  Pt still lethagic.   500cc NS fluid bolus per charge nurse.   NP [place] notified.

## 2019-03-30 NOTE — Consult Note (Signed)
PULMONARY / CRITICAL CARE MEDICINE   NAME:  Abigail Duncan, MRN:  [id number], DOB:  1940/10/28, LOS: 2 ADMISSION DATE:  03/28/2019, CONSULTATION DATE:  03/29/2019 REFERRING MD:  Triad, CHIEF COMPLAINT:  Ams/low bp  BRIEF HISTORY:    9yowf never smoker with MO complicated by dm/diabetic neuropathy/osa/djd/hjbp/caf admitted 03/28/19 with ams and found to have acute on chronic renal failure on ACEi with last dose ?  03/28/19 and erratic bp measurements due to technical difficulty monitoring it and unable to get arterial access so PCCM service consulted am 6/24.  HISTORY OF PRESENT ILLNESS   Not able to obtain any independent hx but per triad:   Patient was hospitalized for urinary tract infection and subsequently went to rehab.  She has been home for the past month however per report she has been having increasing difficulty with weakness, difficulty with ambulation and activities of daily living.  Per the son the patient has been having worsening confusion over the last week and hallucinations x  sev days PTA   SIGNIFICANT PAST MEDICAL HISTORY    Past Medical History:  Diagnosis Date  . Anticoagulation goal of INR 2 to 3 07/15/2018   Atrial fibrillation, permanent  . Bilateral primary osteoarthritis of knee 03/23/2019  . Chronic pain syndrome 07/15/2018  . Degenerative arthritis of knee 04/2001   by xray  . Diabetes mellitus with proteinuria ([place]) 03/23/2019  . Diabetic peripheral neuropathy ([place]) 10/23/2013  . Diabetic peripheral neuropathy ([place]) 10/23/2013  . Elevated sed rate   . Impaired mobility 05/21/2017  . Kidney stone on right side 05/12/2016   [place] 05/2016 - non-obstructing right 6 mm stone  . Leg swelling 06/14/2015  . Lower urinary tract infectious disease   . Obstructive sleep apnea 05/19/2018   Sleep study showed some apnea and nighttime desaturations. However, did not meet criteria for CPAP.  Marland Kitchen Proud flesh 09/09/2018  . Right knee pain 07/23/2014  . Sciatica of left side 12/2003    DDD L5,S1   . Secondary hyperparathyroidism ([place]) 05/31/2017  . Shortness of breath 10/16/2016  . Spinal stenosis, lumbar 04/2004   mild on MRI at L 4-5      SIGNIFICANT EVENTS:    STUDIES:    Echo 02/16/2019:  1. The left ventricle has normal systolic function with an ejection fraction of 60-65%. The cavity size was normal. There is mild concentric left ventricular hypertrophy. Left ventricular diastolic function could not be evaluated secondary to atrial  fibrillation.  2. The right ventricle has moderately reduced systolic function. The cavity was moderately enlarged. There is no increase in right ventricular wall thickness.  3. Left atrial size was mildly dilated. Renal u/s 03/28/19:  1. Severe right renal atrophy. 2. Unremarkable left kidney.  CULTURES/micro data:  CCU  03/28/2019  80 k E coli  Covid 19 Pcr  6/23  Neg    ANTIBIOTICS:  Cephalexin 6/24 >>  LINES/TUBES:     CONSULTANTS:  Renal 6/23 PCCM 6/24   SUBJECTIVE:  Arouses to nox stimuli and starts yelling but nothing intelligble   CONSTITUTIONAL: BP (!) 97/50   Pulse 62   Temp 97.9 F (36.6 C) (Oral)   Resp 13   Ht '5\' 3"'  (1.6 m)   Wt 118 kg   LMP  (LMP Unknown)   SpO2 100%   BMI 46.08 kg/m  On 2lpm   Intake/Output Summary (Last 24 hours) at 03/30/2019 0839 Last data filed at 03/30/2019 0500 Gross per 24 hour  Intake 4104.05 ml  Output 1550 ml  Net 2554.05 ml          PHYSICAL EXAM: General:  Obese wf nad unless stimulated then starts yelling Neuro:  No focal deficits HEENT:  orophx clear Cardiovascular:  IRREG / pulse 73 Lungs: distant bs, no wheeze Abdomen:  Obese/ soft, no apparent tenderness Musculoskeletal:  No deformities Skin:  Warm and dry     I personally reviewed images and agree with radiology impression as follows:  pCXR:   03/28/19 Stable cardiomegaly. Probable small left pleural effusion is noted with associated atelectasis.   Lab Results  Component Value Date    CREATININE 3.99 (H) 03/30/2019   CREATININE 3.98 (H) 03/29/2019   CREATININE 4.73 (H) 03/29/2019   CREATININE 1.85 (H) 06/18/2016   CREATININE 1.68 (H) 04/27/2016   CREATININE 1.50 (H) 03/20/2016     RESOLVED PROBLEM LIST   ASSESSMENT AND PLAN    1)  ? Circulatory shock vs unable to obtain accurate BPand can't obtain arterial access  - Rec:  Check doppler systolics on R arm and automatic on L and use the higher number to titrate levophed off and if not able to then can do cvp - has been on prednisone>>  Continue  - Agree no more ACEi in this setting and probably not a good choice chronically either   2) Anemia ? Acute blood loss on eliquis > d/c  For now - repeat cbc in 4 hours and consider transfusion esp if still on levophed   3) AMS  ? With uti/acute renal failure  C/w  TME  - needs drug washout and regroup   4) acute resp failure ? With hypercarbic component  - likely related to obesity/ basilar atx  - keep sats in lower 90s to avoid blunting hypoxic drive   5) ? UTI vs colonized >  on abx per triad/ strongly doubt sepsis        LABS  Glucose Recent Labs  Lab 03/28/19 1601 03/28/19 2055 03/29/19 0109 03/29/19 2238 03/30/19 0800  GLUCAP 125* 153* 284* 211* 127*    BMET Recent Labs  Lab 03/29/19 0657 03/29/19 1037 03/30/19 0231  NA 138 137 138  K 4.9 4.9 3.7  CL 96* 94* 89*  CO2 22 23 32  BUN 115* 109* 92*  CREATININE 4.73* 3.98* 3.99*  GLUCOSE 249* 180* 161*    Liver Enzymes Recent Labs  Lab 03/28/19 1611 03/28/19 2315 03/29/19 0420  AST 33  --  29  ALT 31  --  28  ALKPHOS 75  --  65  BILITOT 0.9  --  0.5  ALBUMIN 3.1* 2.7* 2.6*    Electrolytes Recent Labs  Lab 03/28/19 2315 03/29/19 0420 03/29/19 0657 03/29/19 1037 03/30/19 0231  CALCIUM 7.1* 6.8* 6.5* 6.5* 6.0*  MG 2.1  --   --   --   --   PHOS 8.6* 6.9*  --   --   --     CBC Recent Labs  Lab 03/28/19 2315 03/29/19 0420 03/30/19 0231  WBC 10.1 9.7 9.8  HGB 8.1* 8.1*  7.0*  HCT 26.5* 25.6* 22.2*  PLT 405* 358 302    ABG No results for input(s): PHART, PCO2ART, PO2ART in the last 168 hours.  Coag's Recent Labs  Lab 03/28/19 2156  INR 2.4*    Sepsis Markers Recent Labs  Lab 03/29/19 0420 03/29/19 0657 03/29/19 1037  LATICACIDVEN 5.6* 5.7* 4.0*    Cardiac Enzymes No results for input(s): TROPONINI, PROBNP in the last  168 hours.  PAST MEDICAL HISTORY :   She  has a past medical history of Anticoagulation goal of INR 2 to 3 (07/15/2018), Bilateral primary osteoarthritis of knee (03/23/2019), Chronic pain syndrome (07/15/2018), Degenerative arthritis of knee (04/2001), Diabetes mellitus with proteinuria ([place]) (03/23/2019), Diabetic peripheral neuropathy ([place]) (10/23/2013), Diabetic peripheral neuropathy ([place]) (10/23/2013), Elevated sed rate, Impaired mobility (05/21/2017), Kidney stone on right side (05/12/2016), Leg swelling (06/14/2015), Lower urinary tract infectious disease, Obstructive sleep apnea (05/19/2018), Proud flesh (09/09/2018), Right knee pain (07/23/2014), Sciatica of left side (12/2003), Secondary hyperparathyroidism ([place]) (05/31/2017), Shortness of breath (10/16/2016), and Spinal stenosis, lumbar (04/2004).  PAST SURGICAL HISTORY:  She  has a past surgical history that includes benign breast tumor; Colposcopy (03/1991); and PTCA (aka ANGIOPLASTY) (07/1993).  Allergies  Allergen Reactions  . Lisinopril     REACTION: bad dreams    No current facility-administered medications on file prior to encounter.    Current Outpatient Medications on File Prior to Encounter  Medication Sig  . apixaban (ELIQUIS) 5 MG TABS tablet Take 1 tablet (5 mg total) by mouth 2 (two) times daily.  . Blood Glucose Monitoring Suppl (ONETOUCH VERIO) w/Device KIT 1 Device by Does not apply route 3 (three) times daily.  . cephALEXin (KEFLEX) 500 MG capsule Take 500 mg by mouth daily.  . enalapril (VASOTEC) 10 MG tablet TAKE 1 TABLET BY MOUTH EVERY DAY (Patient taking  differently: Take 10 mg by mouth daily. )  . furosemide (LASIX) 40 MG tablet TAKE 1 TABLET BY MOUTH TWICE A DAY (Patient taking differently: Take 40 mg by mouth 2 (two) times daily. )  . gabapentin (NEURONTIN) 300 MG capsule Take 1 capsule (300 mg total) by mouth 3 (three) times daily.  . insulin glargine (LANTUS) 100 UNIT/ML injection Inject 0.1 mLs (10 Units total) into the skin daily.  Marland Kitchen KLOR-CON M20 20 MEQ tablet TAKE 1 TABLET BY MOUTH EVERY DAY (Patient taking differently: Take 20 mEq by mouth daily. )  . Lancets (ONETOUCH ULTRASOFT) lancets Use as instructed  . metFORMIN (GLUCOPHAGE) 500 MG tablet TAKE 2 TABLETS BY MOUTH EVERY MORNING AND 1 TABLET EVERY EVENING (Patient taking differently: Take 500-1,000 mg by mouth 2 (two) times daily with a meal. TAKE 2 TABLETS BY MOUTH EVERY MORNING AND 1 TABLET EVERY EVENING)  . metoprolol tartrate (LOPRESSOR) 100 MG tablet TAKE 1 TABLET (100 MG TOTAL) 2 (TWO) TIMES DAILY BY MOUTH.  . predniSONE (DELTASONE) 5 MG tablet Take 3 tablets (15 mg total) by mouth daily with breakfast for 30 days.  . rosuvastatin (CRESTOR) 20 MG tablet Take 0.5 tablets (10 mg total) by mouth daily.  . vitamin B-12 (CYANOCOBALAMIN) 1000 MCG tablet Take 1,000 mcg by mouth daily.  . Vitamin D, Ergocalciferol, (DRISDOL) 50000 units CAPS capsule Take 50,000 Units by mouth once a week.  . ferrous sulfate 325 (65 FE) MG tablet Take 1 tablet (325 mg total) by mouth daily.  . fluticasone (FLONASE) 50 MCG/ACT nasal spray SPRAY 2 SPRAYS INTO EACH NOSTRIL EVERY DAY (Patient taking differently: Place 2 sprays into both nostrils daily as needed for allergies. )  . glucose blood test strip Use as instructed  . glucose blood test strip Use as instructed  . HYDROcodone-acetaminophen (NORCO/VICODIN) 5-325 MG tablet Take 1 tablet by mouth every 8 (eight) hours as needed for moderate pain.  . nitroGLYCERIN (NITROSTAT) 0.4 MG SL tablet PLACE 1 TABLET (0.4 MG TOTAL) UNDER THE TONGUE EVERY 5 (FIVE)  MINUTES AS NEEDED FOR CHEST PAIN.  FAMILY HISTORY:   Her family history includes Cancer in her mother and sister; Cancer (age of onset: 21) in her father; Dementia in her mother; Diabetes in her brother; Heart attack in her sister; Heart disease in her brother; Stroke in her mother.  SOCIAL HISTORY:  She  reports that she has never smoked. She has never used smokeless tobacco. She reports that she does not drink alcohol or use drugs.      The patient is critically ill with multiple organ systems failure and requires high complexity decision making for assessment and support, frequent evaluation and titration of therapies, application of advanced monitoring technologies and extensive interpretation of multiple databases. Critical Care Time devoted to patient care services described in this note is 60 minutes.     Christinia Gully, MD Pulmonary and [place] [phone] After 5:30 PM or weekends, use Beeper [phone]

## 2019-03-30 NOTE — Progress Notes (Addendum)
PROGRESS NOTE    Abigail Duncan  LTJ:[id number] DOB: 01/31/1941 DOA: 03/28/2019 PCP: McDiarmid, Blane Ohara, MD   Brief Narrative:  Rot[place]haus a 78 y.o.femalewith a known history of diabetes, peripheral neuropathy obstructive sleep apnea, spinal stenosis, osteoarthritis, chronic pain syndrome, atrial fibrillation onCoumadinpresents to the emergency department for evaluation ofweakness and altered mental status. Patient was hospitalized for urinary tract infection and subsequently went to rehab. She has been home for the past month however per report she has been having increasing difficulty with weakness, difficulty with ambulation and activities of daily living. Per the son the patient has been having worsening confusion over the last week and hallucinations over the last few days..  On questioning patientcomplains only of weakness.Patient denies fevers/chills, weakness, dizziness, chest pain, shortness of breath, N/V/C/D, abdominal pain, dysuria/frequency, changes in mental status.  Admitting MD discussed with her at length her wishes regarding her medical treatment and advanced directives. She stated that she would consider dialysis if she really needed it but would like to speak with the nephrologist prior to making decision.   I HAD ADDITIONAL CONVERSATION WITH HER SON TODAY BECAUSE OF THE PERSISTENT CONFUSION AND HE CLARIFIED,  She  Did not want to have CPR or intubation, She "didn't want to be on any machines" Patient was made DNR   A/P: Acute kidney injury with hyperkalemia. Hyperkalemia is improved with treatment.  Continue IV fluids we will cautiously.  Continue to avoid nephrotoxic agents as well as holding lisinopril.  Renal ultrasound shows severe atrophy.  BUN and creatinine little improved, nephrology following not a candidate for dialysis, discussed with nephrology today. Also again today, I discussed with patient's son patient's poor prognosis.  He can indicated  patient wants to be DNR and would not want to be on any machines patient was made DNR.  Also I discussed further conversation with palliative care to discuss goals of care.  Palliative care consult placed  Normocytic anemia: Patient is heme-negative B12 greater than 2000, hemoglobin 7 Continue to monitor, continue patient's home iron.  History of chronic UTI -Continue Keflex for now, urine culture shows E. coli, normal white count  History ofatrial fibrillation - Continuemetoprolol blood pressure tolerates, Eliquis   History ofdiabetes - ContinueLantus, Accu-Cheks with regular insulin sliding scale coverage -No changes  History ofneuropathy - Continuegabapentin  Overall patient is a poor prognosis.  Discussions with son as above Patient is now DNR, palliative care consult pending     DVT prophylaxis: QT:[id number]  Code Status: DNR    Code Status Orders  (From admission, onward)         Start     Ordered   03/30/19 1140  Do not attempt resuscitation (DNR)  Continuous    Question Answer Comment  In the event of cardiac or respiratory ARREST Do not call a code blue   In the event of cardiac or respiratory ARREST Do not perform Intubation, CPR, defibrillation or ACLS   In the event of cardiac or respiratory ARREST Use medication by any route, position, wound care, and other measures to relive pain and suffering. May use oxygen, suction and manual treatment of airway obstruction as needed for comfort.   Comments discussed with son who confirmed mother "never wanted to be put on life support machines"      03/30/19 1141        Code Status History    Date Active Date Inactive Code Status Order ID Comments User Context   03/28/2019 2304 03/30/2019 1141 Full  Code [id number]  Harvie Bridge, DO Inpatient   02/15/2019 1836 02/22/2019 0300 Partial Code [id number]  Kathrene Alu, MD ED   Advance Care Planning Activity     Family Communication: Cussed with  patient's son today as above Disposition Plan:   Patient remained inpatient with critical illness requiring IV pressors, fluids, subspecialty support.  Because of patient's poor prognosis palliative care consult placed today. Consults called: Palliative care Admission status: Inpatient   Consultants:   Neurology, palliative care  Procedures:  Ct Abdomen Pelvis Wo Contrast  Result Date: 03/28/2019 CLINICAL DATA:  Generalized weakness, generalized body pain, diverticulitis suspected EXAM: CT ABDOMEN AND PELVIS WITHOUT CONTRAST TECHNIQUE: Multidetector CT imaging of the abdomen and pelvis was performed following the standard protocol without IV contrast. COMPARISON:  None. FINDINGS: Lower chest: Cardiomegaly. Extensive 3 vessel coronary artery calcifications. Hepatobiliary: No solid liver abnormality is seen. Gallstones. No gallbladder wall thickening, or biliary dilatation. Pancreas: Unremarkable. No pancreatic ductal dilatation or surrounding inflammatory changes. Spleen: Normal in size without significant abnormality. Adrenals/Urinary Tract: Adrenal glands are unremarkable. Very atrophic right kidney. Unremarkable left kidney. No hydronephrosis. Bladder is unremarkable. Stomach/Bowel: Stomach is within normal limits. Appendix appears normal. No evidence of bowel wall thickening, distention, or inflammatory changes. Sigmoid diverticulosis. Vascular/Lymphatic: No significant vascular findings are present. No enlarged abdominal or pelvic lymph nodes. Reproductive: No mass or other significant abnormality. Other: No abdominal wall hernia or abnormality. No abdominopelvic ascites. Musculoskeletal: No acute or significant osseous findings. IMPRESSION: 1. No acute noncontrast CT findings of the abdomen or pelvis to explain abdominal pain. 2.  Sigmoid diverticulosis.  No evidence of acute diverticulitis. 3.  Gallstones.  No evidence of acute cholelithiasis. 4.  Other chronic and incidental findings as detailed  above. Electronically Signed   By: Eddie Candle M.D.   On: 03/28/2019 19:08   Dg Abd 1 View  Result Date: 03/29/2019 CLINICAL DATA:  Tube placement EXAM: ABDOMEN - 1 VIEW COMPARISON:  CT dated March 28, 2019 FINDINGS: The enteric tube projects over the expected region of the gastric body. The bowel gas pattern is nonspecific and nonobstructive. IMPRESSION: Enteric tube tip projects over the expected region of the gastric body. Electronically Signed   By: Constance Holster M.D.   On: 03/29/2019 02:55   Ct Head Wo Contrast  Result Date: 03/28/2019 CLINICAL DATA:  Generalized weakness x2 months. EXAM: CT HEAD WITHOUT CONTRAST TECHNIQUE: Contiguous axial images were obtained from the base of the skull through the vertex without intravenous contrast. COMPARISON:  None. FINDINGS: Brain: No evidence of acute infarction, hemorrhage, hydrocephalus, extra-axial collection or mass lesion/mass effect. Age related volume loss and chronic microvascular ischemic changes are noted. Vascular: No hyperdense vessel or unexpected calcification. Skull: Normal. Negative for fracture or focal lesion. Sinuses/Orbits: No acute finding. Other: None. IMPRESSION: 1. No acute intracranial abnormality. 2. Age related volume loss and chronic microvascular ischemic changes are noted. Electronically Signed   By: Constance Holster M.D.   On: 03/28/2019 18:54   US Renal  Result Date: 03/29/2019 CLINICAL DATA:  Acute kidney injury EXAM: RENAL / URINARY TRACT ULTRASOUND COMPLETE COMPARISON:  Abdominal CT from yesterday FINDINGS: Right Kidney: Severe atrophy with marked cortical thinning better seen by prior CT. No hydronephrosis. Renal length is 8.8 cm. Left Kidney: Renal measurements: 12 x 6 x 7 cm = volume: 270 mL. Simple cyst measuring 2.9 cm. No hydronephrosis or abnormal echogenicity. Bladder: Decompressed completely by a Foley catheter. IMPRESSION: 1. Severe right renal atrophy. 2. Unremarkable left kidney. Electronically  Signed    By: Monte Fantasia M.D.   On: 03/29/2019 05:58   Dg Chest Portable 1 View  Result Date: 03/28/2019 CLINICAL DATA:  Generalized weakness. EXAM: PORTABLE CHEST 1 VIEW COMPARISON:  Radiograph of Feb 15, 2019. FINDINGS: Stable cardiomegaly. Atherosclerosis of thoracic aorta is noted. No pneumothorax is noted. Right lung is clear. Probable small left pleural effusion is noted with associated atelectasis. Bony thorax is unremarkable. IMPRESSION: Stable cardiomegaly. Probable small left pleural effusion is noted with associated atelectasis. Aortic Atherosclerosis (ICD10-I70.0). Electronically Signed   By: Marijo Conception M.D.   On: 03/28/2019 16:35     Antimicrobials:   Keflex   Subjective: Patient became hypotensive overnight requiring pressors. Remains intermittently confused.  Objective: Vitals:   03/30/19 0700 03/30/19 0800 03/30/19 0900 03/30/19 1135  BP: (!) 97/50 105/81 112/78 (!) 91/45  Pulse: 62 76 93 90  Resp: 13 14 (!) 22 18  Temp:      TempSrc:      SpO2: 100% 100% 99% 90%  Weight:      Height:        Intake/Output Summary (Last 24 hours) at 03/30/2019 1142 Last data filed at 03/30/2019 0902 Gross per 24 hour  Intake 4153.83 ml  Output 1550 ml  Net 2603.83 ml   Filed Weights   03/28/19 2301 03/30/19 0500  Weight: 109.8 kg 118 kg    Examination:  General exam: Calm this morning answers questions regarding name not oriented to place Respiratory system: Mild rhonchi bilaterally no accessory muscle use Cardiovascular system: S1 & S2 heard, RRR. No JVD, murmurs, rubs, gallops or clicks. No pedal edema. Gastrointestinal system: Abdomen is continued protuberant but nondistended, soft and nontender. No organomegaly or masses felt. Normal bowel sounds heard. Central nervous system: Alert oriented no focal neurological deficits. Extremities: Edema bilaterally, no contractures noted. Skin: No rashes, lesions or ulcers Psychiatry: Judgement and insight IMPAIRED     Data  Reviewed: I have personally reviewed following labs and imaging studies  CBC: Recent Labs  Lab 03/28/19 1611 03/28/19 2315 03/29/19 0420 03/30/19 0231  WBC 14.2* 10.1 9.7 9.8  NEUTROABS 12.0*  --   --  7.3  HGB 9.4* 8.1* 8.1* 7.0*  HCT 31.2* 26.5* 25.6* 22.2*  MCV 89.4 87.7 86.2 85.7  PLT 497* 405* 358 376   Basic Metabolic Panel: Recent Labs  Lab 03/28/19 2315 03/29/19 0420 03/29/19 0657 03/29/19 1037 03/30/19 0231  NA 135 135 138 137 138  K 6.9* 5.9* 4.9 4.9 3.7  CL 94* 93* 96* 94* 89*  CO2 17* 22 22 23  32  GLUCOSE 252* 262* 249* 180* 161*  BUN 109* 116* 115* 109* 92*  CREATININE 5.32* 4.82* 4.73* 3.98* 3.99*  CALCIUM 7.1* 6.8* 6.5* 6.5* 6.0*  MG 2.1  --   --   --   --   PHOS 8.6* 6.9*  --   --   --    GFR: Estimated Creatinine Clearance: 15.1 mL/min (A) (by C-G formula based on SCr of 3.99 mg/dL (H)). Liver Function Tests: Recent Labs  Lab 03/28/19 1611 03/28/19 2315 03/29/19 0420  AST 33  --  29  ALT 31  --  28  ALKPHOS 75  --  65  BILITOT 0.9  --  0.5  PROT 6.4*  --  5.1*  ALBUMIN 3.1* 2.7* 2.6*   No results for input(s): LIPASE, AMYLASE in the last 168 hours. No results for input(s): AMMONIA in the last 168 hours. Coagulation Profile: Recent Labs  Lab 03/28/19 2156  INR 2.4*   Cardiac Enzymes: No results for input(s): CKTOTAL, CKMB, CKMBINDEX, TROPONINI in the last 168 hours. BNP (last 3 results) No results for input(s): PROBNP in the last 8760 hours. HbA1C: Recent Labs    03/29/19 0420  HGBA1C 6.8*   CBG: Recent Labs  Lab 03/28/19 1601 03/28/19 2055 03/29/19 0109 03/29/19 2238 03/30/19 0800  GLUCAP 125* 153* 284* 211* 127*   Lipid Profile: No results for input(s): CHOL, HDL, LDLCALC, TRIG, CHOLHDL, LDLDIRECT in the last 72 hours. Thyroid Function Tests: No results for input(s): TSH, T4TOTAL, FREET4, T3FREE, THYROIDAB in the last 72 hours. Anemia Panel: Recent Labs    03/28/19 2315  VITAMINB12 2,337*  FERRITIN 43  TIBC 299    IRON 47   Sepsis Labs: Recent Labs  Lab 03/29/19 0420 03/29/19 0657 03/29/19 1037 03/30/19 0847  LATICACIDVEN 5.6* 5.7* 4.0* 2.0*    Recent Results (from the past 240 hour(s))  Urine culture     Status: Abnormal   Collection Time: 03/28/19  3:53 PM   Specimen: Urine, Clean Catch  Result Value Ref Range Status   Specimen Description   Final    URINE, CLEAN CATCH Performed at [facility], [place] [address], [place], [place] [address]    Special Requests   Final    NONE Performed at [facility], [place] [address] [place], [place] [address]    Culture 80,000 COLONIES/mL ESCHERICHIA COLI (A)  Final   Report Status 03/30/2019 FINAL  Final   Organism ID, Bacteria ESCHERICHIA COLI (A)  Final      Susceptibility   Escherichia coli - MIC*    AMPICILLIN <=2 SENSITIVE Sensitive     CEFAZOLIN <=4 SENSITIVE Sensitive     CEFTRIAXONE <=1 SENSITIVE Sensitive     CIPROFLOXACIN <=0.25 SENSITIVE Sensitive     GENTAMICIN 2 SENSITIVE Sensitive     IMIPENEM <=0.25 SENSITIVE Sensitive     NITROFURANTOIN <=16 SENSITIVE Sensitive     TRIMETH/SULFA <=20 SENSITIVE Sensitive     AMPICILLIN/SULBACTAM <=2 SENSITIVE Sensitive     PIP/TAZO <=4 SENSITIVE Sensitive     Extended ESBL NEGATIVE Sensitive     * 80,000 COLONIES/mL ESCHERICHIA COLI  SARS Coronavirus 2 (CEPHEID - Performed in [place] hospital lab), Hosp Order     Status: None   Collection Time: 03/28/19  4:11 PM   Specimen: Nasopharyngeal Swab  Result Value Ref Range Status   SARS Coronavirus 2 NEGATIVE NEGATIVE Final    Comment: (NOTE) If result is NEGATIVE SARS-CoV-2 target nucleic acids are NOT DETECTED. The SARS-CoV-2 RNA is generally detectable in upper and lower  respiratory specimens during the acute phase of infection. The lowest  concentration of SARS-CoV-2 viral copies this assay can detect is 250  copies / mL. A negative result does not preclude SARS-CoV-2 infection  and should not be  used as the sole basis for treatment or other  patient management decisions.  A negative result may occur with  improper specimen collection / handling, submission of specimen other  than nasopharyngeal swab, presence of viral mutation(s) within the  areas targeted by this assay, and inadequate number of viral copies  (<250 copies / mL). A negative result must be combined with clinical  observations, patient history, and epidemiological information. If result is POSITIVE SARS-CoV-2 target nucleic acids are DETECTED. The SARS-CoV-2 RNA is generally detectable in upper and lower  respiratory specimens dur ing the acute phase of infection.  Positive  results  are indicative of active infection with SARS-CoV-2.  Clinical  correlation with patient history and other diagnostic information is  necessary to determine patient infection status.  Positive results do  not rule out bacterial infection or co-infection with other viruses. If result is PRESUMPTIVE POSTIVE SARS-CoV-2 nucleic acids MAY BE PRESENT.   A presumptive positive result was obtained on the submitted specimen  and confirmed on repeat testing.  While 2019 novel coronavirus  (SARS-CoV-2) nucleic acids may be present in the submitted sample  additional confirmatory testing may be necessary for epidemiological  and / or clinical management purposes  to differentiate between  SARS-CoV-2 and other Sarbecovirus currently known to infect humans.  If clinically indicated additional testing with an alternate test  methodology [phone]) is advised. The SARS-CoV-2 RNA is generally  detectable in upper and lower respiratory sp ecimens during the acute  phase of infection. The expected result is Negative. Fact Sheet for Patients:  StrictlyIdeas.no Fact Sheet for Healthcare Providers: BankingDealers.co.za This test is not yet approved or cleared by the [place] FDA and has been authorized  for detection and/or diagnosis of SARS-CoV-2 by FDA under an Emergency Use Authorization (EUA).  This EUA will remain in effect (meaning this test can be used) for the duration of the COVID-19 declaration under Section 564(b)(1) of the Act, 21 U.S.C. section 360bbb-3(b)(1), unless the authorization is terminated or revoked sooner. Performed at [facility], [place] [address], [place], [place] [address]   MRSA PCR Screening     Status: None   Collection Time: 03/28/19 11:05 PM   Specimen: Nasal Mucosa; Nasopharyngeal  Result Value Ref Range Status   MRSA by PCR NEGATIVE NEGATIVE Final    Comment:        The GeneXpert MRSA Assay (FDA approved for NASAL specimens only), is one component of a comprehensive MRSA colonization surveillance program. It is not intended to diagnose MRSA infection nor to guide or monitor treatment for MRSA infections. Performed at [facility], [place] [address], [place], [place] [address]          Radiology Studies: Ct Abdomen Pelvis Wo Contrast  Result Date: 03/28/2019 CLINICAL DATA:  Generalized weakness, generalized body pain, diverticulitis suspected EXAM: CT ABDOMEN AND PELVIS WITHOUT CONTRAST TECHNIQUE: Multidetector CT imaging of the abdomen and pelvis was performed following the standard protocol without IV contrast. COMPARISON:  None. FINDINGS: Lower chest: Cardiomegaly. Extensive 3 vessel coronary artery calcifications. Hepatobiliary: No solid liver abnormality is seen. Gallstones. No gallbladder wall thickening, or biliary dilatation. Pancreas: Unremarkable. No pancreatic ductal dilatation or surrounding inflammatory changes. Spleen: Normal in size without significant abnormality. Adrenals/Urinary Tract: Adrenal glands are unremarkable. Very atrophic right kidney. Unremarkable left kidney. No hydronephrosis. Bladder is unremarkable. Stomach/Bowel: Stomach is within normal limits. Appendix appears normal. No evidence  of bowel wall thickening, distention, or inflammatory changes. Sigmoid diverticulosis. Vascular/Lymphatic: No significant vascular findings are present. No enlarged abdominal or pelvic lymph nodes. Reproductive: No mass or other significant abnormality. Other: No abdominal wall hernia or abnormality. No abdominopelvic ascites. Musculoskeletal: No acute or significant osseous findings. IMPRESSION: 1. No acute noncontrast CT findings of the abdomen or pelvis to explain abdominal pain. 2.  Sigmoid diverticulosis.  No evidence of acute diverticulitis. 3.  Gallstones.  No evidence of acute cholelithiasis. 4.  Other chronic and incidental findings as detailed above. Electronically Signed   By: Eddie Candle M.D.   On: 03/28/2019 19:08   Dg Abd 1 View  Result Date: 03/29/2019 CLINICAL DATA:  Tube placement EXAM: ABDOMEN - 1 VIEW COMPARISON:  CT dated March 28, 2019 FINDINGS: The enteric tube projects over the expected region of the gastric body. The bowel gas pattern is nonspecific and nonobstructive. IMPRESSION: Enteric tube tip projects over the expected region of the gastric body. Electronically Signed   By: Constance Holster M.D.   On: 03/29/2019 02:55   Ct Head Wo Contrast  Result Date: 03/28/2019 CLINICAL DATA:  Generalized weakness x2 months. EXAM: CT HEAD WITHOUT CONTRAST TECHNIQUE: Contiguous axial images were obtained from the base of the skull through the vertex without intravenous contrast. COMPARISON:  None. FINDINGS: Brain: No evidence of acute infarction, hemorrhage, hydrocephalus, extra-axial collection or mass lesion/mass effect. Age related volume loss and chronic microvascular ischemic changes are noted. Vascular: No hyperdense vessel or unexpected calcification. Skull: Normal. Negative for fracture or focal lesion. Sinuses/Orbits: No acute finding. Other: None. IMPRESSION: 1. No acute intracranial abnormality. 2. Age related volume loss and chronic microvascular ischemic changes are noted.  Electronically Signed   By: Constance Holster M.D.   On: 03/28/2019 18:54   US Renal  Result Date: 03/29/2019 CLINICAL DATA:  Acute kidney injury EXAM: RENAL / URINARY TRACT ULTRASOUND COMPLETE COMPARISON:  Abdominal CT from yesterday FINDINGS: Right Kidney: Severe atrophy with marked cortical thinning better seen by prior CT. No hydronephrosis. Renal length is 8.8 cm. Left Kidney: Renal measurements: 12 x 6 x 7 cm = volume: 270 mL. Simple cyst measuring 2.9 cm. No hydronephrosis or abnormal echogenicity. Bladder: Decompressed completely by a Foley catheter. IMPRESSION: 1. Severe right renal atrophy. 2. Unremarkable left kidney. Electronically Signed   By: Monte Fantasia M.D.   On: 03/29/2019 05:58   Dg Chest Portable 1 View  Result Date: 03/28/2019 CLINICAL DATA:  Generalized weakness. EXAM: PORTABLE CHEST 1 VIEW COMPARISON:  Radiograph of Feb 15, 2019. FINDINGS: Stable cardiomegaly. Atherosclerosis of thoracic aorta is noted. No pneumothorax is noted. Right lung is clear. Probable small left pleural effusion is noted with associated atelectasis. Bony thorax is unremarkable. IMPRESSION: Stable cardiomegaly. Probable small left pleural effusion is noted with associated atelectasis. Aortic Atherosclerosis (ICD10-I70.0). Electronically Signed   By: Marijo Conception M.D.   On: 03/28/2019 16:35        Scheduled Meds:  cephALEXin  500 mg Oral Daily   Chlorhexidine Gluconate Cloth  6 each Topical Q0600   ferrous sulfate  325 mg Oral Daily   insulin aspart  0-5 Units Subcutaneous QHS   insulin aspart  0-9 Units Subcutaneous TID WC   insulin glargine  10 Units Subcutaneous Daily   mouth rinse  15 mL Mouth Rinse BID   predniSONE  15 mg Oral Q breakfast   vitamin B-12  1,000 mcg Oral Daily   Continuous Infusions:  sodium chloride     sodium chloride     norepinephrine (LEVOPHED) Adult infusion 5 mcg/min (03/30/19 0902)     LOS: 2 days    Time spent: [place]    Nicolette Bang, MD [facility]  If 7PM-7AM, please contact night-coverage  03/30/2019, 11:42 AM

## 2019-03-30 NOTE — Progress Notes (Signed)
DNR status noted, renal has signed off, palliative care has been called  Would wean off levophed  for sbp > 90 and not restart  Call if needed    Christinia Gully, MD [facility] [place] [phone] After 5:30 PM or weekends, use [facility] [phone]

## 2019-03-30 NOTE — Progress Notes (Signed)
Attempts times 3 for arterial line placement without success. Patient tolerated attempts well.  Also unable to obtain enough arterial blood for ABG analysis. RN and MD aware.

## 2019-03-30 NOTE — Progress Notes (Signed)
Pt. Had a left shoulder IV that was placed but not charted in chart. Pt. Recently pulled out the IV. Pt. is confused. Catheter was intact.

## 2019-03-30 NOTE — Progress Notes (Signed)
Pt responding well to levophed.  BP trending up with titration.

## 2019-03-30 NOTE — Consult Note (Signed)
Consultation Note Date: 03/30/2019   Patient Name: Abigail Duncan  DOB: 11/29/1940  MRN: [id number]  Age / Sex: 78 y.o., female  PCP: McDiarmid, Blane Ohara, MD Referring Physician: Marcell Anger*  Reason for Consultation: Establishing goals of care  HPI/Patient Profile: 78 y.o. female  admitted on 03/28/2019   Abigail Duncan a 78 y.o.femalewith a known history of diabetes, peripheral neuropathy obstructive sleep apnea, spinal stenosis, osteoarthritis, chronic pain syndrome, atrial fibrillation onCoumadinpresents to the emergency department for evaluation ofweakness and altered mental status. Patient was hospitalized for urinary tract infection and subsequently went to rehab. She has been home for the past month however per report she has been having increasing difficulty with weakness, difficulty with ambulation and activities of daily living. Per the son the patient has been having worsening confusion over the last week and hallucinations over the last few days.  Clinical Assessment and Goals of Care: Patient remains admitted to hospital medicine service, in the stepdown unit at [facility] in [place], [place] for acute kidney injury with hyperkalemia, low blood pressures, renal imaging showing severe atrophy.   A palliative consult has been requested for continuing goals of care discussions, renal colleagues have been following, PCCM has also been following.   The patient is resting in bed, she appears asleep. She is able to arouse some, state her name. She tells me that she is in the hospital because of her blood pressure getting low, she knows that her kidney numbers don't look good. She tells me she has one kidney.   I introduced myself and palliative care as follows: Palliative medicine is specialized medical care for people living with serious illness. It focuses on providing relief  from the symptoms and stress of a serious illness. The goal is to improve quality of life for both the patient and the family.  The patient is only able to answer very few questions, she doesn't become alert, she doesn't engage or interact with me. She is not able to engage in detailed goals of care discussions.   I called the patient's son Kyndra Condron at [phone]. I introduced about goals of care discussions as follows:  Goals of care: Broad aims of medical therapy in relation to the patient's values and preferences. Our aim is to provide medical care aimed at enabling patients to achieve the goals that matter most to them, given the circumstances of their particular medical situation and their constraints.   The patient's son states that they live together in [place], [place]. The patient has 2 sons, another son is currently in prison. The patient has had gradual progressive decline, especially since the past month or so.she has been more confused, sleeping more and eating less. Son states she has been going to [facility] for years now. She has several underlying co morbidities.   We discussed about what would be important to the patient going forward. Discussed with son about final recommendations from nephrology being that the patient is not a dialysis candidate and that palliative  care might be more appropriate going forward.   Comfort measures and hospice philosophy of care discussed with son on the phone. In particular, the type of care that can be provided in a residential hospice discussed with son on the phone as well.   See below.     NEXT OF KIN  son Dawnetta Copenhaver at [phone].   SUMMARY OF RECOMMENDATIONS   Agree with DNR DNI. Ongoing discussions with son and patient about comfort measures and hospice. Will need further discussions, monitor renal indices, hospital course. It appears that a mode of care that focuses primarily on comfort measures appears to be most  prudent, given the patient's current condition and her underlying co morbidities.   PMT to follow.   Code Status/Advance Care Planning:  DNR    Symptom Management:    as above   Palliative Prophylaxis:   Delirium Protocol   Psycho-social/Spiritual:   Desire for further Chaplaincy support:yes  Additional Recommendations: Education on Hospice  Prognosis:   Guarded   Discharge Planning: To Be Determined      Primary Diagnoses: Present on Admission: . AKI (acute kidney injury) ([place]) . Anemia of chronic disease . Type II diabetes mellitus with neurological manifestations ([place]) . Atrial fibrillation, permanent . Pure hypercholesterolemia . Morbid obesity ([place]) with obstructive sleep apnea . Toxic metabolic encephalopathy   I have reviewed the medical record, interviewed the patient and family, and examined the patient. The following aspects are pertinent.  Past Medical History:  Diagnosis Date  . Anticoagulation goal of INR 2 to 3 07/15/2018   Atrial fibrillation, permanent  . Bilateral primary osteoarthritis of knee 03/23/2019  . Chronic pain syndrome 07/15/2018  . Degenerative arthritis of knee 04/2001   by xray  . Diabetes mellitus with proteinuria ([place]) 03/23/2019  . Diabetic peripheral neuropathy ([place]) 10/23/2013  . Diabetic peripheral neuropathy ([place]) 10/23/2013  . Elevated sed rate   . Impaired mobility 05/21/2017  . Kidney stone on right side 05/12/2016   [place] 05/2016 - non-obstructing right 6 mm stone  . Leg swelling 06/14/2015  . Lower urinary tract infectious disease   . Obstructive sleep apnea 05/19/2018   Sleep study showed some apnea and nighttime desaturations. However, did not meet criteria for CPAP.  Marland Kitchen [place] flesh 09/09/2018  . Right knee pain 07/23/2014  . Sciatica of left side 12/2003   DDD L5,S1   . Secondary hyperparathyroidism ([place]) 05/31/2017  . Shortness of breath 10/16/2016  . Spinal stenosis, lumbar 04/2004   mild on MRI at L 4-5   Social  History   Socioeconomic History  . Marital status: Widowed    Spouse name: Not on file  . Number of children: 2  . Years of education: Not on file  . Highest education level: Not on file  Occupational History  . Occupation: Lawyer    Comment: Retired  Scientific laboratory technician  . Financial resource strain: Somewhat hard  . Food insecurity    Worry: Sometimes true    Inability: Sometimes true  . Transportation needs    Medical: No    Non-medical: No  Tobacco Use  . Smoking status: Never Smoker  . Smokeless tobacco: Never Used  Substance and Sexual Activity  . Alcohol use: No  . Drug use: No  . Sexual activity: Not Currently  Lifestyle  . Physical activity    Days per week: 0 days    Minutes per session: 0 min  . Stress: Rather much  Relationships  .  Social Herbalist on phone: Not on file    Gets together: Not on file    Attends religious service: Not on file    Active member of club or organization: Not on file    Attends meetings of clubs or organizations: Not on file    Relationship status: Not on file  Other Topics Concern  . Not on file  Social History Narrative   Husband died of lung cancer   Disabled (TBI as child) son, Elie Gragert, lives with her in [place], [place], lives in town, had severe auto accident a few years ago   Family History  Problem Relation Age of Onset  . Cancer Mother        breast, dementia  . Dementia Mother   . Stroke Mother   . Cancer Father 51       colon  . Heart disease Brother   . Cancer Sister        lung  . Heart attack Sister   . Diabetes Brother    Scheduled Meds: . cephALEXin  500 mg Oral Daily  . Chlorhexidine Gluconate Cloth  6 each Topical Q0600  . ferrous sulfate  325 mg Oral Daily  . insulin aspart  0-5 Units Subcutaneous QHS  . insulin aspart  0-9 Units Subcutaneous TID WC  . insulin glargine  10 Units Subcutaneous Daily  . mouth rinse  15 mL Mouth Rinse BID  . predniSONE  15 mg Oral  Q breakfast  . vitamin B-12  1,000 mcg Oral Daily   Continuous Infusions: . sodium chloride    . sodium chloride    . norepinephrine (LEVOPHED) Adult infusion 2 mcg/min (03/30/19 1521)   PRN Meds:.Place/Maintain arterial line **AND** sodium chloride, acetaminophen **OR** acetaminophen, albuterol, bisacodyl, fluticasone, magnesium citrate, nitroGLYCERIN, ondansetron **OR** ondansetron (ZOFRAN) IV, oxyCODONE, senna-docusate Medications Prior to Admission:  Prior to Admission medications   Medication Sig Start Date End Date Taking? Authorizing Provider  apixaban (ELIQUIS) 5 MG TABS tablet Take 1 tablet (5 mg total) by mouth 2 (two) times daily. 02/21/19  Yes Mullis, Kiersten P, DO  Blood Glucose Monitoring Suppl (ONETOUCH VERIO) w/Device KIT 1 Device by Does not apply route 3 (three) times daily. 03/28/19  Yes McDiarmid, Blane Ohara, MD  cephALEXin (KEFLEX) 500 MG capsule Take 500 mg by mouth daily.   Yes [provider]  enalapril (VASOTEC) 10 MG tablet TAKE 1 TABLET BY MOUTH EVERY DAY Patient taking differently: Take 10 mg by mouth daily.  04/22/18  Yes Jettie Booze, MD  furosemide (LASIX) 40 MG tablet TAKE 1 TABLET BY MOUTH TWICE A DAY Patient taking differently: Take 40 mg by mouth 2 (two) times daily.  04/29/18  Yes Jettie Booze, MD  gabapentin (NEURONTIN) 300 MG capsule Take 1 capsule (300 mg total) by mouth 3 (three) times daily. 02/21/19  Yes Mullis, Kiersten P, DO  insulin glargine (LANTUS) 100 UNIT/ML injection Inject 0.1 mLs (10 Units total) into the skin daily. 02/22/19  Yes Mullis, Kiersten P, DO  KLOR-CON M20 20 MEQ tablet TAKE 1 TABLET BY MOUTH EVERY DAY Patient taking differently: Take 20 mEq by mouth daily.  01/02/19  Yes Jettie Booze, MD  Lancets [facility] ULTRASOFT) lancets Use as instructed 03/28/19  Yes McDiarmid, Blane Ohara, MD  metFORMIN (GLUCOPHAGE) 500 MG tablet TAKE 2 TABLETS BY MOUTH EVERY MORNING AND 1 TABLET EVERY EVENING Patient taking differently:  Take 500-1,000 mg by mouth 2 (two) times daily  with a meal. TAKE 2 TABLETS BY MOUTH EVERY MORNING AND 1 TABLET EVERY EVENING 12/05/18  Yes McDiarmid, Blane Ohara, MD  metoprolol tartrate (LOPRESSOR) 100 MG tablet TAKE 1 TABLET (100 MG TOTAL) 2 (TWO) TIMES DAILY BY MOUTH. 08/03/18  Yes McDiarmid, Blane Ohara, MD  predniSONE (DELTASONE) 5 MG tablet Take 3 tablets (15 mg total) by mouth daily with breakfast for 30 days. 03/23/19 04/22/19 Yes McDiarmid, Blane Ohara, MD  rosuvastatin (CRESTOR) 20 MG tablet Take 0.5 tablets (10 mg total) by mouth daily. 03/28/19  Yes McDiarmid, Blane Ohara, MD  vitamin B-12 (CYANOCOBALAMIN) 1000 MCG tablet Take 1,000 mcg by mouth daily.   Yes [provider]  Vitamin D, Ergocalciferol, (DRISDOL) 50000 units CAPS capsule Take 50,000 Units by mouth once a week. 01/12/18  Yes [provider]  ferrous sulfate 325 (65 FE) MG tablet Take 1 tablet (325 mg total) by mouth daily. 03/28/19   McDiarmid, Blane Ohara, MD  fluticasone (FLONASE) 50 MCG/ACT nasal spray SPRAY 2 SPRAYS INTO EACH NOSTRIL EVERY DAY Patient taking differently: Place 2 sprays into both nostrils daily as needed for allergies.  07/25/18   McDiarmid, Blane Ohara, MD  glucose blood test strip Use as instructed 03/28/19   McDiarmid, Blane Ohara, MD  glucose blood test strip Use as instructed 03/28/19   McDiarmid, Blane Ohara, MD  HYDROcodone-acetaminophen (NORCO/VICODIN) 5-325 MG tablet Take 1 tablet by mouth every 8 (eight) hours as needed for moderate pain. 12/27/18   McDiarmid, Blane Ohara, MD  nitroGLYCERIN (NITROSTAT) 0.4 MG SL tablet PLACE 1 TABLET (0.4 MG TOTAL) UNDER THE TONGUE EVERY 5 (FIVE) MINUTES AS NEEDED FOR CHEST PAIN. 07/28/17   McDiarmid, Blane Ohara, MD   Allergies  Allergen Reactions  . Lisinopril     REACTION: bad dreams   Review of Systems Confused  Physical Exam Patient is confused Doesn't engage much Interacts some briefly Irregular Diminished breath sounds Abdomen is distended Edema  Vital Signs: BP (!) 100/48   Pulse  69   Temp 97.6 F (36.4 C) (Axillary)   Resp 14   Ht '5\' 3"'  (1.6 m)   Wt 118 kg   LMP  (LMP Unknown)   SpO2 90%   BMI 46.08 kg/m  Pain Scale: 0-10 POSS *See Group Information*: 1-Acceptable,Awake and alert Pain Score: Asleep   SpO2: SpO2: 90 % O2 Device:SpO2: 90 % O2 Flow Rate: .O2 Flow Rate (L/min): 2 L/min  IO: Intake/output summary:   Intake/Output Summary (Last 24 hours) at 03/30/2019 1625 Last data filed at 03/30/2019 1301 Gross per 24 hour  Intake 4213.22 ml  Output 1550 ml  Net 2663.22 ml    LBM: Last BM Date: 03/29/19 Baseline Weight: Weight: 109.8 kg Most recent weight: Weight: 118 kg     Palliative Assessment/Data:     Time In:  1500 Time Out:  1610 Time Total:  70 min  Greater than 50%  of this time was spent counseling and coordinating care related to the above assessment and plan.  Signed by: Loistine Chance, MD  [phone] Please contact Palliative Medicine Team phone at [phone] for questions and concerns.  For individual provider: See Shea Evans

## 2019-03-30 NOTE — Progress Notes (Signed)
OT Cancellation Note  Patient Details Name: Abigail Duncan MRN: [id number] DOB: 23-May-1941   Cancelled Treatment:    Reason Eval/Treat Not Completed: Fatigue/lethargy limiting ability to participate  Will check back on pt next day  Kari Baars, [place] [phone] Office- [phone], Thereasa Parkin 03/30/2019, 4:07 PM

## 2019-03-30 NOTE — Progress Notes (Signed)
Pt continues to be agitated and anxious.   Asking for anxiety medicine.  NP [place] notified.  Orders received.

## 2019-03-30 NOTE — Progress Notes (Signed)
Pt is aroused, following commands, confused at to place, but alert to self, year.  Pt pulling at safety mitts, trying to pull off gown.  Very resless.  BP up to 96/40s.  NP [place] notified.  Will monitor closely for now.  If patient calms down and BP falls again, will attempt [place] again.

## 2019-03-30 NOTE — Procedures (Signed)
Arterial Catheter Insertion Procedure Note SKYLEIGH WINDLE [id number] [birth year]  Procedure: Insertion of Arterial Catheter  Indications: Blood pressure monitoring and Frequent blood sampling  Procedure Details Consent: Risks of procedure as well as the alternatives and risks of each were explained to the (patient/caregiver).  Consent for procedure obtained. Time Out: Verified patient identification, verified procedure, site/side was marked, verified correct patient position, special equipment/implants available, medications/allergies/relevent history reviewed, required imaging and test results available.  Performed  Maximum sterile technique was used including antiseptics, cap, gloves, gown, hand hygiene, mask and sheet. Skin prep: Chlorhexidine; local anesthetic administered 20 gauge catheter was inserted into right radial artery using the Seldinger technique. ULTRASOUND GUIDANCE USED: NO Evaluation Blood flow good; BP tracing good. Complications: No apparent complications.   Johnette Abraham 03/30/2019

## 2019-03-30 NOTE — Progress Notes (Signed)
Son notified of pts difficulty with blood pressure.  Consent obtained for radial arterial line.

## 2019-03-31 ENCOUNTER — Other Ambulatory Visit: Payer: Self-pay

## 2019-03-31 ENCOUNTER — Other Ambulatory Visit: Payer: Self-pay | Admitting: *Deleted

## 2019-03-31 LAB — CBC WITH DIFFERENTIAL/PLATELET
Abs Immature Granulocytes: 0.15 10*3/uL — ABNORMAL HIGH (ref 0.00–0.07)
Basophils Absolute: 0 10*3/uL (ref 0.0–0.1)
Basophils Relative: 0 %
Eosinophils Absolute: 0.1 10*3/uL (ref 0.0–0.5)
Eosinophils Relative: 1 %
HCT: 25.8 % — ABNORMAL LOW (ref 36.0–46.0)
Hemoglobin: 7.7 g/dL — ABNORMAL LOW (ref 12.0–15.0)
Immature Granulocytes: 2 %
Lymphocytes Relative: 19 %
Lymphs Abs: 1.9 10*3/uL (ref 0.7–4.0)
MCH: 26.8 pg (ref 26.0–34.0)
MCHC: 29.8 g/dL — ABNORMAL LOW (ref 30.0–36.0)
MCV: 89.9 fL (ref 80.0–100.0)
Monocytes Absolute: 1 10*3/uL (ref 0.1–1.0)
Monocytes Relative: 10 %
Neutro Abs: 7 10*3/uL (ref 1.7–7.7)
Neutrophils Relative %: 68 %
Platelets: 324 10*3/uL (ref 150–400)
RBC: 2.87 MIL/uL — ABNORMAL LOW (ref 3.87–5.11)
RDW: 18.2 % — ABNORMAL HIGH (ref 11.5–15.5)
WBC: 10.1 10*3/uL (ref 4.0–10.5)
nRBC: 0.2 % (ref 0.0–0.2)

## 2019-03-31 LAB — GLUCOSE, CAPILLARY
Glucose-Capillary: 139 mg/dL — ABNORMAL HIGH (ref 70–99)
Glucose-Capillary: 137 mg/dL — ABNORMAL HIGH (ref 70–99)
Glucose-Capillary: 202 mg/dL — ABNORMAL HIGH (ref 70–99)
Glucose-Capillary: 144 mg/dL — ABNORMAL HIGH (ref 70–99)

## 2019-03-31 LAB — BASIC METABOLIC PANEL
Anion gap: 16 — ABNORMAL HIGH (ref 5–15)
BUN: 78 mg/dL — ABNORMAL HIGH (ref 8–23)
CO2: 31 mmol/L (ref 22–32)
Calcium: 6.9 mg/dL — ABNORMAL LOW (ref 8.9–10.3)
Chloride: 93 mmol/L — ABNORMAL LOW (ref 98–111)
Creatinine, Ser: 2.94 mg/dL — ABNORMAL HIGH (ref 0.44–1.00)
GFR calc Af Amer: 17 mL/min — ABNORMAL LOW (ref >60)
GFR calc non Af Amer: 15 mL/min — ABNORMAL LOW (ref >60)
Glucose, Bld: 138 mg/dL — ABNORMAL HIGH (ref 70–99)
Potassium: 2.8 mmol/L — ABNORMAL LOW (ref 3.5–5.1)
Sodium: 140 mmol/L (ref 135–145)

## 2019-03-31 LAB — MAGNESIUM: Magnesium: 1.6 mg/dL — ABNORMAL LOW (ref 1.7–2.4)

## 2019-03-31 LAB — PHOSPHORUS: Phosphorus: 4.5 mg/dL (ref 2.5–4.6)

## 2019-03-31 MED ORDER — MAGNESIUM SULFATE IN D5W 1-5 GM/100ML-% IV SOLN
1.0000 g | Freq: Once | INTRAVENOUS | Status: AC
  Administered 2019-03-31: 04:00:00 1 g via INTRAVENOUS
  Filled 2019-03-31: qty 100

## 2019-03-31 MED ORDER — POTASSIUM CHLORIDE CRYS ER 20 MEQ PO TBCR
40.0000 meq | EXTENDED_RELEASE_TABLET | Freq: Once | ORAL | Status: AC
  Administered 2019-03-31: 04:00:00 40 meq via ORAL
  Filled 2019-03-31: qty 2

## 2019-03-31 NOTE — Progress Notes (Signed)
PT Cancellation Note  Patient Details Name: Abigail Duncan MRN: [id number] DOB: [birth year]   Cancelled Treatment:     pt in bed with eyes shut and indicated she was not wanting to participate.  Responded to all questions but kept eyes shut and head turned opposite side. Breakfast tray untouched in front of her.     Rica Koyanagi  PTA Acute  Rehabilitation Services Pager      [phone] Office      [phone]

## 2019-03-31 NOTE — Progress Notes (Signed)
PROGRESS NOTE    Abigail Duncan  ZGY:[id number] DOB: July 22, 1941 DOA: 03/28/2019 PCP: McDiarmid, Blane Ohara, MD   Brief Narrative:   Callas a 78 y.o.femalewith a known history of diabetes, peripheral neuropathy obstructive sleep apnea, spinal stenosis, osteoarthritis, chronic pain syndrome, atrial fibrillation onCoumadinpresents to the emergency department for evaluation ofweakness and altered mental status. Patient was hospitalized for urinary tract infection and subsequently went to rehab. She has been home for the past month however per report she has been having increasing difficulty with weakness, difficulty with ambulation and activities of daily living. Per the son the patient has been having worsening confusion over the last week and hallucinations over the last few days..  On questioning patientcomplains only of weakness.Patient denies fevers/chills, weakness, dizziness, chest pain, shortness of breath, N/V/C/D, abdominal pain, dysuria/frequency, changes in mental status.  Admitting MD discussed with her at length her wishes regarding her medical treatment and advanced directives. She stated that she would consider dialysis if she really needed it but would like to speak with the nephrologist prior to making decision.   I HAD ADDITIONAL CONVERSATION WITH HER SON AS DID PALLIATIVE CARE BECAUSE OF THE PERSISTENT CONFUSION/DECLINE AND HE CLARIFIED,  She  Did not want to have CPR or intubation, She "didn't want to be on any machines" Patient was made DNR  Assessment & Plan:   Active Problems:   Type II diabetes mellitus with neurological manifestations ([place])   Pure hypercholesterolemia   Morbid obesity (HCC) with obstructive sleep apnea   Atrial fibrillation, permanent   Goals of care, counseling/discussion   Anemia of chronic disease   AKI (acute kidney injury) ([place])   Shock circulatory (HCC)   Toxic metabolic encephalopathy   Acute renal failure ([place])  Palliative care by specialist   Pressure injury of skin   Acute kidney injury with hyperkalemia. Patient with progressive decline, nephrology following, not a dialysis candidate, Now requiring pressors  Normocytic anemia: Patient is heme-negative B12 greater than 2000, hemoglobin 7 Continue to monitor, continue patient's home iron.  History of acute  UTI with SEVERE SEPSIS -Continue Keflexfor now, urine culture shows E. coli, normal white count -on pressors-meeting with palliative with hospice consideration pending -will not broaden out abx at this point given the discussion of hospice care.  History ofatrial fibrillation -Blood pressure meds in the setting of hypotension  History ofdiabetes - ContinueLantus, Accu-Cheks with regular insulin sliding scale coverage -No changes  History ofneuropathy - Continuegabapentin  Overall patient is a poor prognosis.  Patient is now DNR, palliative care consult appreciated, further discussions regarding hospice care pending further discussions with son and patient  DVT prophylaxis: SCD/Compression stockings  Code Status: DNR    Code Status Orders  (From admission, onward)         Start     Ordered   03/30/19 1140  Do not attempt resuscitation (DNR)  Continuous    Question Answer Comment  In the event of cardiac or respiratory ARREST Do not call a "code blue"   In the event of cardiac or respiratory ARREST Do not perform Intubation, CPR, defibrillation or ACLS   In the event of cardiac or respiratory ARREST Use medication by any route, position, wound care, and other measures to relive pain and suffering. May use oxygen, suction and manual treatment of airway obstruction as needed for comfort.   Comments discussed with son who confirmed mother "never wanted to be put on life support machines"      03/30/19 1141  Code Status History    Date Active Date Inactive Code Status Order ID Comments User Context    03/28/2019 2304 03/30/2019 1141 Full Code [id number]  Harvie Bridge, DO Inpatient   02/15/2019 1836 02/22/2019 0300 Partial Code [id number]  Kathrene Alu, MD ED   Advance Care Planning Activity     Family Communication: none today-pending palliative f/up Disposition Plan:   Patient remained inpatient with critical illness requiring IV pressors, fluids, subspecialty support.  Because of patient's poor prognosis palliative care consult in progress with possible transition to hospice, without current treatments at high risk of severe life-threatening decompensation Consults called: palliative, PCCM Admission status: Inpatient   Consultants:   AS ABOVE  Procedures:  Ct Abdomen Pelvis Wo Contrast  Result Date: 03/28/2019 CLINICAL DATA:  Generalized weakness, generalized body pain, diverticulitis suspected EXAM: CT ABDOMEN AND PELVIS WITHOUT CONTRAST TECHNIQUE: Multidetector CT imaging of the abdomen and pelvis was performed following the standard protocol without IV contrast. COMPARISON:  None. FINDINGS: Lower chest: Cardiomegaly. Extensive 3 vessel coronary artery calcifications. Hepatobiliary: No solid liver abnormality is seen. Gallstones. No gallbladder wall thickening, or biliary dilatation. Pancreas: Unremarkable. No pancreatic ductal dilatation or surrounding inflammatory changes. Spleen: Normal in size without significant abnormality. Adrenals/Urinary Tract: Adrenal glands are unremarkable. Very atrophic right kidney. Unremarkable left kidney. No hydronephrosis. Bladder is unremarkable. Stomach/Bowel: Stomach is within normal limits. Appendix appears normal. No evidence of bowel wall thickening, distention, or inflammatory changes. Sigmoid diverticulosis. Vascular/Lymphatic: No significant vascular findings are present. No enlarged abdominal or pelvic lymph nodes. Reproductive: No mass or other significant abnormality. Other: No abdominal wall hernia or abnormality. No abdominopelvic  ascites. Musculoskeletal: No acute or significant osseous findings. IMPRESSION: 1. No acute noncontrast CT findings of the abdomen or pelvis to explain abdominal pain. 2.  Sigmoid diverticulosis.  No evidence of acute diverticulitis. 3.  Gallstones.  No evidence of acute cholelithiasis. 4.  Other chronic and incidental findings as detailed above. Electronically Signed   By: Eddie Candle M.D.   On: 03/28/2019 19:08   Dg Abd 1 View  Result Date: 03/29/2019 CLINICAL DATA:  Tube placement EXAM: ABDOMEN - 1 VIEW COMPARISON:  CT dated March 28, 2019 FINDINGS: The enteric tube projects over the expected region of the gastric body. The bowel gas pattern is nonspecific and nonobstructive. IMPRESSION: Enteric tube tip projects over the expected region of the gastric body. Electronically Signed   By: Constance Holster M.D.   On: 03/29/2019 02:55   Ct Head Wo Contrast  Result Date: 03/28/2019 CLINICAL DATA:  Generalized weakness x2 months. EXAM: CT HEAD WITHOUT CONTRAST TECHNIQUE: Contiguous axial images were obtained from the base of the skull through the vertex without intravenous contrast. COMPARISON:  None. FINDINGS: Brain: No evidence of acute infarction, hemorrhage, hydrocephalus, extra-axial collection or mass lesion/mass effect. Age related volume loss and chronic microvascular ischemic changes are noted. Vascular: No hyperdense vessel or unexpected calcification. Skull: Normal. Negative for fracture or focal lesion. Sinuses/Orbits: No acute finding. Other: None. IMPRESSION: 1. No acute intracranial abnormality. 2. Age related volume loss and chronic microvascular ischemic changes are noted. Electronically Signed   By: Constance Holster M.D.   On: 03/28/2019 18:54   US Renal  Result Date: 03/29/2019 CLINICAL DATA:  Acute kidney injury EXAM: RENAL / URINARY TRACT ULTRASOUND COMPLETE COMPARISON:  Abdominal CT from yesterday FINDINGS: Right Kidney: Severe atrophy with marked cortical thinning better seen by  prior CT. No hydronephrosis. Renal length is 8.8 cm. Left Kidney: Renal measurements: 12 x  6 x 7 cm = volume: 270 mL. Simple cyst measuring 2.9 cm. No hydronephrosis or abnormal echogenicity. Bladder: Decompressed completely by a Foley catheter. IMPRESSION: 1. Severe right renal atrophy. 2. Unremarkable left kidney. Electronically Signed   By: Monte Fantasia M.D.   On: 03/29/2019 05:58   Dg Chest Portable 1 View  Result Date: 03/28/2019 CLINICAL DATA:  Generalized weakness. EXAM: PORTABLE CHEST 1 VIEW COMPARISON:  Radiograph of Feb 15, 2019. FINDINGS: Stable cardiomegaly. Atherosclerosis of thoracic aorta is noted. No pneumothorax is noted. Right lung is clear. Probable small left pleural effusion is noted with associated atelectasis. Bony thorax is unremarkable. IMPRESSION: Stable cardiomegaly. Probable small left pleural effusion is noted with associated atelectasis. Aortic Atherosclerosis (ICD10-I70.0). Electronically Signed   By: Marijo Conception M.D.   On: 03/28/2019 16:35     Antimicrobials:   KEFLEX FOR NOW, PENDING HOSPICE CONSIDERATION   Subjective: Patient still remains confused, A-line placed on pressor  Objective: Vitals:   03/31/19 0400 03/31/19 0600 03/31/19 0800 03/31/19 0900  BP: (!) 98/42     Pulse: 62  66 71  Resp: 14 14 14 15   Temp:   98.2 F (36.8 C)   TempSrc:   Oral   SpO2:  92% 100% 100%  Weight:      Height:        Intake/Output Summary (Last 24 hours) at 03/31/2019 1149 Last data filed at 03/31/2019 1610 Gross per 24 hour  Intake 547.75 ml  Output 1325 ml  Net -777.25 ml   Filed Weights   03/28/19 2301 03/30/19 0500  Weight: 109.8 kg 118 kg    Examination:  General exam: Appears calm, is confused Respiratory system: Clear to auscultation. Respiratory effort normal. Cardiovascular system: S1 & S2 heard, RRR. No JVD, murmurs, rubs, gallops or clicks. No pedal edema. Gastrointestinal system: Abdomen is nondistended, soft and nontender. No  organomegaly or masses felt. Normal bowel sounds heard. Central nervous system: Alert, confused, . No new focal neurological deficits. Extremities: bilar edema, no contractures Skin: No rashes, lesions or ulcers Psychiatry: confused, tangential thought process    Data Reviewed: I have personally reviewed following labs and imaging studies  CBC: Recent Labs  Lab 03/28/19 1611 03/28/19 2315 03/29/19 0420 03/30/19 0231 03/30/19 1150 03/31/19 0244  WBC 14.2* 10.1 9.7 9.8 9.2 10.1  NEUTROABS 12.0*  --   --  7.3  --  7.0  HGB 9.4* 8.1* 8.1* 7.0* 7.4* 7.7*  HCT 31.2* 26.5*  28.1* 25.6* 22.2* 23.6* 25.8*  MCV 89.4 87.7 86.2 85.7 87.4 89.9  PLT 497* 405* 358 302 272 960   Basic Metabolic Panel: Recent Labs  Lab 03/28/19 2315 03/29/19 0420 03/29/19 0657 03/29/19 1037 03/30/19 0231 03/31/19 0244  NA 135 135 138 137 138 140  K 6.9* 5.9* 4.9 4.9 3.7 2.8*  CL 94* 93* 96* 94* 89* 93*  CO2 17* 22 22 23  32 31  GLUCOSE 252* 262* 249* 180* 161* 138*  BUN 109* 116* 115* 109* 92* 78*  CREATININE 5.32* 4.82* 4.73* 3.98* 3.99* 2.94*  CALCIUM 7.1* 6.8* 6.5* 6.5* 6.0* 6.9*  MG 2.1  --   --   --   --  1.6*  PHOS 8.6* 6.9*  --   --   --  4.5   GFR: Estimated Creatinine Clearance: 20.5 mL/min (A) (by C-G formula based on SCr of 2.94 mg/dL (H)). Liver Function Tests: Recent Labs  Lab 03/28/19 1611 03/28/19 2315 03/29/19 0420  AST 33  --  29  ALT 31  --  28  ALKPHOS 75  --  65  BILITOT 0.9  --  0.5  PROT 6.4*  --  5.1*  ALBUMIN 3.1* 2.7* 2.6*   No results for input(s): LIPASE, AMYLASE in the last 168 hours. No results for input(s): AMMONIA in the last 168 hours. Coagulation Profile: Recent Labs  Lab 03/28/19 2156  INR 2.4*   Cardiac Enzymes: No results for input(s): CKTOTAL, CKMB, CKMBINDEX, TROPONINI in the last 168 hours. BNP (last 3 results) No results for input(s): PROBNP in the last 8760 hours. HbA1C: Recent Labs    03/29/19 0420  HGBA1C 6.8*   CBG: Recent Labs   Lab 03/30/19 0800 03/30/19 1146 03/30/19 1620 03/30/19 2124 03/31/19 0751  GLUCAP 127* 128* 147* 148* 139*   Lipid Profile: No results for input(s): CHOL, HDL, LDLCALC, TRIG, CHOLHDL, LDLDIRECT in the last 72 hours. Thyroid Function Tests: No results for input(s): TSH, T4TOTAL, FREET4, T3FREE, THYROIDAB in the last 72 hours. Anemia Panel: Recent Labs    03/28/19 2315  VITAMINB12 2,337*  FERRITIN 43  TIBC 299  IRON 47   Sepsis Labs: Recent Labs  Lab 03/29/19 0420 03/29/19 0657 03/29/19 1037 03/30/19 0847  LATICACIDVEN 5.6* 5.7* 4.0* 2.0*    Recent Results (from the past 240 hour(s))  Urine culture     Status: Abnormal   Collection Time: 03/28/19  3:53 PM   Specimen: Urine, Clean Catch  Result Value Ref Range Status   Specimen Description   Final    URINE, CLEAN CATCH Performed at [facility] : [facility], [place] [address], [place],  [address]    Special Requests   Final    NONE Performed at [facility], [place] [address] [place], [place] [address]    Culture 80,000 COLONIES/mL ESCHERICHIA COLI (A)  Final   Report Status 03/30/2019 FINAL  Final   Organism ID, Bacteria ESCHERICHIA COLI (A)  Final      Susceptibility   Escherichia coli - MIC*    AMPICILLIN <=2 SENSITIVE Sensitive     CEFAZOLIN <=4 SENSITIVE Sensitive     CEFTRIAXONE <=1 SENSITIVE Sensitive     CIPROFLOXACIN <=0.25 SENSITIVE Sensitive     GENTAMICIN 2 SENSITIVE Sensitive     IMIPENEM <=0.25 SENSITIVE Sensitive     NITROFURANTOIN <=16 SENSITIVE Sensitive     TRIMETH/SULFA <=20 SENSITIVE Sensitive     AMPICILLIN/SULBACTAM <=2 SENSITIVE Sensitive     PIP/TAZO <=4 SENSITIVE Sensitive     Extended ESBL NEGATIVE Sensitive     * 80,000 COLONIES/mL ESCHERICHIA COLI  SARS Coronavirus 2 (CEPHEID - Performed in [place] hospital lab), Hosp Order     Status: None   Collection Time: 03/28/19  4:11 PM   Specimen: Nasopharyngeal Swab  Result Value Ref Range Status   SARS  Coronavirus 2 NEGATIVE NEGATIVE Final    Comment: (NOTE) If result is NEGATIVE SARS-CoV-2 target nucleic acids are NOT DETECTED. The SARS-CoV-2 RNA is generally detectable in upper and lower  respiratory specimens during the acute phase of infection. The lowest  concentration of SARS-CoV-2 viral copies this assay can detect is 250  copies / mL. A negative result does not preclude SARS-CoV-2 infection  and should not be used as the sole basis for treatment or other  patient management decisions.  A negative result may occur with  improper specimen collection / handling, submission of specimen other  than nasopharyngeal swab, presence of viral mutation(s) within the  areas targeted by this assay, and  inadequate number of viral copies  (<250 copies / mL). A negative result must be combined with clinical  observations, patient history, and epidemiological information. If result is POSITIVE SARS-CoV-2 target nucleic acids are DETECTED. The SARS-CoV-2 RNA is generally detectable in upper and lower  respiratory specimens dur ing the acute phase of infection.  Positive  results are indicative of active infection with SARS-CoV-2.  Clinical  correlation with patient history and other diagnostic information is  necessary to determine patient infection status.  Positive results do  not rule out bacterial infection or co-infection with other viruses. If result is PRESUMPTIVE POSTIVE SARS-CoV-2 nucleic acids MAY BE PRESENT.   A presumptive positive result was obtained on the submitted specimen  and confirmed on repeat testing.  While 2019 novel coronavirus  (SARS-CoV-2) nucleic acids may be present in the submitted sample  additional confirmatory testing may be necessary for epidemiological  and / or clinical management purposes  to differentiate between  SARS-CoV-2 and other Sarbecovirus currently known to infect humans.  If clinically indicated additional testing with an alternate test   methodology [phone]) is advised. The SARS-CoV-2 RNA is generally  detectable in upper and lower respiratory sp ecimens during the acute  phase of infection. The expected result is Negative. Fact Sheet for Patients:  StrictlyIdeas.no Fact Sheet for Healthcare Providers: BankingDealers.co.za This test is not yet approved or cleared by the [place] FDA and has been authorized for detection and/or diagnosis of SARS-CoV-2 by FDA under an Emergency Use Authorization (EUA).  This EUA will remain in effect (meaning this test can be used) for the duration of the COVID-19 declaration under Section 564(b)(1) of the Act, 21 U.S.C. section 360bbb-3(b)(1), unless the authorization is terminated or revoked sooner. Performed at [facility], [place] [address] [place], [place] [address]   MRSA PCR Screening     Status: None   Collection Time: 03/28/19 11:05 PM   Specimen: Nasal Mucosa; Nasopharyngeal  Result Value Ref Range Status   MRSA by PCR NEGATIVE NEGATIVE Final    Comment:        The GeneXpert MRSA Assay (FDA approved for NASAL specimens only), is one component of a comprehensive MRSA colonization surveillance program. It is not intended to diagnose MRSA infection nor to guide or monitor treatment for MRSA infections. Performed at [facility], [place] [address], [place], [place] [address]          Radiology Studies: No results found.      Scheduled Meds: . cephALEXin  500 mg Oral Daily  . Chlorhexidine Gluconate Cloth  6 each Topical Q0600  . ferrous sulfate  325 mg Oral Daily  . insulin aspart  0-5 Units Subcutaneous QHS  . insulin aspart  0-9 Units Subcutaneous TID WC  . insulin glargine  10 Units Subcutaneous Daily  . mouth rinse  15 mL Mouth Rinse BID  . predniSONE  15 mg Oral Q breakfast  . vitamin B-12  1,000 mcg Oral Daily   Continuous Infusions: . sodium chloride    .  sodium chloride    . norepinephrine (LEVOPHED) Adult infusion 4 mcg/min (03/31/19 0516)     LOS: 3 days    Time spent: 72 min     Nicolette Bang, MD [facility]  If 7PM-7AM, please contact night-coverage  03/31/2019, 11:49 AM

## 2019-03-31 NOTE — Evaluation (Signed)
Occupational Therapy Evaluation Patient Details Name: Abigail Duncan MRN: [id number] DOB: 25-Dec-1940 Today's Date: 03/31/2019    History of Present Illness 24  H/O PMR, spinal stenosis, DM , peripheral neuropathy  brought in by EMS for confusion/hallucinations.Son has  been taking care of pt.  for the last month since DC from rehab.per son, has  been unable to walk since being out of rehab due to weakness in both legs.   Clinical Impression   Pt was admitted for the above. Unsure of how much adl assistance she had at home. She does participate in self feeding and grooming and did help a little with rolling in bed.  Will follow her in as she tolerates it for the goals listed below.    Follow Up Recommendations  SNF ?hospice   Equipment Recommendations  None recommended by OT    Recommendations for Other Services       Precautions / Restrictions Precautions Precautions: Fall Restrictions Weight Bearing Restrictions: No      Mobility Bed Mobility     Rolling: Total assist;+2 for physical assistance         General bed mobility comments: pt 10%  Transfers                      Balance                                           ADL either performed or assessed with clinical judgement   ADL Overall ADL's : Needs assistance/impaired Eating/Feeding: Set up   Grooming: Minimal assistance Grooming Details (indicate cue type and reason): washed face and hands Upper Body Bathing: Maximal assistance   Lower Body Bathing: Total assistance;+2 for physical assistance   Upper Body Dressing : Maximal assistance   Lower Body Dressing: Total assistance;+2 for physical assistance                 General ADL Comments: removed mitts for self feeding and grooming.  Pt did try to help with rolling to bil side     Vision         Perception     Praxis      Pertinent Vitals/Pain Pain Assessment: Faces Faces Pain Scale: Hurts even more Pain  Location: generalized Pain Descriptors / Indicators: Discomfort;Grimacing;Moaning Pain Intervention(s): Monitored during session     Hand Dominance     Extremity/Trunk Assessment Upper Extremity Assessment Upper Extremity Assessment: Generalized weakness;LUE deficits/detail LUE Deficits / Details: edema present. Repositioned up and performed hand AROM           Communication Communication Communication: No difficulties   Cognition Arousal/Alertness: Awake/alert Behavior During Therapy: WFL for tasks assessed/performed Overall Cognitive Status: No family/caregiver present to determine baseline cognitive functioning                                 General Comments: follows one step commands   General Comments  pt unable to move legs; pillows placed under calves to float heels. Vitals stable    Exercises     Shoulder Instructions      Home Living Family/patient expects to be discharged to:: Unsure  Prior Functioning/Environment          Comments: unsure of how much adl assistance she needed. Per chart, was home with children        OT Problem List: Decreased strength;Decreased activity tolerance;Pain;Decreased range of motion(balance NT)      OT Treatment/Interventions: Self-care/ADL training;DME and/or AE instruction;Patient/family education;Therapeutic activities;Therapeutic exercise;Energy conservation(likely balance)    OT Goals(Current goals can be found in the care plan section) Acute Rehab OT Goals Patient Stated Goal: none OT Goal Formulation: With patient Time For Goal Achievement: 04/14/19 Potential to Achieve Goals: Fair ADL Goals Additional ADL Goal #1: pt will roll to bil sides for adls for adls with max +2 assistance Additional ADL Goal #2: pt will sit eob with max A x 5 minutes in preparation for adls Additional ADL Goal #3: pt will perform 10 reps AAROM to bil UEs to increase  strength for adls and decrease LUE edema  OT Frequency: Min 2X/week   Barriers to D/C:            Co-evaluation              AM-PAC OT "6 Clicks" Daily Activity     Outcome Measure Help from another person eating meals?: A Little Help from another person taking care of personal grooming?: A Little Help from another person toileting, which includes using toliet, bedpan, or urinal?: Total Help from another person bathing (including washing, rinsing, drying)?: A Lot Help from another person to put on and taking off regular upper body clothing?: A Lot Help from another person to put on and taking off regular lower body clothing?: Total 6 Click Score: 12   End of Session    Activity Tolerance: Patient tolerated treatment well Patient left: in bed;with call bell/phone within reach;with bed alarm set;with nursing/sitter in room  OT Visit Diagnosis: Muscle weakness (generalized) (M62.81)                Time: 7096-2836 OT Time Calculation (min): 22 min Charges:  OT General Charges $OT Visit: 1 Visit OT Evaluation $OT Eval Low Complexity: [place], OTR/L Acute Rehabilitation Services [phone] [facility] pager [phone] office 03/31/2019  [place] 03/31/2019, 4:16 PM

## 2019-03-31 NOTE — Patient Outreach (Signed)
[place]) Care Management  03/31/2019  Abigail Duncan 10-02-1941 [id number]   Referral received from [place], Eduard Clos.   "Voice concern regarding ability to pay bills and buy food at times. Would like some resources for assistance." Unsuccessful outreach to patient today.  Left voicemail message.  Unsuccessful outreach letter mailed.  Will attempt to reach again within four business days.  Ronn Melena, BSW Social Worker [phone]

## 2019-03-31 NOTE — Patient Outreach (Signed)
[facility]) Care Management  03/31/2019  GREIDY SHERARD [birth year] [id number]   CSW made contact with pt and confirmed her identity. CSW introduced self and role and reason for call-  Pt admits to having depression- she has felt more isolated since her rehab stay about 1.5 months ago- She has immobility and with the COVID restrictions has been more lonely. Her son lives with her and she has [place] services coming out. She plans to see her PCP on July 9th and will discuss her depression and treatment options with him at that time.  Pt agrees to [place] communicating with PCP and his office staff Casimer Lanius, LCSW) regarding the depression. Pt also agrees to a follow up call from this CSW after her PCP visit to assess and assist further.   Eduard Clos, MSW, LCSW Clinical Social Worker  [place], MSW, [place] Worker  [place] [phone]

## 2019-03-31 NOTE — Progress Notes (Signed)
Daily Progress Note   Patient Name: Abigail Duncan       Date: 03/31/2019 DOB: [birth year]  Age: 78 y.o. MRN#: [id number] Attending Physician: Marcell Anger* Primary Care Physician: McDiarmid, Blane Ohara, MD Admit Date: 03/28/2019  Reason for Consultation/Follow-up: Establishing goals of care  Subjective:  patient continues to appear weak and deconditioned. She is resting in bed, asleep, but awakens and engages some She denies pain or shortness of breath She still has low BP and is on pressors.  Call placed and discussed with son on the phone, after discussing with patient, see below.  Length of Stay: 3  Current Medications: Scheduled Meds:  . cephALEXin  500 mg Oral Daily  . Chlorhexidine Gluconate Cloth  6 each Topical Q0600  . ferrous sulfate  325 mg Oral Daily  . insulin aspart  0-5 Units Subcutaneous QHS  . insulin aspart  0-9 Units Subcutaneous TID WC  . insulin glargine  10 Units Subcutaneous Daily  . mouth rinse  15 mL Mouth Rinse BID  . predniSONE  15 mg Oral Q breakfast  . vitamin B-12  1,000 mcg Oral Daily    Continuous Infusions: . sodium chloride    . sodium chloride    . norepinephrine (LEVOPHED) Adult infusion 4 mcg/min (03/31/19 0516)    PRN Meds: Place/Maintain arterial line **AND** sodium chloride, acetaminophen **OR** acetaminophen, albuterol, bisacodyl, fluticasone, magnesium citrate, nitroGLYCERIN, ondansetron **OR** ondansetron (ZOFRAN) IV, oxyCODONE, senna-docusate  Physical Exam         Awakens and engages some Hasn't eaten breakfast yet, complains of weakness, not able to feed self Abdomen protuberant Diminished breath sounds S1 S2 Has edema both LE Some what more alert  Vital Signs: BP (!) 98/42 (BP Location: Right Wrist) Comment (BP  Location): art line  Pulse 71   Temp 98.2 F (36.8 C) (Oral)   Resp 15   Ht 5\' 3"  (1.6 m)   Wt 118 kg   LMP  (LMP Unknown)   SpO2 100%   BMI 46.08 kg/m  SpO2: SpO2: 100 % O2 Device: O2 Device: Nasal Cannula O2 Flow Rate: O2 Flow Rate (L/min): 1 L/min  Intake/output summary:   Intake/Output Summary (Last 24 hours) at 03/31/2019 1041 Last data filed at 03/31/2019 0623 Gross per 24 hour  Intake 547.75 ml  Output 1325 ml  Net -777.25 ml   LBM: Last BM Date: 03/29/19 Baseline Weight: Weight: 109.8 kg Most recent weight: Weight: 118 kg       Palliative Assessment/Data:      Patient Active Problem List   Diagnosis Date Noted  . Shock circulatory ([place]) 03/30/2019  . Toxic metabolic encephalopathy 63/87/5643  . Pressure injury of skin 03/30/2019  . Acute renal failure ([place])   . Palliative care by specialist   . AKI (acute kidney injury) ([place]) 03/28/2019  . Generalized pain 03/24/2019  . Hypoalbuminemia 03/24/2019  . Diabetes mellitus with proteinuria ([place]) 03/23/2019  . Bilateral primary osteoarthritis of knee   . Polymyalgia rheumatica ([place])   . Arthralgia of multiple sites   . Generalized weakness 02/15/2019  . Iron deficiency anemia 10/28/2018  . Vitamin B12 deficiency 10/28/2018  . Thrombocytosis ([place]) 10/28/2018  . Chronic pain syndrome 07/15/2018  . Stage 3b chronic kidney disease ([place]) 07/15/2018  . Gait difficulty 07/15/2018  . Atrophic kidney, Right 05/20/2018  . Obstructive sleep apnea 05/19/2018    Class: Chronic  . Normocytic anemia 05/19/2018  . Diabetic retinopathy ([place]) 03/11/2018  . Chronic painful diabetic neuropathy ([place]) 09/10/2017  . Secondary hyperparathyroidism ([place]) 05/31/2017  . Anemia of chronic disease 05/31/2017  . Impaired mobility 05/21/2017  . Vitamin D deficiency 01/25/2017  . Restless leg syndrome 09/19/2015  . Pulmonary hypertension ([place]) 06/14/2015  . Osteopenia 05/09/2015  . Hypertension associated with chronic kidney  disease due to type 2 diabetes mellitus ([place]) 04/01/2015  . Goals of care, counseling/discussion 12/18/2014  . Preventative health care 01/26/2014  . Long term current use of anticoagulant therapy 11/15/2010  . Atrial fibrillation, permanent 01/22/2009  . ALLERGIC RHINITIS, SEASONAL 01/08/2009  . Type II diabetes mellitus with neurological manifestations ([place]) 12/02/2006  . Pure hypercholesterolemia 12/02/2006  . Morbid obesity ([place]) with obstructive sleep apnea 12/02/2006  . Coronary atherosclerosis 12/02/2006  . Osteoarthritis of multiple joints 12/02/2006  . DDD (degenerative disc disease), lumbar 12/02/2006  . INCONTINENCE, URGE 12/02/2006    Palliative Care Assessment & Plan   Patient Profile:    Assessment:  AKI CKD Renal atrophy on ultrasound Low BP Confusion Weakness Functional decline Recent rehab stay after UTI hospitalization Has A fib DM chronic UTI neuropathy Poor PO intake  Recommendations/Plan: I re discussed goals of care with both patient at bedside, also gave her son a call and re discussed with him as well.  I discussed frankly but compassionately with the patient that she has acute and underlying conditions that place her at high risk for ongoing decline decompensation and even death in the near future. We talked about different modes of care: SNF rehab versus hospice facility. Patient doesn't want to go to SNF again. I gave her more information about hospice philosophy of care and end of life care. Patient simply states, "if it happens it happens."  Call placed and discussed with son, he states, "rehab didn't do nothing for her. I can't lift her or take care of her at home." patient's son is asking about insurance and co payment information for a residential hospice.   I discussed with both of them about the patient's current condition, renal indices with marginal improvement, but overall she still has edema, clinically still with ongoing decline, BP not  stable, still pressor dependent at this time, not eating much, immobile. All of these point to limited prognosis. Patient might be most benefited by going to residential hospice for continuation of comfort measures, in my opinion.  Will request CSW assistance to further coordinate residential hospice with patient and son.  Continue current mode of care for now.   PPS 30%   Code Status:    Code Status Orders  (From admission, onward)         Start     Ordered   03/30/19 1140  Do not attempt resuscitation (DNR)  Continuous    Question Answer Comment  In the event of cardiac or respiratory ARREST Do not call a "code blue"   In the event of cardiac or respiratory ARREST Do not perform Intubation, CPR, defibrillation or ACLS   In the event of cardiac or respiratory ARREST Use medication by any route, position, wound care, and other measures to relive pain and suffering. May use oxygen, suction and manual treatment of airway obstruction as needed for comfort.   Comments discussed with son who confirmed mother "never wanted to be put on life support machines"      03/30/19 1141        Code Status History    Date Active Date Inactive Code Status Order ID Comments User Context   03/28/2019 2304 03/30/2019 1141 Full Code [id number]  Harvie Bridge, DO Inpatient   02/15/2019 1836 02/22/2019 0300 Partial Code [id number]  Kathrene Alu, MD ED   Advance Care Planning Activity       Prognosis:   < 2 weeks  Discharge Planning:  Hospice facility  Care plan was discussed with  Patient, son on the phone.   Thank you for allowing the Palliative Medicine Team to assist in the care of this patient.   Time In: 10 Time Out: 10.35 Total Time 35 Prolonged Time Billed  no       Greater than 50%  of this time was spent counseling and coordinating care related to the above assessment and plan.  Loistine Chance, MD [phone] Please contact Palliative Medicine Team phone at [phone] for  questions and concerns.

## 2019-04-01 LAB — CBC WITH DIFFERENTIAL/PLATELET
Abs Immature Granulocytes: 0.09 10*3/uL — ABNORMAL HIGH (ref 0.00–0.07)
Basophils Absolute: 0 10*3/uL (ref 0.0–0.1)
Basophils Relative: 0 %
Eosinophils Absolute: 0.1 10*3/uL (ref 0.0–0.5)
Eosinophils Relative: 1 %
HCT: 23.9 % — ABNORMAL LOW (ref 36.0–46.0)
Hemoglobin: 7 g/dL — ABNORMAL LOW (ref 12.0–15.0)
Immature Granulocytes: 1 %
Lymphocytes Relative: 19 %
Lymphs Abs: 1.5 10*3/uL (ref 0.7–4.0)
MCH: 26.4 pg (ref 26.0–34.0)
MCHC: 29.3 g/dL — ABNORMAL LOW (ref 30.0–36.0)
MCV: 90.2 fL (ref 80.0–100.0)
Monocytes Absolute: 0.6 10*3/uL (ref 0.1–1.0)
Monocytes Relative: 7 %
Neutro Abs: 5.6 10*3/uL (ref 1.7–7.7)
Neutrophils Relative %: 72 %
Platelets: 287 10*3/uL (ref 150–400)
RBC: 2.65 MIL/uL — ABNORMAL LOW (ref 3.87–5.11)
RDW: 18.1 % — ABNORMAL HIGH (ref 11.5–15.5)
WBC: 7.8 10*3/uL (ref 4.0–10.5)
nRBC: 0.5 % — ABNORMAL HIGH (ref 0.0–0.2)

## 2019-04-01 LAB — GLUCOSE, CAPILLARY
Glucose-Capillary: 97 mg/dL (ref 70–99)
Glucose-Capillary: 147 mg/dL — ABNORMAL HIGH (ref 70–99)
Glucose-Capillary: 158 mg/dL — ABNORMAL HIGH (ref 70–99)
Glucose-Capillary: 111 mg/dL — ABNORMAL HIGH (ref 70–99)

## 2019-04-01 LAB — BASIC METABOLIC PANEL
Anion gap: 11 (ref 5–15)
BUN: 56 mg/dL — ABNORMAL HIGH (ref 8–23)
CO2: 32 mmol/L (ref 22–32)
Calcium: 7.2 mg/dL — ABNORMAL LOW (ref 8.9–10.3)
Chloride: 98 mmol/L (ref 98–111)
Creatinine, Ser: 2 mg/dL — ABNORMAL HIGH (ref 0.44–1.00)
GFR calc Af Amer: 28 mL/min — ABNORMAL LOW (ref >60)
GFR calc non Af Amer: 24 mL/min — ABNORMAL LOW (ref >60)
Glucose, Bld: 112 mg/dL — ABNORMAL HIGH (ref 70–99)
Potassium: 2.6 mmol/L — CL (ref 3.5–5.1)
Sodium: 141 mmol/L (ref 135–145)

## 2019-04-01 LAB — POTASSIUM: Potassium: 3.1 mmol/L — ABNORMAL LOW (ref 3.5–5.1)

## 2019-04-01 MED ORDER — POTASSIUM CHLORIDE CRYS ER 20 MEQ PO TBCR
40.0000 meq | EXTENDED_RELEASE_TABLET | Freq: Two times a day (BID) | ORAL | Status: AC
  Administered 2019-04-01 (×2): 40 meq via ORAL
  Filled 2019-04-01 (×2): qty 2

## 2019-04-01 NOTE — Progress Notes (Signed)
PROGRESS NOTE    Abigail Duncan  QQI:[id number] DOB: 13-Aug-1941 DOA: 03/28/2019 PCP: McDiarmid, Blane Ohara, MD   Brief Narrative:  Viscomi a 78 y.o.femalewith a known history of diabetes, peripheral neuropathy obstructive sleep apnea, spinal stenosis, osteoarthritis, chronic pain syndrome, atrial fibrillation onCoumadinpresents to the emergency department for evaluation ofweakness and altered mental status. Patient was hospitalized for urinary tract infection and subsequently went to rehab. She has been home for the past month however per report she has been having increasing difficulty with weakness, difficulty with ambulation and activities of daily living. Per the son the patient has been having worsening confusion over the last week and hallucinations over the last few days..  On questioning patientcomplains only of weakness.Patient denies fevers/chills, weakness, dizziness, chest pain, shortness of breath, N/V/C/D, abdominal pain, dysuria/frequency, changes in mental status.  Admitting MD discussed with her at length her wishes regarding her medical treatment and advanced directives. She stated that she would consider dialysis if she really needed it but would like to speak with the nephrologist prior to making decision.   I HAD ADDITIONAL CONVERSATION WITH HER SON AS DID PALLIATIVE CARE BECAUSE OF THE PERSISTENT CONFUSION/DECLINE AND HE CLARIFIED,SheDid notwant to have CPR orintubation, She "didn't want to be on any machines" Patient was made DNR   Assessment & Plan:   Active Problems:   Type II diabetes mellitus with neurological manifestations ([place])   Pure hypercholesterolemia   Morbid obesity (HCC) with obstructive sleep apnea   Atrial fibrillation, permanent   Goals of care, counseling/discussion   Anemia of chronic disease   AKI (acute kidney injury) ([place])   Shock circulatory (HCC)   Toxic metabolic encephalopathy   Acute renal failure ([place])  Palliative care by specialist   Pressure injury of skin   Acute kidney injury with hyperkalemia. Patient with progressive decline, nephrology following, not a dialysis candidate, Has been weaned off pressors renal function is actually improving.  Normocytic anemia: Patient is heme-negative B12 greater than 2000, hemoglobin7 Continue to monitor, continue patient's home iron.  History of acute  UTI with SEVERE SEPSIS -Continue Keflexfor now, urine cultureshows E. coli, normal white count -Weaned off pressors-meeting with palliative with hospice consideration pending -will not broaden out abx at this point given the discussion of hospice care as well as clinical improvement.  History ofatrial fibrillation -Holding blood pressure meds in the setting of hypotension  History ofdiabetes - ContinueLantus, Accu-Cheks with regular insulin sliding scale coverage -No changes  History ofneuropathy - Continuegabapentin  Overall patient is a poor prognosis. Patient is now DNR, palliative care consult appreciated, further discussions regarding hospice care pending further discussions with son and patient  DVT prophylaxis: SCD/Compression stockings  Code Status: DNR    Code Status Orders  (From admission, onward)         Start     Ordered   03/30/19 1140  Do not attempt resuscitation (DNR)  Continuous    Question Answer Comment  In the event of cardiac or respiratory ARREST Do not call a "code blue"   In the event of cardiac or respiratory ARREST Do not perform Intubation, CPR, defibrillation or ACLS   In the event of cardiac or respiratory ARREST Use medication by any route, position, wound care, and other measures to relive pain and suffering. May use oxygen, suction and manual treatment of airway obstruction as needed for comfort.   Comments discussed with son who confirmed mother "never wanted to be put on life support machines"  03/30/19 1141        Code  Status History    Date Active Date Inactive Code Status Order ID Comments User Context   03/28/2019 2304 03/30/2019 1141 Full Code [id number]  Harvie Bridge, DO Inpatient   02/15/2019 1836 02/22/2019 0300 Partial Code [id number]  Kathrene Alu, MD ED   Advance Care Planning Activity     Family Communication: Spoke with son Disposition Plan:    Patient remained inpatient with critical illness requiring IV pressors, fluids, subspecialty support. Because of patient's poor prognosis palliative care consult in progress with possible transition to hospice, without current treatments at high risk of severe life-threatening decompensation Consults called: PCCM, PALLIATIVE Admission status: Inpatient   Consultants:   PCCM, PALLIATIVE  Procedures:  Ct Abdomen Pelvis Wo Contrast  Result Date: 03/28/2019 CLINICAL DATA:  Generalized weakness, generalized body pain, diverticulitis suspected EXAM: CT ABDOMEN AND PELVIS WITHOUT CONTRAST TECHNIQUE: Multidetector CT imaging of the abdomen and pelvis was performed following the standard protocol without IV contrast. COMPARISON:  None. FINDINGS: Lower chest: Cardiomegaly. Extensive 3 vessel coronary artery calcifications. Hepatobiliary: No solid liver abnormality is seen. Gallstones. No gallbladder wall thickening, or biliary dilatation. Pancreas: Unremarkable. No pancreatic ductal dilatation or surrounding inflammatory changes. Spleen: Normal in size without significant abnormality. Adrenals/Urinary Tract: Adrenal glands are unremarkable. Very atrophic right kidney. Unremarkable left kidney. No hydronephrosis. Bladder is unremarkable. Stomach/Bowel: Stomach is within normal limits. Appendix appears normal. No evidence of bowel wall thickening, distention, or inflammatory changes. Sigmoid diverticulosis. Vascular/Lymphatic: No significant vascular findings are present. No enlarged abdominal or pelvic lymph nodes. Reproductive: No mass or other significant  abnormality. Other: No abdominal wall hernia or abnormality. No abdominopelvic ascites. Musculoskeletal: No acute or significant osseous findings. IMPRESSION: 1. No acute noncontrast CT findings of the abdomen or pelvis to explain abdominal pain. 2.  Sigmoid diverticulosis.  No evidence of acute diverticulitis. 3.  Gallstones.  No evidence of acute cholelithiasis. 4.  Other chronic and incidental findings as detailed above. Electronically Signed   By: Eddie Candle M.D.   On: 03/28/2019 19:08   Dg Abd 1 View  Result Date: 03/29/2019 CLINICAL DATA:  Tube placement EXAM: ABDOMEN - 1 VIEW COMPARISON:  CT dated March 28, 2019 FINDINGS: The enteric tube projects over the expected region of the gastric body. The bowel gas pattern is nonspecific and nonobstructive. IMPRESSION: Enteric tube tip projects over the expected region of the gastric body. Electronically Signed   By: Constance Holster M.D.   On: 03/29/2019 02:55   Ct Head Wo Contrast  Result Date: 03/28/2019 CLINICAL DATA:  Generalized weakness x2 months. EXAM: CT HEAD WITHOUT CONTRAST TECHNIQUE: Contiguous axial images were obtained from the base of the skull through the vertex without intravenous contrast. COMPARISON:  None. FINDINGS: Brain: No evidence of acute infarction, hemorrhage, hydrocephalus, extra-axial collection or mass lesion/mass effect. Age related volume loss and chronic microvascular ischemic changes are noted. Vascular: No hyperdense vessel or unexpected calcification. Skull: Normal. Negative for fracture or focal lesion. Sinuses/Orbits: No acute finding. Other: None. IMPRESSION: 1. No acute intracranial abnormality. 2. Age related volume loss and chronic microvascular ischemic changes are noted. Electronically Signed   By: Constance Holster M.D.   On: 03/28/2019 18:54   US Renal  Result Date: 03/29/2019 CLINICAL DATA:  Acute kidney injury EXAM: RENAL / URINARY TRACT ULTRASOUND COMPLETE COMPARISON:  Abdominal CT from yesterday  FINDINGS: Right Kidney: Severe atrophy with marked cortical thinning better seen by prior CT. No hydronephrosis. Renal length is  8.8 cm. Left Kidney: Renal measurements: 12 x 6 x 7 cm = volume: 270 mL. Simple cyst measuring 2.9 cm. No hydronephrosis or abnormal echogenicity. Bladder: Decompressed completely by a Foley catheter. IMPRESSION: 1. Severe right renal atrophy. 2. Unremarkable left kidney. Electronically Signed   By: Monte Fantasia M.D.   On: 03/29/2019 05:58   Dg Chest Portable 1 View  Result Date: 03/28/2019 CLINICAL DATA:  Generalized weakness. EXAM: PORTABLE CHEST 1 VIEW COMPARISON:  Radiograph of Feb 15, 2019. FINDINGS: Stable cardiomegaly. Atherosclerosis of thoracic aorta is noted. No pneumothorax is noted. Right lung is clear. Probable small left pleural effusion is noted with associated atelectasis. Bony thorax is unremarkable. IMPRESSION: Stable cardiomegaly. Probable small left pleural effusion is noted with associated atelectasis. Aortic Atherosclerosis (ICD10-I70.0). Electronically Signed   By: Marijo Conception M.D.   On: 03/28/2019 16:35     Antimicrobials:   KEFLEX    Subjective: CONFUSED BUT IMPROVING  Objective: Vitals:   04/01/19 1000 04/01/19 1100 04/01/19 1110 04/01/19 1200  BP:      Pulse: 87 82 89 76  Resp: 14 18 16 15   Temp:      TempSrc:      SpO2: 92% 94% 95% 96%  Weight:      Height:        Intake/Output Summary (Last 24 hours) at 04/01/2019 1245 Last data filed at 04/01/2019 0900 Gross per 24 hour  Intake 413.46 ml  Output 1425 ml  Net -1011.54 ml   Filed Weights   03/28/19 2301 03/30/19 0500  Weight: 109.8 kg 118 kg    Examination:  General exam: Appears calm, is confused Respiratory system: Clear to auscultation. Respiratory effort normal. Cardiovascular system: S1 & S2 heard, RRR. No JVD, murmurs, rubs, gallops or clicks. No pedal edema. Gastrointestinal system: Abdomen is nondistended, soft and nontender. No organomegaly or masses  felt. Normal bowel sounds heard. Central nervous system: Alert, confused, . No new focal neurological deficits. Extremities: bilar edema, no contractures Skin: No rashes, lesions or ulcers Psychiatry: confused, tangential thought process     Data Reviewed: I have personally reviewed following labs and imaging studies  CBC: Recent Labs  Lab 03/28/19 1611  03/29/19 0420 03/30/19 0231 03/30/19 1150 03/31/19 0244 04/01/19 0415  WBC 14.2*   < > 9.7 9.8 9.2 10.1 7.8  NEUTROABS 12.0*  --   --  7.3  --  7.0 5.6  HGB 9.4*   < > 8.1* 7.0* 7.4* 7.7* 7.0*  HCT 31.2*   < > 25.6* 22.2* 23.6* 25.8* 23.9*  MCV 89.4   < > 86.2 85.7 87.4 89.9 90.2  PLT 497*   < > 358 302 272 324 287   < > = values in this interval not displayed.   Basic Metabolic Panel: Recent Labs  Lab 03/28/19 2315 03/29/19 0420 03/29/19 0657 03/29/19 1037 03/30/19 0231 03/31/19 0244 04/01/19 0415  NA 135 135 138 137 138 140 141  K 6.9* 5.9* 4.9 4.9 3.7 2.8* 2.6*  CL 94* 93* 96* 94* 89* 93* 98  CO2 17* 22 22 23  32 31 32  GLUCOSE 252* 262* 249* 180* 161* 138* 112*  BUN 109* 116* 115* 109* 92* 78* 56*  CREATININE 5.32* 4.82* 4.73* 3.98* 3.99* 2.94* 2.00*  CALCIUM 7.1* 6.8* 6.5* 6.5* 6.0* 6.9* 7.2*  MG 2.1  --   --   --   --  1.6*  --   PHOS 8.6* 6.9*  --   --   --  4.5  --    GFR: Estimated Creatinine Clearance: 30.2 mL/min (A) (by C-G formula based on SCr of 2 mg/dL (H)). Liver Function Tests: Recent Labs  Lab 03/28/19 1611 03/28/19 2315 03/29/19 0420  AST 33  --  29  ALT 31  --  28  ALKPHOS 75  --  65  BILITOT 0.9  --  0.5  PROT 6.4*  --  5.1*  ALBUMIN 3.1* 2.7* 2.6*   No results for input(s): LIPASE, AMYLASE in the last 168 hours. No results for input(s): AMMONIA in the last 168 hours. Coagulation Profile: Recent Labs  Lab 03/28/19 2156  INR 2.4*   Cardiac Enzymes: No results for input(s): CKTOTAL, CKMB, CKMBINDEX, TROPONINI in the last 168 hours. BNP (last 3 results) No results for  input(s): PROBNP in the last 8760 hours. HbA1C: No results for input(s): HGBA1C in the last 72 hours. CBG: Recent Labs  Lab 03/31/19 1218 03/31/19 1654 03/31/19 2117 04/01/19 0802 04/01/19 1233  GLUCAP 137* 202* 144* 97 147*   Lipid Profile: No results for input(s): CHOL, HDL, LDLCALC, TRIG, CHOLHDL, LDLDIRECT in the last 72 hours. Thyroid Function Tests: No results for input(s): TSH, T4TOTAL, FREET4, T3FREE, THYROIDAB in the last 72 hours. Anemia Panel: No results for input(s): VITAMINB12, FOLATE, FERRITIN, TIBC, IRON, RETICCTPCT in the last 72 hours. Sepsis Labs: Recent Labs  Lab 03/29/19 0420 03/29/19 0657 03/29/19 1037 03/30/19 0847  LATICACIDVEN 5.6* 5.7* 4.0* 2.0*    Recent Results (from the past 240 hour(s))  Urine culture     Status: Abnormal   Collection Time: 03/28/19  3:53 PM   Specimen: Urine, Clean Catch  Result Value Ref Range Status   Specimen Description   Final    URINE, CLEAN CATCH Performed at [facility], [place] [address] [place], [place] [address]    Special Requests   Final    NONE Performed at [facility], [place] [address], [place], [place] [address]    Culture 80,000 COLONIES/mL ESCHERICHIA COLI (A)  Final   Report Status 03/30/2019 FINAL  Final   Organism ID, Bacteria ESCHERICHIA COLI (A)  Final      Susceptibility   Escherichia coli - MIC*    AMPICILLIN <=2 SENSITIVE Sensitive     CEFAZOLIN <=4 SENSITIVE Sensitive     CEFTRIAXONE <=1 SENSITIVE Sensitive     CIPROFLOXACIN <=0.25 SENSITIVE Sensitive     GENTAMICIN 2 SENSITIVE Sensitive     IMIPENEM <=0.25 SENSITIVE Sensitive     NITROFURANTOIN <=16 SENSITIVE Sensitive     TRIMETH/SULFA <=20 SENSITIVE Sensitive     AMPICILLIN/SULBACTAM <=2 SENSITIVE Sensitive     PIP/TAZO <=4 SENSITIVE Sensitive     Extended ESBL NEGATIVE Sensitive     * 80,000 COLONIES/mL ESCHERICHIA COLI  SARS Coronavirus 2 (CEPHEID - Performed in [place] hospital lab), Hosp  Order     Status: None   Collection Time: 03/28/19  4:11 PM   Specimen: Nasopharyngeal Swab  Result Value Ref Range Status   SARS Coronavirus 2 NEGATIVE NEGATIVE Final    Comment: (NOTE) If result is NEGATIVE SARS-CoV-2 target nucleic acids are NOT DETECTED. The SARS-CoV-2 RNA is generally detectable in upper and lower  respiratory specimens during the acute phase of infection. The lowest  concentration of SARS-CoV-2 viral copies this assay can detect is 250  copies / mL. A negative result does not preclude SARS-CoV-2 infection  and should not be used as the sole basis for treatment or other  patient  management decisions.  A negative result may occur with  improper specimen collection / handling, submission of specimen other  than nasopharyngeal swab, presence of viral mutation(s) within the  areas targeted by this assay, and inadequate number of viral copies  (<250 copies / mL). A negative result must be combined with clinical  observations, patient history, and epidemiological information. If result is POSITIVE SARS-CoV-2 target nucleic acids are DETECTED. The SARS-CoV-2 RNA is generally detectable in upper and lower  respiratory specimens dur ing the acute phase of infection.  Positive  results are indicative of active infection with SARS-CoV-2.  Clinical  correlation with patient history and other diagnostic information is  necessary to determine patient infection status.  Positive results do  not rule out bacterial infection or co-infection with other viruses. If result is PRESUMPTIVE POSTIVE SARS-CoV-2 nucleic acids MAY BE PRESENT.   A presumptive positive result was obtained on the submitted specimen  and confirmed on repeat testing.  While 2019 novel coronavirus  (SARS-CoV-2) nucleic acids may be present in the submitted sample  additional confirmatory testing may be necessary for epidemiological  and / or clinical management purposes  to differentiate between  SARS-CoV-2  and other Sarbecovirus currently known to infect humans.  If clinically indicated additional testing with an alternate test  methodology [phone]) is advised. The SARS-CoV-2 RNA is generally  detectable in upper and lower respiratory sp ecimens during the acute  phase of infection. The expected result is Negative. Fact Sheet for Patients:  StrictlyIdeas.no Fact Sheet for Healthcare Providers: BankingDealers.co.za This test is not yet approved or cleared by the [place] FDA and has been authorized for detection and/or diagnosis of SARS-CoV-2 by FDA under an Emergency Use Authorization (EUA).  This EUA will remain in effect (meaning this test can be used) for the duration of the COVID-19 declaration under Section 564(b)(1) of the Act, 21 U.S.C. section 360bbb-3(b)(1), unless the authorization is terminated or revoked sooner. Performed at [facility], [place] [address], [place], [place] [address]   MRSA PCR Screening     Status: None   Collection Time: 03/28/19 11:05 PM   Specimen: Nasal Mucosa; Nasopharyngeal  Result Value Ref Range Status   MRSA by PCR NEGATIVE NEGATIVE Final    Comment:        The GeneXpert MRSA Assay (FDA approved for NASAL specimens only), is one component of a comprehensive MRSA colonization surveillance program. It is not intended to diagnose MRSA infection nor to guide or monitor treatment for MRSA infections. Performed at [facility], [place] [address], [place], [place] [address]          Radiology Studies: No results found.      Scheduled Meds: . cephALEXin  500 mg Oral Daily  . Chlorhexidine Gluconate Cloth  6 each Topical Q0600  . ferrous sulfate  325 mg Oral Daily  . insulin aspart  0-5 Units Subcutaneous QHS  . insulin aspart  0-9 Units Subcutaneous TID WC  . insulin glargine  10 Units Subcutaneous Daily  . mouth rinse  15 mL Mouth Rinse BID  .  potassium chloride  40 mEq Oral BID  . predniSONE  15 mg Oral Q breakfast  . vitamin B-12  1,000 mcg Oral Daily   Continuous Infusions: . sodium chloride    . sodium chloride    . norepinephrine (LEVOPHED) Adult infusion Stopped (04/01/19 0801)     LOS: 4 days    Time spent: 35 min  Nicolette Bang, MD [facility]  If 7PM-7AM, please contact night-coverage  04/01/2019, 12:45 PM

## 2019-04-01 NOTE — Progress Notes (Signed)
Daily Progress Note   Patient Name: Abigail Duncan       Date: 04/01/2019 DOB: Feb 24, 1941  Age: 78 y.o. MRN#: [id number] Attending Physician: Marcell Anger* Primary Care Physician: McDiarmid, Blane Ohara, MD Admit Date: 03/28/2019  Reason for Consultation/Follow-up: Establishing goals of care  Subjective:  patient continues to appear weak and deconditioned. She is sitting up in bed,   She denies pain or shortness of breath Now off pressors  Patient requires complete assistance with all ADLs, nursing staff feeding her fruit cup, patient spits out most of it after eating.   Length of Stay: 4  Current Medications: Scheduled Meds:  . cephALEXin  500 mg Oral Daily  . Chlorhexidine Gluconate Cloth  6 each Topical Q0600  . ferrous sulfate  325 mg Oral Daily  . insulin aspart  0-5 Units Subcutaneous QHS  . insulin aspart  0-9 Units Subcutaneous TID WC  . insulin glargine  10 Units Subcutaneous Daily  . mouth rinse  15 mL Mouth Rinse BID  . potassium chloride  40 mEq Oral BID  . predniSONE  15 mg Oral Q breakfast  . vitamin B-12  1,000 mcg Oral Daily    Continuous Infusions: . sodium chloride    . sodium chloride    . norepinephrine (LEVOPHED) Adult infusion Stopped (04/01/19 0801)    PRN Meds: Place/Maintain arterial line **AND** sodium chloride, acetaminophen **OR** acetaminophen, albuterol, bisacodyl, fluticasone, magnesium citrate, nitroGLYCERIN, ondansetron **OR** ondansetron (ZOFRAN) IV, oxyCODONE, senna-docusate  Physical Exam         Awake, reasonably alert  not able to feed self Abdomen protuberant Diminished breath sounds S1 S2 Has edema both LE Some what more alert  Vital Signs: BP (!) 117/46   Pulse 86   Temp 98 F (36.7 C) (Oral)   Resp 19   Ht 5\' 3"   (1.6 m)   Wt 118 kg   LMP  (LMP Unknown)   SpO2 97%   BMI 46.08 kg/m  SpO2: SpO2: 97 % O2 Device: O2 Device: Room Air O2 Flow Rate: O2 Flow Rate (L/min): 1 L/min  Intake/output summary:   Intake/Output Summary (Last 24 hours) at 04/01/2019 1357 Last data filed at 04/01/2019 0900 Gross per 24 hour  Intake 413.46 ml  Output 1425 ml  Net -1011.54 ml   LBM: Last BM  Date: 03/30/19 Baseline Weight: Weight: 109.8 kg Most recent weight: Weight: 118 kg       Palliative Assessment/Data:      Patient Active Problem List   Diagnosis Date Noted  . Shock circulatory ([place]) 03/30/2019  . Toxic metabolic encephalopathy 40/98/1191  . Pressure injury of skin 03/30/2019  . Acute renal failure ([place])   . Palliative care by specialist   . AKI (acute kidney injury) ([place]) 03/28/2019  . Generalized pain 03/24/2019  . Hypoalbuminemia 03/24/2019  . Diabetes mellitus with proteinuria ([place]) 03/23/2019  . Bilateral primary osteoarthritis of knee   . Polymyalgia rheumatica ([place])   . Arthralgia of multiple sites   . Generalized weakness 02/15/2019  . Iron deficiency anemia 10/28/2018  . Vitamin B12 deficiency 10/28/2018  . Thrombocytosis ([place]) 10/28/2018  . Chronic pain syndrome 07/15/2018  . Stage 3b chronic kidney disease ([place]) 07/15/2018  . Gait difficulty 07/15/2018  . Atrophic kidney, Right 05/20/2018  . Obstructive sleep apnea 05/19/2018    Class: Chronic  . Normocytic anemia 05/19/2018  . Diabetic retinopathy ([place]) 03/11/2018  . Chronic painful diabetic neuropathy ([place]) 09/10/2017  . Secondary hyperparathyroidism ([place]) 05/31/2017  . Anemia of chronic disease 05/31/2017  . Impaired mobility 05/21/2017  . Vitamin D deficiency 01/25/2017  . Restless leg syndrome 09/19/2015  . Pulmonary hypertension ([place]) 06/14/2015  . Osteopenia 05/09/2015  . Hypertension associated with chronic kidney disease due to type 2 diabetes mellitus ([place]) 04/01/2015  . Goals of care, counseling/discussion  12/18/2014  . Preventative health care 01/26/2014  . Long term current use of anticoagulant therapy 11/15/2010  . Atrial fibrillation, permanent 01/22/2009  . ALLERGIC RHINITIS, SEASONAL 01/08/2009  . Type II diabetes mellitus with neurological manifestations ([place]) 12/02/2006  . Pure hypercholesterolemia 12/02/2006  . Morbid obesity ([place]) with obstructive sleep apnea 12/02/2006  . Coronary atherosclerosis 12/02/2006  . Osteoarthritis of multiple joints 12/02/2006  . DDD (degenerative disc disease), lumbar 12/02/2006  . INCONTINENCE, URGE 12/02/2006    Palliative Care Assessment & Plan   Patient Profile:    Assessment:  AKI CKD Renal atrophy on ultrasound Low BP Confusion Weakness Functional decline Recent rehab stay after UTI hospitalization Has A fib DM chronic UTI neuropathy Poor PO intake  Recommendations/Plan:  continue current mode of care Ongoing consideration for residential hospice towards the end of this hospitalization.   CSW assistance has been requested to further coordinate residential hospice with patient and son.  Continue current mode of care for now.   PPS 30%   Code Status:    Code Status Orders  (From admission, onward)         Start     Ordered   03/30/19 1140  Do not attempt resuscitation (DNR)  Continuous    Question Answer Comment  In the event of cardiac or respiratory ARREST Do not call a "code blue"   In the event of cardiac or respiratory ARREST Do not perform Intubation, CPR, defibrillation or ACLS   In the event of cardiac or respiratory ARREST Use medication by any route, position, wound care, and other measures to relive pain and suffering. May use oxygen, suction and manual treatment of airway obstruction as needed for comfort.   Comments discussed with son who confirmed mother "never wanted to be put on life support machines"      03/30/19 1141        Code Status History    Date Active Date Inactive Code Status Order ID  Comments User Context   03/28/2019 2304  03/30/2019 1141 Full Code [id number]  Harvie Bridge, DO Inpatient   02/15/2019 1836 02/22/2019 0300 Partial Code [id number]  Kathrene Alu, MD ED   Advance Care Planning Activity       Prognosis:   < 2 weeks  Discharge Planning:  Hospice facility  Care plan was discussed with  Patient, RN Thank you for allowing the Palliative Medicine Team to assist in the care of this patient.   Time In: 10 Time Out: 10.25 Total Time 25 Prolonged Time Billed  no       Greater than 50%  of this time was spent counseling and coordinating care related to the above assessment and plan.  Loistine Chance, MD [phone] Please contact Palliative Medicine Team phone at [phone] for questions and concerns.

## 2019-04-02 LAB — CBC WITH DIFFERENTIAL/PLATELET
Abs Immature Granulocytes: 0.11 10*3/uL — ABNORMAL HIGH (ref 0.00–0.07)
Basophils Absolute: 0 10*3/uL (ref 0.0–0.1)
Basophils Relative: 0 %
Eosinophils Absolute: 0.1 10*3/uL (ref 0.0–0.5)
Eosinophils Relative: 1 %
HCT: 24.5 % — ABNORMAL LOW (ref 36.0–46.0)
Hemoglobin: 7.2 g/dL — ABNORMAL LOW (ref 12.0–15.0)
Immature Granulocytes: 1 %
Lymphocytes Relative: 13 %
Lymphs Abs: 1 10*3/uL (ref 0.7–4.0)
MCH: 27 pg (ref 26.0–34.0)
MCHC: 29.4 g/dL — ABNORMAL LOW (ref 30.0–36.0)
MCV: 91.8 fL (ref 80.0–100.0)
Monocytes Absolute: 0.5 10*3/uL (ref 0.1–1.0)
Monocytes Relative: 6 %
Neutro Abs: 6.2 10*3/uL (ref 1.7–7.7)
Neutrophils Relative %: 79 %
Platelets: 272 10*3/uL (ref 150–400)
RBC: 2.67 MIL/uL — ABNORMAL LOW (ref 3.87–5.11)
RDW: 18 % — ABNORMAL HIGH (ref 11.5–15.5)
WBC: 7.9 10*3/uL (ref 4.0–10.5)
nRBC: 0.5 % — ABNORMAL HIGH (ref 0.0–0.2)

## 2019-04-02 LAB — BASIC METABOLIC PANEL
Anion gap: 14 (ref 5–15)
BUN: 41 mg/dL — ABNORMAL HIGH (ref 8–23)
CO2: 26 mmol/L (ref 22–32)
Calcium: 7.8 mg/dL — ABNORMAL LOW (ref 8.9–10.3)
Chloride: 102 mmol/L (ref 98–111)
Creatinine, Ser: 1.58 mg/dL — ABNORMAL HIGH (ref 0.44–1.00)
GFR calc Af Amer: 37 mL/min — ABNORMAL LOW (ref >60)
GFR calc non Af Amer: 32 mL/min — ABNORMAL LOW (ref >60)
Glucose, Bld: 115 mg/dL — ABNORMAL HIGH (ref 70–99)
Potassium: 2.7 mmol/L — CL (ref 3.5–5.1)
Sodium: 142 mmol/L (ref 135–145)

## 2019-04-02 LAB — GLUCOSE, CAPILLARY
Glucose-Capillary: 110 mg/dL — ABNORMAL HIGH (ref 70–99)
Glucose-Capillary: 130 mg/dL — ABNORMAL HIGH (ref 70–99)
Glucose-Capillary: 132 mg/dL — ABNORMAL HIGH (ref 70–99)
Glucose-Capillary: 108 mg/dL — ABNORMAL HIGH (ref 70–99)

## 2019-04-02 MED ORDER — CHLORHEXIDINE GLUCONATE CLOTH 2 % EX PADS: 6.0000 | MEDICATED_PAD | Freq: Every day | CUTANEOUS | Status: DC

## 2019-04-02 MED ORDER — MORPHINE SULFATE (PF) 2 MG/ML IV SOLN
1.0000 mg | Freq: Once | INTRAVENOUS | Status: AC
  Administered 2019-04-02: 1 mg via INTRAVENOUS
  Filled 2019-04-02: qty 1

## 2019-04-02 MED ORDER — POTASSIUM CHLORIDE CRYS ER 20 MEQ PO TBCR
20.0000 meq | EXTENDED_RELEASE_TABLET | ORAL | Status: AC
  Administered 2019-04-02 (×3): 20 meq via ORAL
  Filled 2019-04-02 (×3): qty 1

## 2019-04-02 NOTE — Progress Notes (Signed)
Manufacturing engineer [facility]) Hospice  Received referral from [employer], for residential hospice at [facility].   Unfortunately, [facility] does not have a bed to offer today.    Spoke with son [name] to confirm interest and address any questions/concerns.   [name] is aware that [facility] will reach out to him and hospital staff if a bed becomes available.  Thank you for this referral, Venia Carbon RN, BSN, [place] (in [place] under [facility]) [phone] (main #)

## 2019-04-02 NOTE — Progress Notes (Signed)
CRITICAL VALUE ALERT  Critical Value:  Potassium 2.7  Date & Time Notied:  04/02/19 0915  Provider Notified: Wyonia Hough  Orders Received/Actions taken: Awaiting further orders

## 2019-04-02 NOTE — Progress Notes (Signed)
PROGRESS NOTE    Abigail Duncan  RFX:[id number] DOB: [birth year] DOA: 03/28/2019 PCP: McDiarmid, Blane Ohara, MD   Brief Narrative:  [place] a 78 y.o.femalewith a known history of diabetes, peripheral neuropathy obstructive sleep apnea, spinal stenosis, osteoarthritis, chronic pain syndrome, atrial fibrillation onCoumadinpresents to the emergency department for evaluation ofweakness and altered mental status. Patient was hospitalized for urinary tract infection and subsequently went to rehab. She has been home for the past month however per report she has been having increasing difficulty with weakness, difficulty with ambulation and activities of daily living. Per the son the patient has been having worsening confusion over the last week and hallucinations over the last few days..  On questioning patientcomplains only of weakness.Patient denies fevers/chills, weakness, dizziness, chest pain, shortness of breath, N/V/C/D, abdominal pain, dysuria/frequency, changes in mental status.  Admitting MD discussed with her at length her wishes regarding her medical treatment and advanced directives. She stated that she would consider dialysis if she really needed it but would like to speak with the nephrologist prior to making decision.   I HAD ADDITIONAL CONVERSATION WITH HER SONAS DID PALLIATIVE CAREBECAUSE OF THE PERSISTENT CONFUSION/DECLINEAND HE CLARIFIED,SheDid notwant to have CPR orintubation, She "didn't want to be on any machines" Patient was made DNR   Assessment & Plan:   Active Problems:   Type II diabetes mellitus with neurological manifestations ([place])   Pure hypercholesterolemia   Morbid obesity (HCC) with obstructive sleep apnea   Atrial fibrillation, permanent   Goals of care, counseling/discussion   Anemia of chronic disease   AKI (acute kidney injury) ([place])   Shock circulatory (HCC)   Toxic metabolic encephalopathy   Acute renal failure ([place])  Palliative care by specialist   Pressure injury of skin   Acute kidney injury with hyperkalemia. Patient had progressive decline but has been improving, nephrology following, not a dialysis candidate, Has been weaned off pressors > 24h,   renal function is actually improving. Continue to monitor  Normocytic anemia: Patient is heme-negative B12 greater than 2000, hemoglobin7 Continue to monitor, continue patient's home iron.  History ofacuteUTIwith SEVERE SEPSIS -pt was weaned off pressors and has deen considerably better Placed pt/ot consult in anticipation of snf covid neg 6/23  History ofatrial fibrillation -Holding blood pressure meds in the setting of hypotension but will closely monitor and titrate back in as bp increases, currently 139/31 with 117 and 112 sbp this am  History ofdiabetes - ContinueLantus, Accu-Cheks with regular insulin sliding scale coverage -No changes  History ofneuropathy - Continuegabapentin  DVT prophylaxis: SCD/Compression stockings  Code Status: DNR    Code Status Orders  (From admission, onward)         Start     Ordered   03/30/19 1140  Do not attempt resuscitation (DNR)  Continuous    Question Answer Comment  In the event of cardiac or respiratory ARREST Do not call a code blue   In the event of cardiac or respiratory ARREST Do not perform Intubation, CPR, defibrillation or ACLS   In the event of cardiac or respiratory ARREST Use medication by any route, position, wound care, and other measures to relive pain and suffering. May use oxygen, suction and manual treatment of airway obstruction as needed for comfort.   Comments discussed with son who confirmed mother "never wanted to be put on life support machines"      03/30/19 1141        Code Status History    Date Active Date Inactive  Code Status Order ID Comments User Context   03/28/2019 2304 03/30/2019 1141 Full Code [id number]  Harvie Bridge, DO Inpatient    02/15/2019 1836 02/22/2019 0300 Partial Code [id number]  Kathrene Alu, MD ED   Advance Care Planning Activity     Family Communication: NONE TODAY Disposition Plan:   Patient remained inpatient, has been weaned off pressors, renal function is recovering but will require electrolyte management, PT and OT evaluation for possible discharge to skilled nursing facility, patient no longer hospice candidate. Consults called: PCCM, PALLIATIVE Admission status: Inpatient   Consultants:   PCCM, PALLIATIVE  Procedures:  Ct Abdomen Pelvis Wo Contrast  Result Date: 03/28/2019 CLINICAL DATA:  Generalized weakness, generalized body pain, diverticulitis suspected EXAM: CT ABDOMEN AND PELVIS WITHOUT CONTRAST TECHNIQUE: Multidetector CT imaging of the abdomen and pelvis was performed following the standard protocol without IV contrast. COMPARISON:  None. FINDINGS: Lower chest: Cardiomegaly. Extensive 3 vessel coronary artery calcifications. Hepatobiliary: No solid liver abnormality is seen. Gallstones. No gallbladder wall thickening, or biliary dilatation. Pancreas: Unremarkable. No pancreatic ductal dilatation or surrounding inflammatory changes. Spleen: Normal in size without significant abnormality. Adrenals/Urinary Tract: Adrenal glands are unremarkable. Very atrophic right kidney. Unremarkable left kidney. No hydronephrosis. Bladder is unremarkable. Stomach/Bowel: Stomach is within normal limits. Appendix appears normal. No evidence of bowel wall thickening, distention, or inflammatory changes. Sigmoid diverticulosis. Vascular/Lymphatic: No significant vascular findings are present. No enlarged abdominal or pelvic lymph nodes. Reproductive: No mass or other significant abnormality. Other: No abdominal wall hernia or abnormality. No abdominopelvic ascites. Musculoskeletal: No acute or significant osseous findings. IMPRESSION: 1. No acute noncontrast CT findings of the abdomen or pelvis to explain abdominal  pain. 2.  Sigmoid diverticulosis.  No evidence of acute diverticulitis. 3.  Gallstones.  No evidence of acute cholelithiasis. 4.  Other chronic and incidental findings as detailed above. Electronically Signed   By: Eddie Candle M.D.   On: 03/28/2019 19:08   Dg Abd 1 View  Result Date: 03/29/2019 CLINICAL DATA:  Tube placement EXAM: ABDOMEN - 1 VIEW COMPARISON:  CT dated March 28, 2019 FINDINGS: The enteric tube projects over the expected region of the gastric body. The bowel gas pattern is nonspecific and nonobstructive. IMPRESSION: Enteric tube tip projects over the expected region of the gastric body. Electronically Signed   By: Constance Holster M.D.   On: 03/29/2019 02:55   Ct Head Wo Contrast  Result Date: 03/28/2019 CLINICAL DATA:  Generalized weakness x2 months. EXAM: CT HEAD WITHOUT CONTRAST TECHNIQUE: Contiguous axial images were obtained from the base of the skull through the vertex without intravenous contrast. COMPARISON:  None. FINDINGS: Brain: No evidence of acute infarction, hemorrhage, hydrocephalus, extra-axial collection or mass lesion/mass effect. Age related volume loss and chronic microvascular ischemic changes are noted. Vascular: No hyperdense vessel or unexpected calcification. Skull: Normal. Negative for fracture or focal lesion. Sinuses/Orbits: No acute finding. Other: None. IMPRESSION: 1. No acute intracranial abnormality. 2. Age related volume loss and chronic microvascular ischemic changes are noted. Electronically Signed   By: Constance Holster M.D.   On: 03/28/2019 18:54   US Renal  Result Date: 03/29/2019 CLINICAL DATA:  Acute kidney injury EXAM: RENAL / URINARY TRACT ULTRASOUND COMPLETE COMPARISON:  Abdominal CT from yesterday FINDINGS: Right Kidney: Severe atrophy with marked cortical thinning better seen by prior CT. No hydronephrosis. Renal length is 8.8 cm. Left Kidney: Renal measurements: 12 x 6 x 7 cm = volume: 270 mL. Simple cyst measuring 2.9 cm. No  hydronephrosis or abnormal  echogenicity. Bladder: Decompressed completely by a Foley catheter. IMPRESSION: 1. Severe right renal atrophy. 2. Unremarkable left kidney. Electronically Signed   By: Monte Fantasia M.D.   On: 03/29/2019 05:58   Dg Chest Portable 1 View  Result Date: 03/28/2019 CLINICAL DATA:  Generalized weakness. EXAM: PORTABLE CHEST 1 VIEW COMPARISON:  Radiograph of Feb 15, 2019. FINDINGS: Stable cardiomegaly. Atherosclerosis of thoracic aorta is noted. No pneumothorax is noted. Right lung is clear. Probable small left pleural effusion is noted with associated atelectasis. Bony thorax is unremarkable. IMPRESSION: Stable cardiomegaly. Probable small left pleural effusion is noted with associated atelectasis. Aortic Atherosclerosis (ICD10-I70.0). Electronically Signed   By: Marijo Conception M.D.   On: 03/28/2019 16:35     Antimicrobials:   KEFLEX    Subjective: PT CONTINUES TO DO WELL, IMPROVING MENTAL STATUIS AND RENAL FX ANSWERING QUESTIONS APPROPRIATELY  Objective: Vitals:   04/02/19 0500 04/02/19 0600 04/02/19 0615 04/02/19 0915  BP:    (!) 139/31  Pulse: 81 91 87 88  Resp: 19 (!) 21 19 (!) 22  Temp:      TempSrc:      SpO2: 97% 99% 98% 98%  Weight:      Height:        Intake/Output Summary (Last 24 hours) at 04/02/2019 1102 Last data filed at 04/01/2019 2200 Gross per 24 hour  Intake 120 ml  Output 700 ml  Net -580 ml   Filed Weights   03/28/19 2301 03/30/19 0500  Weight: 109.8 kg 118 kg    Examination:  General exam: Appears calm and comfortable, confusion is clearing Respiratory system: Clear to auscultation. Respiratory effort normal. Cardiovascular system: S1 & S2 heard, RRR. No JVD, murmurs, rubs, gallops or clicks. No pedal edema. Gastrointestinal system: Abdomen is nondistended, soft and nontender. No organomegaly or masses felt. Normal bowel sounds heard. Central nervous system: Alert, oriented to name, place, year. No focal neurological  deficits. Extremities: bilat edema, no contractures Skin: No rashes, lesions or ulcers Psychiatry: Judgement and insight improving significantly, mood and affect appropriate     Data Reviewed: I have personally reviewed following labs and imaging studies  CBC: Recent Labs  Lab 03/28/19 1611  03/30/19 0231 03/30/19 1150 03/31/19 0244 04/01/19 0415 04/02/19 0819  WBC 14.2*   < > 9.8 9.2 10.1 7.8 7.9  NEUTROABS 12.0*  --  7.3  --  7.0 5.6 6.2  HGB 9.4*   < > 7.0* 7.4* 7.7* 7.0* 7.2*  HCT 31.2*   < > 22.2* 23.6* 25.8* 23.9* 24.5*  MCV 89.4   < > 85.7 87.4 89.9 90.2 91.8  PLT 497*   < > 302 272 324 287 272   < > = values in this interval not displayed.   Basic Metabolic Panel: Recent Labs  Lab 03/28/19 2315 03/29/19 0420  03/29/19 1037 03/30/19 0231 03/31/19 0244 04/01/19 0415 04/01/19 1600 04/02/19 0819  NA 135 135   < > 137 138 140 141  --  142  K 6.9* 5.9*   < > 4.9 3.7 2.8* 2.6* 3.1* 2.7*  CL 94* 93*   < > 94* 89* 93* 98  --  102  CO2 17* 22   < > 23 32 31 32  --  26  GLUCOSE 252* 262*   < > 180* 161* 138* 112*  --  115*  BUN 109* 116*   < > 109* 92* 78* 56*  --  41*  CREATININE 5.32* 4.82*   < > 3.98* 3.99*  2.94* 2.00*  --  1.58*  CALCIUM 7.1* 6.8*   < > 6.5* 6.0* 6.9* 7.2*  --  7.8*  MG 2.1  --   --   --   --  1.6*  --   --   --   PHOS 8.6* 6.9*  --   --   --  4.5  --   --   --    < > = values in this interval not displayed.   GFR: Estimated Creatinine Clearance: 38.2 mL/min (A) (by C-G formula based on SCr of 1.58 mg/dL (H)). Liver Function Tests: Recent Labs  Lab 03/28/19 1611 03/28/19 2315 03/29/19 0420  AST 33  --  29  ALT 31  --  28  ALKPHOS 75  --  65  BILITOT 0.9  --  0.5  PROT 6.4*  --  5.1*  ALBUMIN 3.1* 2.7* 2.6*   No results for input(s): LIPASE, AMYLASE in the last 168 hours. No results for input(s): AMMONIA in the last 168 hours. Coagulation Profile: Recent Labs  Lab 03/28/19 2156  INR 2.4*   Cardiac Enzymes: No results for  input(s): CKTOTAL, CKMB, CKMBINDEX, TROPONINI in the last 168 hours. BNP (last 3 results) No results for input(s): PROBNP in the last 8760 hours. HbA1C: No results for input(s): HGBA1C in the last 72 hours. CBG: Recent Labs  Lab 03/31/19 2117 04/01/19 0802 04/01/19 1233 04/01/19 1623 04/01/19 2222  GLUCAP 144* 97 147* 158* 111*   Lipid Profile: No results for input(s): CHOL, HDL, LDLCALC, TRIG, CHOLHDL, LDLDIRECT in the last 72 hours. Thyroid Function Tests: No results for input(s): TSH, T4TOTAL, FREET4, T3FREE, THYROIDAB in the last 72 hours. Anemia Panel: No results for input(s): VITAMINB12, FOLATE, FERRITIN, TIBC, IRON, RETICCTPCT in the last 72 hours. Sepsis Labs: Recent Labs  Lab 03/29/19 0420 03/29/19 0657 03/29/19 1037 03/30/19 0847  LATICACIDVEN 5.6* 5.7* 4.0* 2.0*    Recent Results (from the past 240 hour(s))  Urine culture     Status: Abnormal   Collection Time: 03/28/19  3:53 PM   Specimen: Urine, Clean Catch  Result Value Ref Range Status   Specimen Description   Final    URINE, CLEAN CATCH Performed at [facility], [place] [address] [place], [place] [address]    Special Requests   Final    NONE Performed at [facility], [place] [address], [place], [place] [address]    Culture 80,000 COLONIES/mL ESCHERICHIA COLI (A)  Final   Report Status 03/30/2019 FINAL  Final   Organism ID, Bacteria ESCHERICHIA COLI (A)  Final      Susceptibility   Escherichia coli - MIC*    AMPICILLIN <=2 SENSITIVE Sensitive     CEFAZOLIN <=4 SENSITIVE Sensitive     CEFTRIAXONE <=1 SENSITIVE Sensitive     CIPROFLOXACIN <=0.25 SENSITIVE Sensitive     GENTAMICIN 2 SENSITIVE Sensitive     IMIPENEM <=0.25 SENSITIVE Sensitive     NITROFURANTOIN <=16 SENSITIVE Sensitive     TRIMETH/SULFA <=20 SENSITIVE Sensitive     AMPICILLIN/SULBACTAM <=2 SENSITIVE Sensitive     PIP/TAZO <=4 SENSITIVE Sensitive     Extended ESBL NEGATIVE Sensitive     * 80,000  COLONIES/mL ESCHERICHIA COLI  SARS Coronavirus 2 (CEPHEID - Performed in [place] hospital lab), Hosp Order     Status: None   Collection Time: 03/28/19  4:11 PM   Specimen: Nasopharyngeal Swab  Result Value Ref Range Status   SARS Coronavirus 2 NEGATIVE NEGATIVE Final  Comment: (NOTE) If result is NEGATIVE SARS-CoV-2 target nucleic acids are NOT DETECTED. The SARS-CoV-2 RNA is generally detectable in upper and lower  respiratory specimens during the acute phase of infection. The lowest  concentration of SARS-CoV-2 viral copies this assay can detect is 250  copies / mL. A negative result does not preclude SARS-CoV-2 infection  and should not be used as the sole basis for treatment or other  patient management decisions.  A negative result may occur with  improper specimen collection / handling, submission of specimen other  than nasopharyngeal swab, presence of viral mutation(s) within the  areas targeted by this assay, and inadequate number of viral copies  (<250 copies / mL). A negative result must be combined with clinical  observations, patient history, and epidemiological information. If result is POSITIVE SARS-CoV-2 target nucleic acids are DETECTED. The SARS-CoV-2 RNA is generally detectable in upper and lower  respiratory specimens dur ing the acute phase of infection.  Positive  results are indicative of active infection with SARS-CoV-2.  Clinical  correlation with patient history and other diagnostic information is  necessary to determine patient infection status.  Positive results do  not rule out bacterial infection or co-infection with other viruses. If result is PRESUMPTIVE POSTIVE SARS-CoV-2 nucleic acids MAY BE PRESENT.   A presumptive positive result was obtained on the submitted specimen  and confirmed on repeat testing.  While 2019 novel coronavirus  (SARS-CoV-2) nucleic acids may be present in the submitted sample  additional confirmatory testing may be  necessary for epidemiological  and / or clinical management purposes  to differentiate between  SARS-CoV-2 and other Sarbecovirus currently known to infect humans.  If clinically indicated additional testing with an alternate test  methodology [id number]) is advised. The SARS-CoV-2 RNA is generally  detectable in upper and lower respiratory sp ecimens during the acute  phase of infection. The expected result is Negative. Fact Sheet for Patients:  StrictlyIdeas.no Fact Sheet for Healthcare Providers: BankingDealers.co.za This test is not yet approved or cleared by the [place] FDA and has been authorized for detection and/or diagnosis of SARS-CoV-2 by FDA under an Emergency Use Authorization (EUA).  This EUA will remain in effect (meaning this test can be used) for the duration of the COVID-19 declaration under Section 564(b)(1) of the Act, 21 U.S.C. section 360bbb-3(b)(1), unless the authorization is terminated or revoked sooner. Performed at [facility], [place] [address] [place], [place] [address]   MRSA PCR Screening     Status: None   Collection Time: 03/28/19 11:05 PM   Specimen: Nasal Mucosa; Nasopharyngeal  Result Value Ref Range Status   MRSA by PCR NEGATIVE NEGATIVE Final    Comment:        The GeneXpert MRSA Assay (FDA approved for NASAL specimens only), is one component of a comprehensive MRSA colonization surveillance program. It is not intended to diagnose MRSA infection nor to guide or monitor treatment for MRSA infections. Performed at [facility], [place] [address] [place], [place] [address]          Radiology Studies: No results found.      Scheduled Meds:  cephALEXin  500 mg Oral Daily   Chlorhexidine Gluconate Cloth  6 each Topical Daily   ferrous sulfate  325 mg Oral Daily   insulin aspart  0-5 Units Subcutaneous QHS   insulin aspart  0-9 Units  Subcutaneous TID WC   insulin glargine  10 Units Subcutaneous Daily   mouth rinse  15 mL Mouth Rinse BID   potassium chloride  20 mEq Oral Q4H   predniSONE  15 mg Oral Q breakfast   vitamin B-12  1,000 mcg Oral Daily   Continuous Infusions:  sodium chloride     sodium chloride     norepinephrine (LEVOPHED) Adult infusion Stopped (04/01/19 1755)     LOS: 5 days    Time spent: 24 min     Nicolette Bang, MD [facility]  If 7PM-7AM, please contact night-coverage  04/02/2019, 11:02 AM

## 2019-04-02 NOTE — Clinical Social Work Note (Signed)
Clinical Social Work Assessment  Patient Details  Name: Abigail Duncan MRN: [id number] Date of Birth: 07-26-1941  Date of referral:  04/02/19               Reason for consult:  End of Life/Hospice                Permission sought to share information with:  Chartered certified accountant granted to share information::  Yes, Verbal Permission Granted  Name::     Clovis Riley  Agency::  [employer]  Relationship::  Nurse Liasion-Jennifer Woody  Contact Information:  [phone]  Housing/Transportation Living arrangements for the past 2 months:  [place] of Information:  Adult Children Patient Interpreter Needed:  None Criminal Activity/Legal Involvement Pertinent to Current Situation/Hospitalization:  No - Comment as needed Significant Relationships:  Adult Children Lives with:  Adult Children Do you feel safe going back to the place where you live?    Need for family participation in patient care:  Yes (Comment)  Care giving concerns:  None, patient's son agrees with residential hospice   Social Worker assessment / plan:  This Education officer, museum spoke with patient and patient's son who both agree with residential hospice referral. Phone call to Venia Carbon, [phone] nurse liaison to complete referral. Per Anderson Malta, there are no beds available today but will work patient up for possible opening tomorrow  Employment status:  Disabled (Comment on whether or not currently receiving Disability) Insurance information:  Medicare PT Recommendations:    Information / Referral to community resources:  Other (Comment Required)(residential hospice)  Patient/Family's Response to care:  Patient's son agrees with referral to residential hospice and would prefer that patient go to [facility] in [place], [place]  Patient/Family's Understanding of and Emotional Response to Diagnosis, Current Treatment, and Prognosis:  Patient and patient's son both agree that  residential hospice should be the nest step due to poor prognosis and multiple medical needs.  Emotional Assessment Appearance:  Appears stated age Attitude/Demeanor/Rapport:  Lethargic Affect (typically observed):  Flat Orientation:  Oriented to Self, Oriented to Place, Oriented to  Time, Oriented to Situation Alcohol / Substance use:  Not Applicable Psych involvement (Current and /or in the community):  No (Comment)  Discharge Needs  Concerns to be addressed:  No discharge needs identified Readmission within the last 30 days:  No Current discharge risk:  None Barriers to Discharge:  No Barriers Identified   Ardean Larsen, [place] 04/02/2019, 11:51 AM

## 2019-04-03 ENCOUNTER — Other Ambulatory Visit: Payer: Self-pay | Admitting: *Deleted

## 2019-04-03 ENCOUNTER — Ambulatory Visit: Payer: Self-pay

## 2019-04-03 LAB — BASIC METABOLIC PANEL
Anion gap: 10 (ref 5–15)
BUN: 32 mg/dL — ABNORMAL HIGH (ref 8–23)
CO2: 27 mmol/L (ref 22–32)
Calcium: 7.8 mg/dL — ABNORMAL LOW (ref 8.9–10.3)
Chloride: 103 mmol/L (ref 98–111)
Creatinine, Ser: 1.43 mg/dL — ABNORMAL HIGH (ref 0.44–1.00)
GFR calc Af Amer: 41 mL/min — ABNORMAL LOW (ref >60)
GFR calc non Af Amer: 36 mL/min — ABNORMAL LOW (ref >60)
Glucose, Bld: 93 mg/dL (ref 70–99)
Potassium: 3.7 mmol/L (ref 3.5–5.1)
Sodium: 140 mmol/L (ref 135–145)

## 2019-04-03 LAB — CBC WITH DIFFERENTIAL/PLATELET
Abs Immature Granulocytes: 0.16 10*3/uL — ABNORMAL HIGH (ref 0.00–0.07)
Basophils Absolute: 0 10*3/uL (ref 0.0–0.1)
Basophils Relative: 0 %
Eosinophils Absolute: 0.2 10*3/uL (ref 0.0–0.5)
Eosinophils Relative: 2 %
HCT: 24.5 % — ABNORMAL LOW (ref 36.0–46.0)
Hemoglobin: 7 g/dL — ABNORMAL LOW (ref 12.0–15.0)
Immature Granulocytes: 2 %
Lymphocytes Relative: 15 %
Lymphs Abs: 1.1 10*3/uL (ref 0.7–4.0)
MCH: 27 pg (ref 26.0–34.0)
MCHC: 28.6 g/dL — ABNORMAL LOW (ref 30.0–36.0)
MCV: 94.6 fL (ref 80.0–100.0)
Monocytes Absolute: 0.5 10*3/uL (ref 0.1–1.0)
Monocytes Relative: 7 %
Neutro Abs: 5.8 10*3/uL (ref 1.7–7.7)
Neutrophils Relative %: 74 %
Platelets: 261 10*3/uL (ref 150–400)
RBC: 2.59 MIL/uL — ABNORMAL LOW (ref 3.87–5.11)
RDW: 18.4 % — ABNORMAL HIGH (ref 11.5–15.5)
WBC: 7.7 10*3/uL (ref 4.0–10.5)
nRBC: 0.8 % — ABNORMAL HIGH (ref 0.0–0.2)

## 2019-04-03 LAB — GLUCOSE, CAPILLARY
Glucose-Capillary: 86 mg/dL (ref 70–99)
Glucose-Capillary: 105 mg/dL — ABNORMAL HIGH (ref 70–99)

## 2019-04-03 MED ORDER — DIPHENHYDRAMINE HCL 50 MG/ML IJ SOLN
25.0000 mg | Freq: Once | INTRAMUSCULAR | Status: AC
  Administered 2019-04-03: 25 mg via INTRAVENOUS
  Filled 2019-04-03: qty 1

## 2019-04-03 MED ORDER — ACETAMINOPHEN 325 MG PO TABS: 650.0000 mg | ORAL_TABLET | Freq: Four times a day (QID) | ORAL | Status: AC | PRN

## 2019-04-03 NOTE — Consult Note (Addendum)
   [facility] CM Inpatient Consult   04/03/2019  Abigail Duncan 10/29/1940 [id number]   [facility] Follow up:  Patient is active with [place] RN and [facility] CM social worker to assist with chronic disease management and community needs.   Per chart review, current disposition plan is for hospice at [facility]. Will notify community RN and Education officer, museum of current disposition plan.  Netta Cedars, MSN, [facility] Liaison Nurse Mobile Phone [phone]  Toll free office [phone]

## 2019-04-03 NOTE — Progress Notes (Signed)
[facility] room is available today. Spoke with son [name] by phone. Plan to meet him at [facility] to complete paper work at noon. Will update  when paper work is complete.   Will need discharge summary sent to [phone].  RN please call report to [facility] before patient leaves the unit at [phone].  Thank you,  Erling Conte, LCSW [phone]

## 2019-04-03 NOTE — TOC Transition Note (Signed)
Transition of Care [facility]) - CM/SW Discharge Note   Patient Details  Name: Abigail Duncan MRN: [id number] Date of Birth: 1941-02-10  Transition of Care [facility]) CM/SW Contact:  Joaquin Courts, RN Phone Number: 04/03/2019, 1:34 PM   Clinical Narrative:       Final next level of care: Hospice Medical Facility Barriers to Discharge: No Barriers Identified   Patient Goals and CMS Choice        Discharge Placement Patient ready for discharge to [facility]. [facility] transportation forms left at nurses desk. Bedside RN to call [facility] when ready to transport. Bedside RN to call report to [facility].                        Discharge Plan and Services                                     Social Determinants of Health (SDOH) Interventions     Readmission Risk Interventions Readmission Risk Prevention Plan 04/03/2019  Transportation Screening Complete  Medication Review ([place]) Complete  PCP or Specialist appointment within 3-5 days of discharge Complete  HRI or [place] Complete  SW Recovery Care/Counseling Consult Complete  Palliative Care Screening Complete  [place] Not Applicable  Some recent data might be hidden

## 2019-04-03 NOTE — Patient Outreach (Signed)
[place] [facility]) Care Management  04/03/2019  Abigail Duncan 09/15/1941 [id number]   Member discharged from hospital to [facility] for residential hospice.  Will close case at this time and notify primary MD of case closure.  Valente David, [place], MSN [place] [phone]

## 2019-04-03 NOTE — Discharge Summary (Signed)
Physician Discharge Summary  Abigail Duncan HYW:[id number] DOB: Feb 25, 1941 DOA: 03/28/2019  PCP: McDiarmid, Blane Ohara, MD  Admit date: 03/28/2019 Discharge date: 04/03/2019  Admitted From: Inpatient Disposition: [place]  Recommendations for Outpatient Follow-up:  1. Follow up with PCP as needed  Home Health:No Equipment/Devices:none  Discharge Condition:Fair CODE STATUS:DNR Diet recommendation: Diabetic diet  Brief/Interim Summary: Per admitting MD: [name] a 78 y.o.femalewith a known history of diabetes, peripheral neuropathy obstructive sleep apnea, spinal stenosis, osteoarthritis, chronic pain syndrome, atrial fibrillation onCoumadinpresents to the emergency department for evaluation ofweakness and altered mental status. Patient was hospitalized for urinary tract infection and subsequently went to rehab. She has been home for the past month however per report she has been having increasing difficulty with weakness, difficulty with ambulation and activities of daily living. Per the son the patient has been having worsening confusion over the last week and hallucinations over the last few days..  On questioning patientcomplains only of weakness.Patient denies fevers/chills, weakness, dizziness, chest pain, shortness of breath, N/V/C/D, abdominal pain, dysuria/frequency, changes in mental status.  Admitting MD discussed with her at length her wishes regarding her medical treatment and advanced directives. She stated that she would consider dialysis if she really needed it but would like to speak with the nephrologist prior to making decision.   I HAD ADDITIONAL CONVERSATION WITH HER SONAS DID PALLIATIVE CAREBECAUSE OF THE PERSISTENT CONFUSION/DECLINEAND HE CLARIFIED,SheDid notwant to have CPR orintubation, She "didn't want to be on any machines" Patient was made DNR  Hospital course: Acute kidney injury with hyperkalemia.  Patient's had progressive  decline in kidney function.  She was seen by nephrology and indicates she is not a dialysis candidate.  Patient was placed on pressors because of hypotension but weaned off,.  Kidney function stabilized, poor prognosis  Chronic normocytic anemia.  Patient with anemia of chronic disease.  Hemoglobin 7.0 did not transfuse pursuing hospice care at [facility] per palliative care consult.  UTI with severe sepsis.  As noted patient was placed on antibiotics white count is remained normal she is completed antibiotics was weaned off of pressors  History ofatrial fibrillation -Held blood pressure meds because of hypotension.  Can reinitiate as clinically indicated per goals of care  History ofdiabetes - ContinueLantus, Accu-Cheks with regular insulin sliding scale coverage -No changes for now, treatment plan per receiving facility  History ofneuropathy - Continuegabapentin for comfort if needed per receiving facility  Disposition [facility] today  Discharge Diagnoses:  Active Problems:   Type II diabetes mellitus with neurological manifestations ([place])   Pure hypercholesterolemia   Morbid obesity (HCC) with obstructive sleep apnea   Atrial fibrillation, permanent   Goals of care, counseling/discussion   Anemia of chronic disease   AKI (acute kidney injury) ([place])   Shock circulatory (HCC)   Toxic metabolic encephalopathy   Acute renal failure (HCC)   Palliative care by specialist   Pressure injury of skin    Discharge Instructions  Discharge Instructions    Call MD for:  difficulty breathing, headache or visual disturbances   Complete by: As directed    Call MD for:  persistant nausea and vomiting   Complete by: As directed    Call MD for:  redness, tenderness, or signs of infection (pain, swelling, redness, odor or green/yellow discharge around incision site)   Complete by: As directed    Call MD for:  severe uncontrolled pain   Complete by: As directed    Call MD for:   temperature >100.4  Complete by: As directed    Diet Carb Modified   Complete by: As directed    Increase activity slowly   Complete by: As directed      Allergies as of 76/29/2020      Reactions   Lisinopril    REACTION: bad dreams      Medication List    STOP taking these medications   cephALEXin 500 MG capsule Commonly known as: KEFLEX   HYDROcodone-acetaminophen 5-325 MG tablet Commonly known as: NORCO/VICODIN   onetouch ultrasoft lancets     TAKE these medications   acetaminophen 325 MG tablet Commonly known as: TYLENOL Take 2 tablets (650 mg total) by mouth every 6 (six) hours as needed for mild pain (or Fever >/= 101).   apixaban 5 MG Tabs tablet Commonly known as: ELIQUIS Take 1 tablet (5 mg total) by mouth 2 (two) times daily.   enalapril 10 MG tablet Commonly known as: VASOTEC TAKE 1 TABLET BY MOUTH EVERY DAY   ferrous sulfate 325 (65 FE) MG tablet Take 1 tablet (325 mg total) by mouth daily.   fluticasone 50 MCG/ACT nasal spray Commonly known as: FLONASE SPRAY 2 SPRAYS INTO EACH NOSTRIL EVERY DAY What changed: See the new instructions.   furosemide 40 MG tablet Commonly known as: LASIX TAKE 1 TABLET BY MOUTH TWICE A DAY What changed: when to take this   gabapentin 300 MG capsule Commonly known as: NEURONTIN Take 1 capsule (300 mg total) by mouth 3 (three) times daily.   glucose blood test strip Use as instructed   glucose blood test strip Use as instructed   insulin glargine 100 UNIT/ML injection Commonly known as: LANTUS Inject 0.1 mLs (10 Units total) into the skin daily.   Klor-Con M20 20 MEQ tablet Generic drug: potassium chloride SA TAKE 1 TABLET BY MOUTH EVERY DAY What changed: how much to take   metFORMIN 500 MG tablet Commonly known as: GLUCOPHAGE TAKE 2 TABLETS BY MOUTH EVERY MORNING AND 1 TABLET EVERY EVENING What changed:   how much to take  how to take this  when to take this   metoprolol tartrate 100 MG  tablet Commonly known as: LOPRESSOR TAKE 1 TABLET (100 MG TOTAL) 2 (TWO) TIMES DAILY BY MOUTH.   nitroGLYCERIN 0.4 MG SL tablet Commonly known as: Nitrostat PLACE 1 TABLET (0.4 MG TOTAL) UNDER THE TONGUE EVERY 5 (FIVE) MINUTES AS NEEDED FOR CHEST PAIN.   OneTouch Verio w/Device Kit 1 Device by Does not apply route 3 (three) times daily.   predniSONE 5 MG tablet Commonly known as: DELTASONE Take 3 tablets (15 mg total) by mouth daily with breakfast for 30 days.   rosuvastatin 20 MG tablet Commonly known as: CRESTOR Take 0.5 tablets (10 mg total) by mouth daily.   vitamin B-12 1000 MCG tablet Commonly known as: CYANOCOBALAMIN Take 1,000 mcg by mouth daily.   Vitamin D (Ergocalciferol) 1.25 MG (50000 UT) Caps capsule Commonly known as: DRISDOL Take 50,000 Units by mouth once a week.       Allergies  Allergen Reactions  . Lisinopril     REACTION: bad dreams    Consultations:  PCCM, palliative care   Procedures/Studies: Ct Abdomen Pelvis Wo Contrast  Result Date: 03/28/2019 CLINICAL DATA:  Generalized weakness, generalized body pain, diverticulitis suspected EXAM: CT ABDOMEN AND PELVIS WITHOUT CONTRAST TECHNIQUE: Multidetector CT imaging of the abdomen and pelvis was performed following the standard protocol without IV contrast. COMPARISON:  None. FINDINGS: Lower chest: Cardiomegaly. Extensive 3 vessel  coronary artery calcifications. Hepatobiliary: No solid liver abnormality is seen. Gallstones. No gallbladder wall thickening, or biliary dilatation. Pancreas: Unremarkable. No pancreatic ductal dilatation or surrounding inflammatory changes. Spleen: Normal in size without significant abnormality. Adrenals/Urinary Tract: Adrenal glands are unremarkable. Very atrophic right kidney. Unremarkable left kidney. No hydronephrosis. Bladder is unremarkable. Stomach/Bowel: Stomach is within normal limits. Appendix appears normal. No evidence of bowel wall thickening, distention, or  inflammatory changes. Sigmoid diverticulosis. Vascular/Lymphatic: No significant vascular findings are present. No enlarged abdominal or pelvic lymph nodes. Reproductive: No mass or other significant abnormality. Other: No abdominal wall hernia or abnormality. No abdominopelvic ascites. Musculoskeletal: No acute or significant osseous findings. IMPRESSION: 1. No acute noncontrast CT findings of the abdomen or pelvis to explain abdominal pain. 2.  Sigmoid diverticulosis.  No evidence of acute diverticulitis. 3.  Gallstones.  No evidence of acute cholelithiasis. 4.  Other chronic and incidental findings as detailed above. Electronically Signed   By: Eddie Candle M.D.   On: 03/28/2019 19:08   Dg Abd 1 View  Result Date: 03/29/2019 CLINICAL DATA:  Tube placement EXAM: ABDOMEN - 1 VIEW COMPARISON:  CT dated March 28, 2019 FINDINGS: The enteric tube projects over the expected region of the gastric body. The bowel gas pattern is nonspecific and nonobstructive. IMPRESSION: Enteric tube tip projects over the expected region of the gastric body. Electronically Signed   By: Constance Holster M.D.   On: 03/29/2019 02:55   Ct Head Wo Contrast  Result Date: 03/28/2019 CLINICAL DATA:  Generalized weakness x2 months. EXAM: CT HEAD WITHOUT CONTRAST TECHNIQUE: Contiguous axial images were obtained from the base of the skull through the vertex without intravenous contrast. COMPARISON:  None. FINDINGS: Brain: No evidence of acute infarction, hemorrhage, hydrocephalus, extra-axial collection or mass lesion/mass effect. Age related volume loss and chronic microvascular ischemic changes are noted. Vascular: No hyperdense vessel or unexpected calcification. Skull: Normal. Negative for fracture or focal lesion. Sinuses/Orbits: No acute finding. Other: None. IMPRESSION: 1. No acute intracranial abnormality. 2. Age related volume loss and chronic microvascular ischemic changes are noted. Electronically Signed   By: Constance Holster  M.D.   On: 03/28/2019 18:54   US Renal  Result Date: 03/29/2019 CLINICAL DATA:  Acute kidney injury EXAM: RENAL / URINARY TRACT ULTRASOUND COMPLETE COMPARISON:  Abdominal CT from yesterday FINDINGS: Right Kidney: Severe atrophy with marked cortical thinning better seen by prior CT. No hydronephrosis. Renal length is 8.8 cm. Left Kidney: Renal measurements: 12 x 6 x 7 cm = volume: 270 mL. Simple cyst measuring 2.9 cm. No hydronephrosis or abnormal echogenicity. Bladder: Decompressed completely by a Foley catheter. IMPRESSION: 1. Severe right renal atrophy. 2. Unremarkable left kidney. Electronically Signed   By: Monte Fantasia M.D.   On: 03/29/2019 05:58   Dg Chest Portable 1 View  Result Date: 03/28/2019 CLINICAL DATA:  Generalized weakness. EXAM: PORTABLE CHEST 1 VIEW COMPARISON:  Radiograph of Feb 15, 2019. FINDINGS: Stable cardiomegaly. Atherosclerosis of thoracic aorta is noted. No pneumothorax is noted. Right lung is clear. Probable small left pleural effusion is noted with associated atelectasis. Bony thorax is unremarkable. IMPRESSION: Stable cardiomegaly. Probable small left pleural effusion is noted with associated atelectasis. Aortic Atherosclerosis (ICD10-I70.0). Electronically Signed   By: Marijo Conception M.D.   On: 03/28/2019 16:35       Subjective: No acute decompensation overnight  Discharge Exam: Vitals:   04/02/19 2043 04/03/19 0414  BP: 140/76 (!) 143/59  Pulse: 84 87  Resp: 20 20  Temp: 98.5  F (36.9 C) 97.7 F (36.5 C)  SpO2: 100% 100%   Vitals:   04/02/19 0915 04/02/19 1207 04/02/19 2043 04/03/19 0414  BP: (!) 139/31 133/66 140/76 (!) 143/59  Pulse: 88 79 84 87  Resp: (!) 22 (!) _0 Temp:  98 F (36.7 C) 98.5 F (36.9 C) 97.7 F (36.5 C)  TempSrc:  Oral Oral Oral  SpO2: 98% 96% 100% 100%  Weight:      Height:        General: Pt is alert, awake, not in acute distress Cardiovascular: Irregular irregular rate controlled Respiratory: CTA  bilaterally, no wheezing, no rhonchi Abdominal: Soft, NT, ND, bowel sounds + Extremities: no edema, no cyanosis edematous bilateral lower extremities and upper extremities    The results of significant diagnostics from this hospitalization (including imaging, microbiology, ancillary and laboratory) are listed below for reference.     Microbiology: Recent Results (from the past 240 hour(s))  Urine culture     Status: Abnormal   Collection Time: 03/28/19  3:53 PM   Specimen: Urine, Clean Catch  Result Value Ref Range Status   Specimen Description   Final    URINE, CLEAN CATCH Performed at [facility], [place] [address], [place], [place] [address]    Special Requests   Final    NONE Performed at [facility], [place] [address] [place], [place] [address]    Culture 80,000 COLONIES/mL ESCHERICHIA COLI (A)  Final   Report Status 03/30/2019 FINAL  Final   Organism ID, Bacteria ESCHERICHIA COLI (A)  Final      Susceptibility   Escherichia coli - MIC*    AMPICILLIN <=2 SENSITIVE Sensitive     CEFAZOLIN <=4 SENSITIVE Sensitive     CEFTRIAXONE <=1 SENSITIVE Sensitive     CIPROFLOXACIN <=0.25 SENSITIVE Sensitive     GENTAMICIN 2 SENSITIVE Sensitive     IMIPENEM <=0.25 SENSITIVE Sensitive     NITROFURANTOIN <=16 SENSITIVE Sensitive     TRIMETH/SULFA <=20 SENSITIVE Sensitive     AMPICILLIN/SULBACTAM <=2 SENSITIVE Sensitive     PIP/TAZO <=4 SENSITIVE Sensitive     Extended ESBL NEGATIVE Sensitive     * 80,000 COLONIES/mL ESCHERICHIA COLI  SARS Coronavirus 2 (CEPHEID - Performed in [place] hospital lab), Hosp Order     Status: None   Collection Time: 03/28/19  4:11 PM   Specimen: Nasopharyngeal Swab  Result Value Ref Range Status   SARS Coronavirus 2 NEGATIVE NEGATIVE Final    Comment: (NOTE) If result is NEGATIVE SARS-CoV-2 target nucleic acids are NOT DETECTED. The SARS-CoV-2 RNA is generally detectable in upper and lower  respiratory specimens  during the acute phase of infection. The lowest  concentration of SARS-CoV-2 viral copies this assay can detect is 250  copies / mL. A negative result does not preclude SARS-CoV-2 infection  and should not be used as the sole basis for treatment or other  patient management decisions.  A negative result may occur with  improper specimen collection / handling, submission of specimen other  than nasopharyngeal swab, presence of viral mutation(s) within the  areas targeted by this assay, and inadequate number of viral copies  (<250 copies / mL). A negative result must be combined with clinical  observations, patient history, and epidemiological information. If result is POSITIVE SARS-CoV-2 target nucleic acids are DETECTED. The SARS-CoV-2 RNA is generally detectable in upper and lower  respiratory specimens dur ing the acute phase of infection.  Positive  results  are indicative of active infection with SARS-CoV-2.  Clinical  correlation with patient history and other diagnostic information is  necessary to determine patient infection status.  Positive results do  not rule out bacterial infection or co-infection with other viruses. If result is PRESUMPTIVE POSTIVE SARS-CoV-2 nucleic acids MAY BE PRESENT.   A presumptive positive result was obtained on the submitted specimen  and confirmed on repeat testing.  While 2019 novel coronavirus  (SARS-CoV-2) nucleic acids may be present in the submitted sample  additional confirmatory testing may be necessary for epidemiological  and / or clinical management purposes  to differentiate between  SARS-CoV-2 and other Sarbecovirus currently known to infect humans.  If clinically indicated additional testing with an alternate test  methodology [phone]) is advised. The SARS-CoV-2 RNA is generally  detectable in upper and lower respiratory sp ecimens during the acute  phase of infection. The expected result is Negative. Fact Sheet for Patients:   StrictlyIdeas.no Fact Sheet for Healthcare Providers: BankingDealers.co.za This test is not yet approved or cleared by the [place] FDA and has been authorized for detection and/or diagnosis of SARS-CoV-2 by FDA under an Emergency Use Authorization (EUA).  This EUA will remain in effect (meaning this test can be used) for the duration of the COVID-19 declaration under Section 564(b)(1) of the Act, 21 U.S.C. section 360bbb-3(b)(1), unless the authorization is terminated or revoked sooner. Performed at [facility], [place] [address], [place], [place] [address]   MRSA PCR Screening     Status: None   Collection Time: 03/28/19 11:05 PM   Specimen: Nasal Mucosa; Nasopharyngeal  Result Value Ref Range Status   MRSA by PCR NEGATIVE NEGATIVE Final    Comment:        The GeneXpert MRSA Assay (FDA approved for NASAL specimens only), is one component of a comprehensive MRSA colonization surveillance program. It is not intended to diagnose MRSA infection nor to guide or monitor treatment for MRSA infections. Performed at [facility], [place] [address], [place], [place] [address]      Labs: BNP (last 3 results) No results for input(s): BNP in the last 8760 hours. Basic Metabolic Panel: Recent Labs  Lab 03/28/19 2315 03/29/19 0420  03/30/19 0231 03/31/19 0244 04/01/19 0415 04/01/19 1600 04/02/19 0819 04/03/19 0508  NA 135 135   < > 138 140 141  --  142 140  K 6.9* 5.9*   < > 3.7 2.8* 2.6* 3.1* 2.7* 3.7  CL 94* 93*   < > 89* 93* 98  --  102 103  CO2 17* 22   < > 32 31 32  --  26 27  GLUCOSE 252* 262*   < > 161* 138* 112*  --  115* 93  BUN 109* 116*   < > 92* 78* 56*  --  41* 32*  CREATININE 5.32* 4.82*   < > 3.99* 2.94* 2.00*  --  1.58* 1.43*  CALCIUM 7.1* 6.8*   < > 6.0* 6.9* 7.2*  --  7.8* 7.8*  MG 2.1  --   --   --  1.6*  --   --   --   --   PHOS 8.6* 6.9*  --   --  4.5  --   --   --   --     < > = values in this interval not displayed.   Liver Function Tests: Recent Labs  Lab 03/28/19 1611 03/28/19 2315 03/29/19 0420  AST 33  --  29  ALT 31  --  28  ALKPHOS 75  --  65  BILITOT 0.9  --  0.5  PROT 6.4*  --  5.1*  ALBUMIN 3.1* 2.7* 2.6*   No results for input(s): LIPASE, AMYLASE in the last 168 hours. No results for input(s): AMMONIA in the last 168 hours. CBC: Recent Labs  Lab 03/30/19 0231 03/30/19 1150 03/31/19 0244 04/01/19 0415 04/02/19 0819 04/03/19 0508  WBC 9.8 9.2 10.1 7.8 7.9 7.7  NEUTROABS 7.3  --  7.0 5.6 6.2 5.8  HGB 7.0* 7.4* 7.7* 7.0* 7.2* 7.0*  HCT 22.2* 23.6* 25.8* 23.9* 24.5* 24.5*  MCV 85.7 87.4 89.9 90.2 91.8 94.6  PLT 302 272 324 287 272 261   Cardiac Enzymes: No results for input(s): CKTOTAL, CKMB, CKMBINDEX, TROPONINI in the last 168 hours. BNP: Invalid input(s): POCBNP CBG: Recent Labs  Lab 04/02/19 1219 04/02/19 1642 04/02/19 2036 04/03/19 0726 04/03/19 1156  GLUCAP 130* 132* 108* 86 105*   D-Dimer No results for input(s): DDIMER in the last 72 hours. Hgb A1c No results for input(s): HGBA1C in the last 72 hours. Lipid Profile No results for input(s): CHOL, HDL, LDLCALC, TRIG, CHOLHDL, LDLDIRECT in the last 72 hours. Thyroid function studies No results for input(s): TSH, T4TOTAL, T3FREE, THYROIDAB in the last 72 hours.  Invalid input(s): FREET3 Anemia work up No results for input(s): VITAMINB12, FOLATE, FERRITIN, TIBC, IRON, RETICCTPCT in the last 72 hours. Urinalysis    Component Value Date/Time   COLORURINE YELLOW 03/28/2019 1553   APPEARANCEUR HAZY (A) 03/28/2019 1553   LABSPEC 1.013 03/28/2019 1553   PHURINE 5.0 03/28/2019 1553   GLUCOSEU NEGATIVE 03/28/2019 1553   HGBUR MODERATE (A) 03/28/2019 1553   BILIRUBINUR NEGATIVE 03/28/2019 1553   BILIRUBINUR small 06/16/2011 1713   KETONESUR 5 (A) 03/28/2019 1553   PROTEINUR 30 (A) 03/28/2019 1553   UROBILINOGEN 1.0 06/16/2011 1713   NITRITE NEGATIVE 03/28/2019  1553   LEUKOCYTESUR TRACE (A) 03/28/2019 1553   Sepsis Labs Invalid input(s): PROCALCITONIN,  WBC,  LACTICIDVEN Microbiology Recent Results (from the past 240 hour(s))  Urine culture     Status: Abnormal   Collection Time: 03/28/19  3:53 PM   Specimen: Urine, Clean Catch  Result Value Ref Range Status   Specimen Description   Final    URINE, CLEAN CATCH Performed at [facility], [place] [address], [place], [place] [address]    Special Requests   Final    NONE Performed at [facility], [place] [address], [place], [place] [address]    Culture 80,000 COLONIES/mL ESCHERICHIA COLI (A)  Final   Report Status 03/30/2019 FINAL  Final   Organism ID, Bacteria ESCHERICHIA COLI (A)  Final      Susceptibility   Escherichia coli - MIC*    AMPICILLIN <=2 SENSITIVE Sensitive     CEFAZOLIN <=4 SENSITIVE Sensitive     CEFTRIAXONE <=1 SENSITIVE Sensitive     CIPROFLOXACIN <=0.25 SENSITIVE Sensitive     GENTAMICIN 2 SENSITIVE Sensitive     IMIPENEM <=0.25 SENSITIVE Sensitive     NITROFURANTOIN <=16 SENSITIVE Sensitive     TRIMETH/SULFA <=20 SENSITIVE Sensitive     AMPICILLIN/SULBACTAM <=2 SENSITIVE Sensitive     PIP/TAZO <=4 SENSITIVE Sensitive     Extended ESBL NEGATIVE Sensitive     * 80,000 COLONIES/mL ESCHERICHIA COLI  SARS Coronavirus 2 (CEPHEID - Performed in [place] hospital lab), Hosp Order     Status: None   Collection Time: 03/28/19  4:11 PM   Specimen: Nasopharyngeal Swab  Result Value Ref Range Status   SARS Coronavirus 2 NEGATIVE NEGATIVE Final    Comment: (NOTE) If result is NEGATIVE SARS-CoV-2 target nucleic acids are NOT DETECTED. The SARS-CoV-2 RNA is generally detectable in upper and lower  respiratory specimens during the acute phase of infection. The lowest  concentration of SARS-CoV-2 viral copies this assay can detect is 250  copies / mL. A negative result does not preclude SARS-CoV-2 infection  and should not be used as the sole  basis for treatment or other  patient management decisions.  A negative result may occur with  improper specimen collection / handling, submission of specimen other  than nasopharyngeal swab, presence of viral mutation(s) within the  areas targeted by this assay, and inadequate number of viral copies  (<250 copies / mL). A negative result must be combined with clinical  observations, patient history, and epidemiological information. If result is POSITIVE SARS-CoV-2 target nucleic acids are DETECTED. The SARS-CoV-2 RNA is generally detectable in upper and lower  respiratory specimens dur ing the acute phase of infection.  Positive  results are indicative of active infection with SARS-CoV-2.  Clinical  correlation with patient history and other diagnostic information is  necessary to determine patient infection status.  Positive results do  not rule out bacterial infection or co-infection with other viruses. If result is PRESUMPTIVE POSTIVE SARS-CoV-2 nucleic acids MAY BE PRESENT.   A presumptive positive result was obtained on the submitted specimen  and confirmed on repeat testing.  While 2019 novel coronavirus  (SARS-CoV-2) nucleic acids may be present in the submitted sample  additional confirmatory testing may be necessary for epidemiological  and / or clinical management purposes  to differentiate between  SARS-CoV-2 and other Sarbecovirus currently known to infect humans.  If clinically indicated additional testing with an alternate test  methodology [phone]) is advised. The SARS-CoV-2 RNA is generally  detectable in upper and lower respiratory sp ecimens during the acute  phase of infection. The expected result is Negative. Fact Sheet for Patients:  StrictlyIdeas.no Fact Sheet for Healthcare Providers: BankingDealers.co.za This test is not yet approved or cleared by the [place] FDA and has been authorized for detection  and/or diagnosis of SARS-CoV-2 by FDA under an Emergency Use Authorization (EUA).  This EUA will remain in effect (meaning this test can be used) for the duration of the COVID-19 declaration under Section 564(b)(1) of the Act, 21 U.S.C. section 360bbb-3(b)(1), unless the authorization is terminated or revoked sooner. Performed at [facility], [place] [address] [place], [place] [address]   MRSA PCR Screening     Status: None   Collection Time: 03/28/19 11:05 PM   Specimen: Nasal Mucosa; Nasopharyngeal  Result Value Ref Range Status   MRSA by PCR NEGATIVE NEGATIVE Final    Comment:        The GeneXpert MRSA Assay (FDA approved for NASAL specimens only), is one component of a comprehensive MRSA colonization surveillance program. It is not intended to diagnose MRSA infection nor to guide or monitor treatment for MRSA infections. Performed at [facility], [place] [address] [place], [place] [address]      Time coordinating discharge: Over 30 minutes  SIGNED:   Nicolette Bang, MD  [facility] 04/03/2019, 12:15 PM Pager   If 7PM-7AM, please contact night-coverage [email or web address] Password [email or web address]

## 2019-04-03 NOTE — Care Management Important Message (Signed)
Important Message  Patient Details IM Letter given to Nancy Marus RN to present to the Patient Name: Abigail Duncan MRN: [id number] Date of Birth: [birth year]   Medicare Important Message Given:  Yes     Kerin Salen 04/03/2019, 12:12 PM

## 2019-04-04 LAB — NOVEL CORONAVIRUS, NAA (HOSP ORDER, SEND-OUT TO REF LAB; TAT 18-24 HRS): SARS-CoV-2, NAA: NOT DETECTED

## 2019-04-12 ENCOUNTER — Ambulatory Visit: Payer: Medicare Other | Admitting: *Deleted

## 2019-04-13 ENCOUNTER — Telehealth: Payer: Medicare Other | Admitting: Family Medicine

## 2019-04-14 ENCOUNTER — Ambulatory Visit: Payer: Medicare Other | Admitting: *Deleted

## 2019-04-17 ENCOUNTER — Other Ambulatory Visit: Payer: Self-pay | Admitting: *Deleted

## 2019-04-17 ENCOUNTER — Other Ambulatory Visit: Payer: Self-pay | Admitting: Interventional Cardiology

## 2019-04-17 ENCOUNTER — Ambulatory Visit: Payer: Self-pay | Admitting: *Deleted

## 2019-04-17 NOTE — Patient Outreach (Signed)
[place] [facility]) Care Management  04/17/2019  Abigail Duncan July 25, 1941 [id number]   CSW was advised by [facility] staff that pt has transitioned to Hospice care. CSW will close referral at this time.   Eduard Clos, MSW, [place] Worker  [place] [phone]

## 2019-04-17 NOTE — Patient Outreach (Signed)
Abigail Duncan [facility]) Care Management  04/17/2019  [name] [birth year] [id number]   CSW attempted to reach pt by phone today and was unable to reach. CSW left a HIPPA compliant voice message and will try again in 3-4 business days.   Eduard Clos, MSW, [place] Worker  [place] [phone]

## 2019-04-20 ENCOUNTER — Ambulatory Visit: Payer: Self-pay | Admitting: *Deleted

## 2019-04-20 DIAGNOSIS — I509 Heart failure, unspecified: Secondary | ICD-10-CM | POA: Diagnosis not present

## 2019-04-23 ENCOUNTER — Other Ambulatory Visit: Payer: Self-pay | Admitting: Interventional Cardiology

## 2019-04-26 DIAGNOSIS — I272 Pulmonary hypertension, unspecified: Secondary | ICD-10-CM | POA: Diagnosis not present

## 2019-04-26 DIAGNOSIS — E1140 Type 2 diabetes mellitus with diabetic neuropathy, unspecified: Secondary | ICD-10-CM | POA: Diagnosis not present

## 2019-04-26 DIAGNOSIS — M5136 Other intervertebral disc degeneration, lumbar region: Secondary | ICD-10-CM | POA: Diagnosis not present

## 2019-05-06 DEATH — deceased

## 2019-06-06 ENCOUNTER — Ambulatory Visit: Payer: Medicare Other | Admitting: Interventional Cardiology

## 2019-12-29 IMAGING — DX PORTABLE CHEST - 1 VIEW
1 series · 1 of 1 positions shown · non-contrast
Comparison: Radiograph February 15, 2019.

CLINICAL DATA: Generalized weakness.

EXAM:
PORTABLE CHEST 1 VIEW

[chest ap]
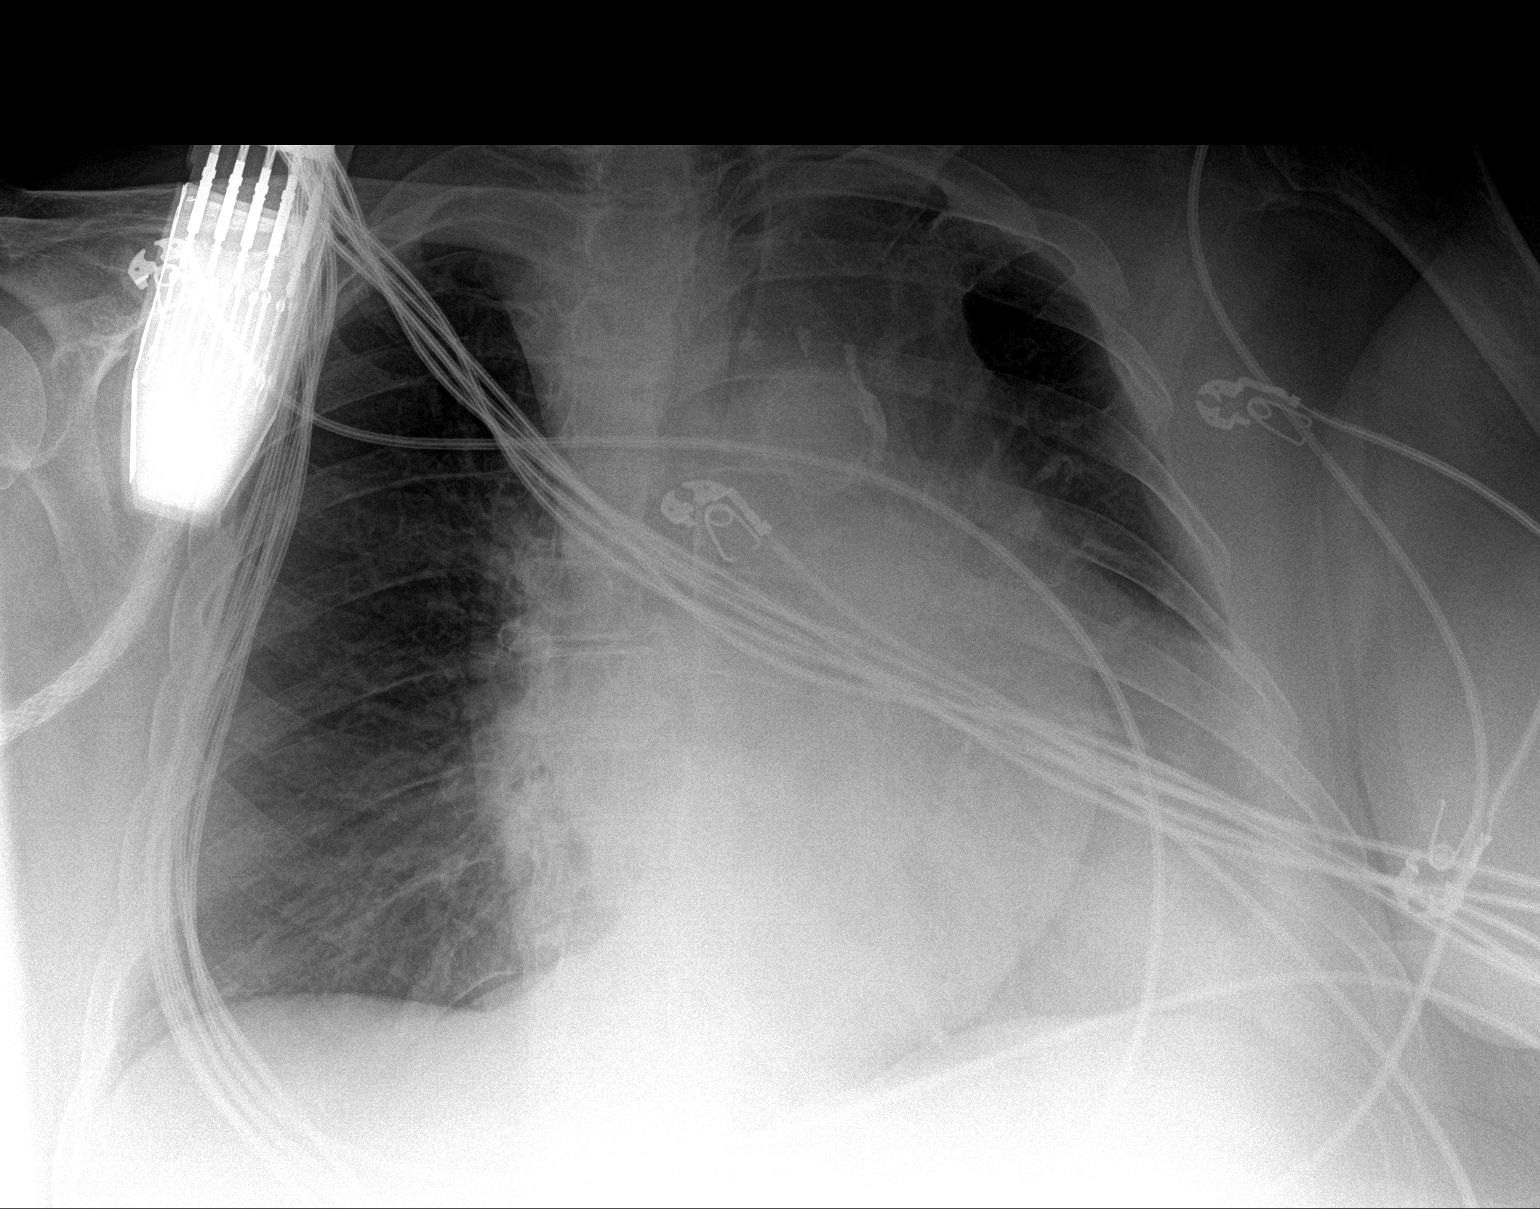

[1 of 1 positions shown; findings below may reference images not displayed]

FINDINGS: Stable cardiomegaly. Atherosclerosis of thoracic aorta is noted. No
pneumothorax is noted. Right lung is clear. Probable small left
pleural effusion is noted with associated atelectasis. Bony thorax
is unremarkable.
IMPRESSION: Stable cardiomegaly. Probable small left pleural effusion is noted
with associated atelectasis.

Aortic Atherosclerosis ([id number].B).

## 2019-12-30 IMAGING — US US RENAL
1 series · 14 of 21 positions shown · non-contrast
Comparison: Abdominal CT from yesterday

CLINICAL DATA: Acute kidney injury

EXAM:
RENAL / URINARY TRACT ULTRASOUND COMPLETE

[Series 1: us renal · 14 of 21 slices shown]
[im 1/21]
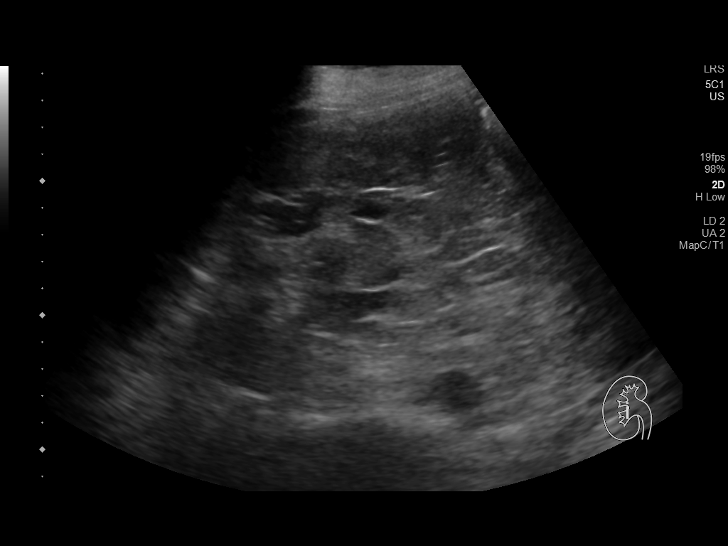
[im 3/21]
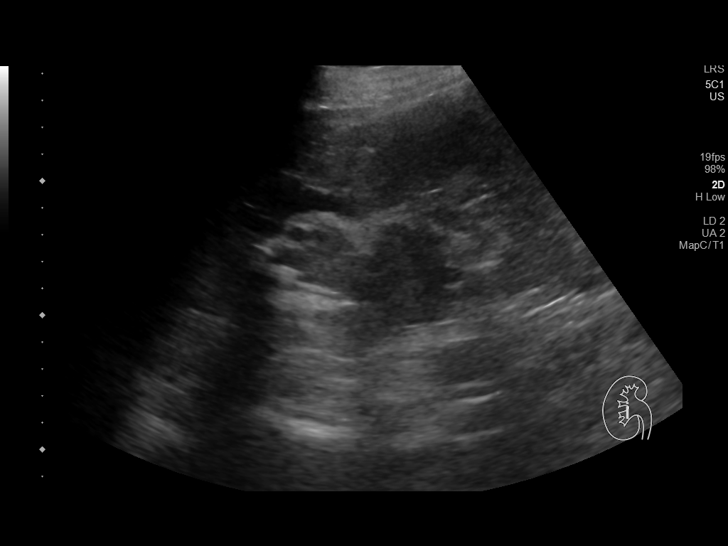
[im 4/21]
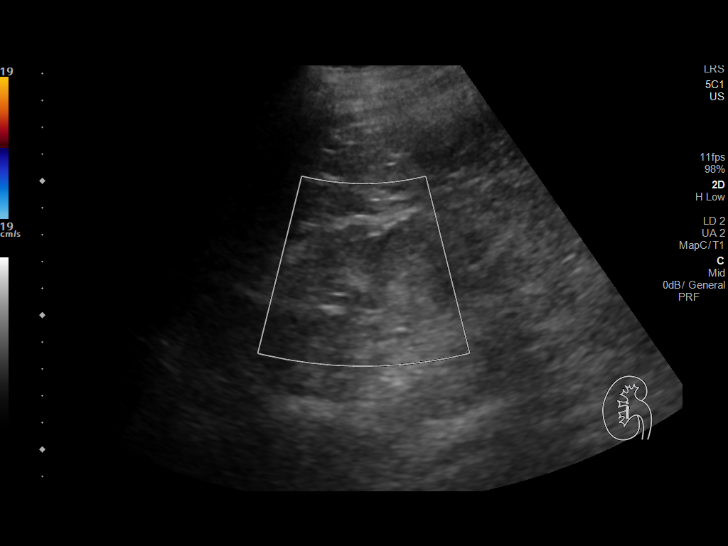
[im 6/21]
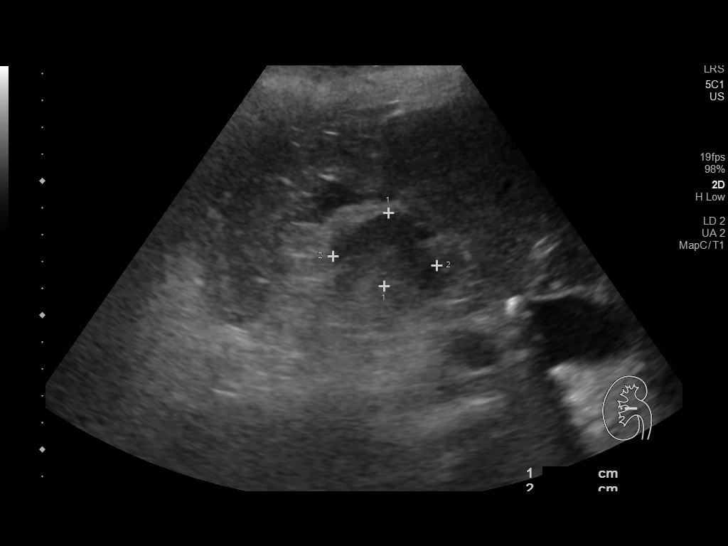
[im 7/21]
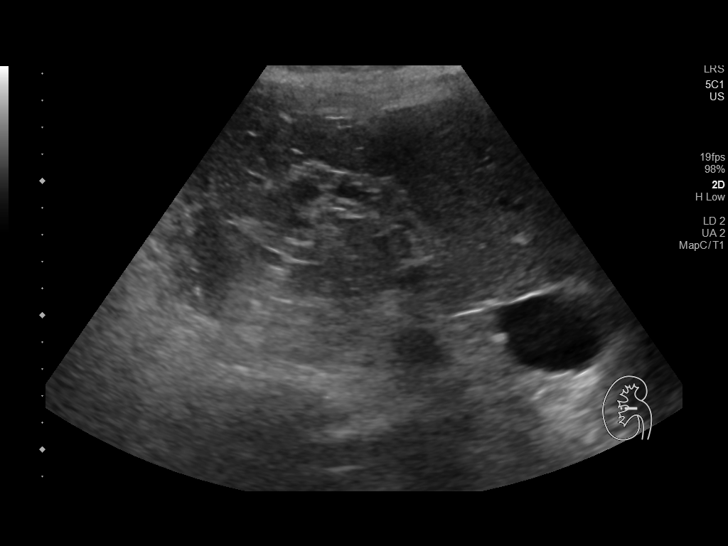
[im 9/21]
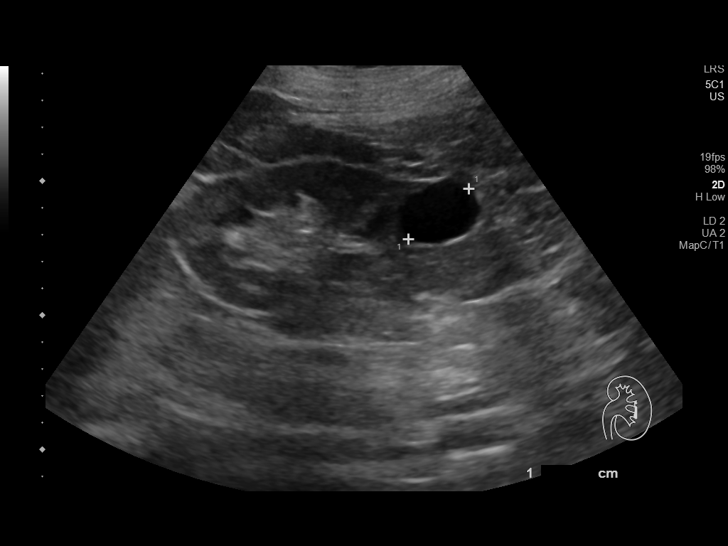
[im 10/21]
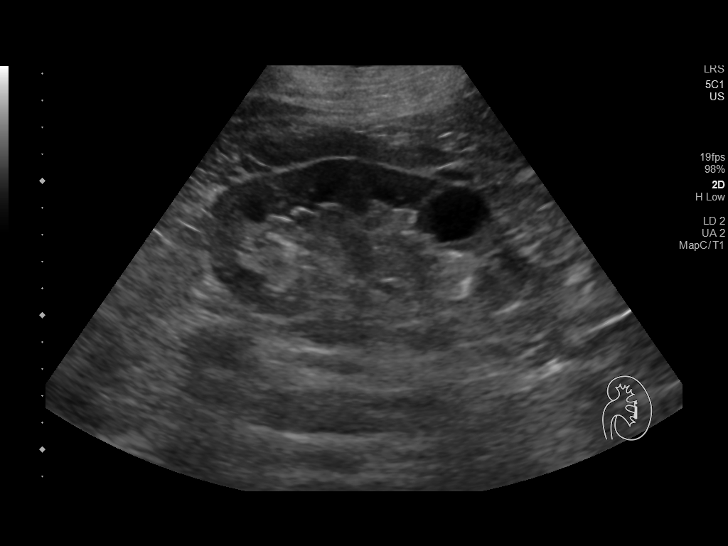
[im 12/21]
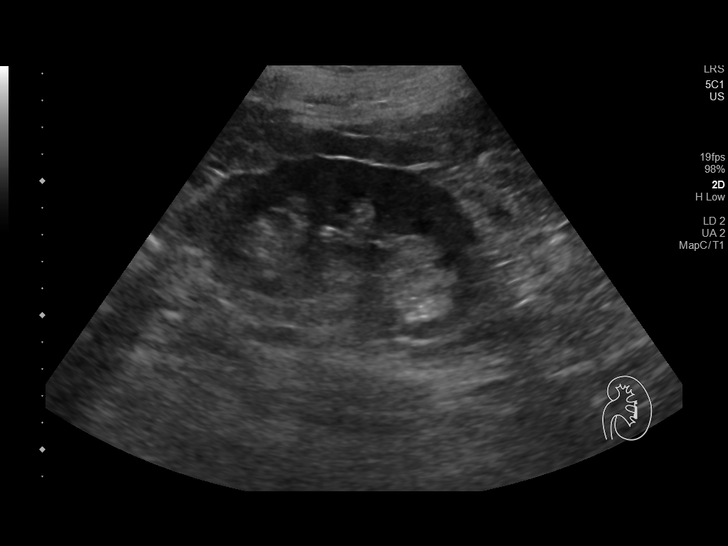
[im 13/21]
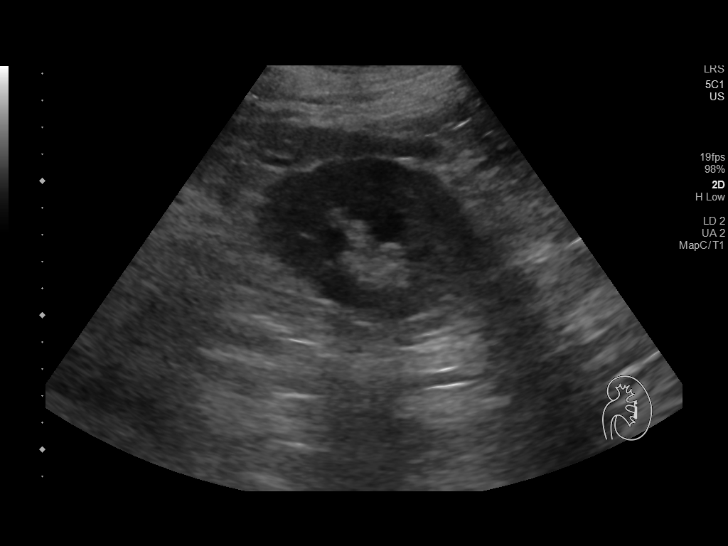
[im 15/21]
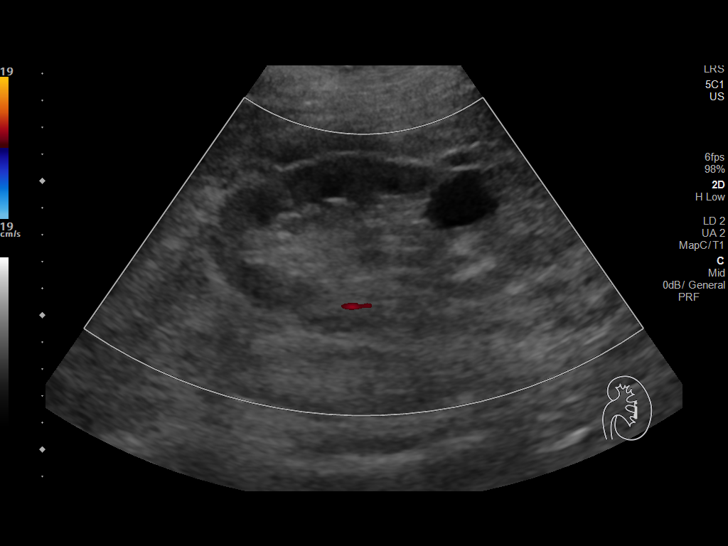
[im 16/21]
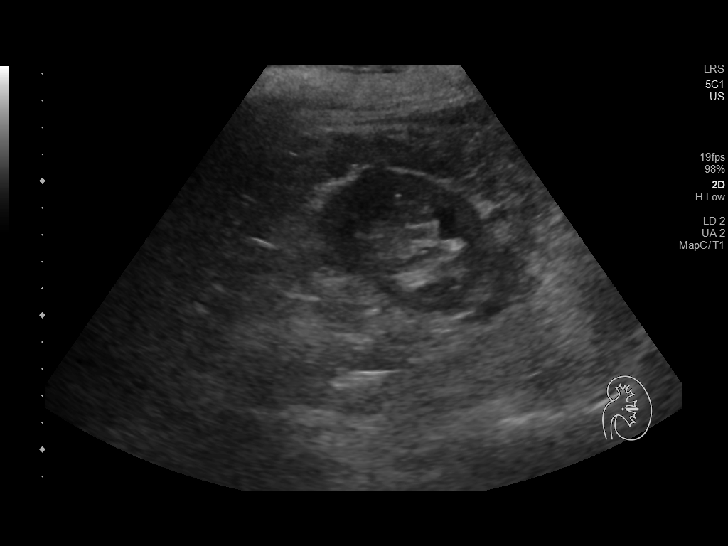
[im 18/21]
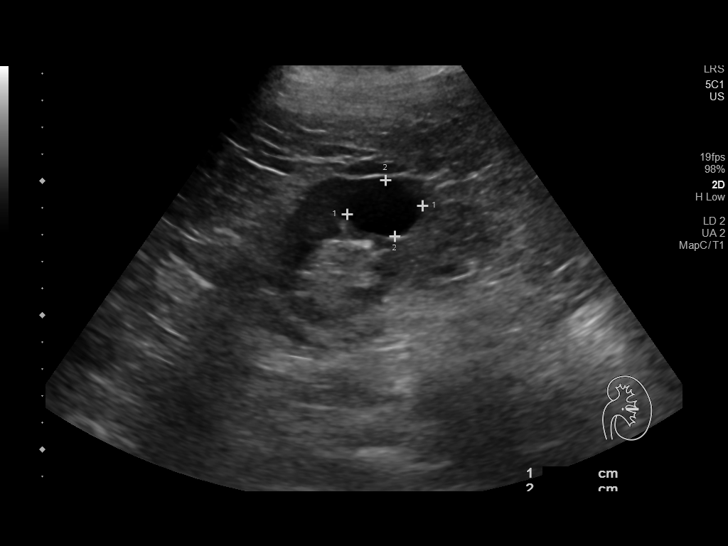
[im 19/21]
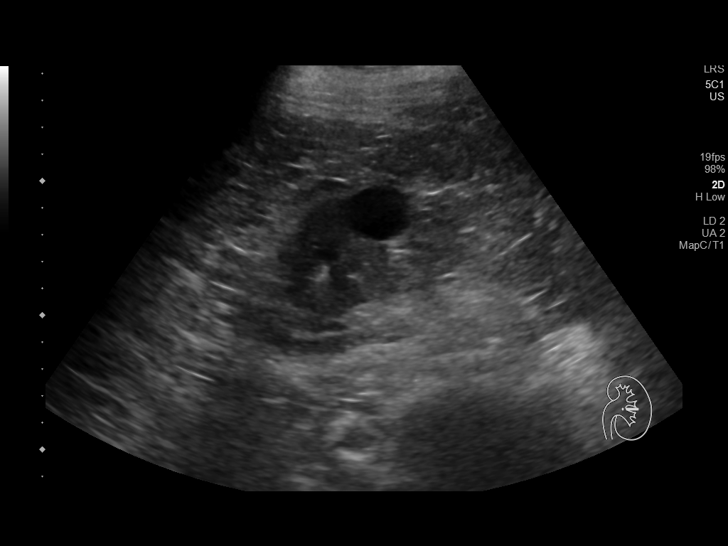
[im 21/21]
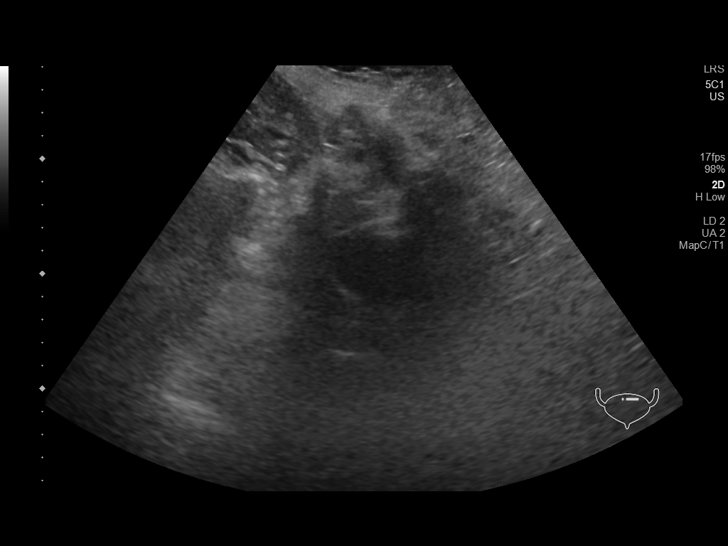

[14 of 21 positions shown; findings below may reference images not displayed]

FINDINGS: Right Kidney:

Severe atrophy with marked cortical thinning better seen by prior
CT. No hydronephrosis. Renal length is 8.8 cm.

Left Kidney:

Renal measurements: 12 x 6 x 7 cm = volume: 270 mL. Simple cyst
measuring 2.9 cm. No hydronephrosis or abnormal echogenicity.

Bladder:

Decompressed completely by a Foley catheter.
IMPRESSION: 1. Severe right renal atrophy.
2. Unremarkable left kidney.

## 2019-12-30 IMAGING — DX ABDOMEN - 1 VIEW
1 series · 1 of 1 positions shown · non-contrast
Comparison: CT dated March 28, 2019

CLINICAL DATA: Tube placement

EXAM:
ABDOMEN - 1 VIEW

[abdomen kub]
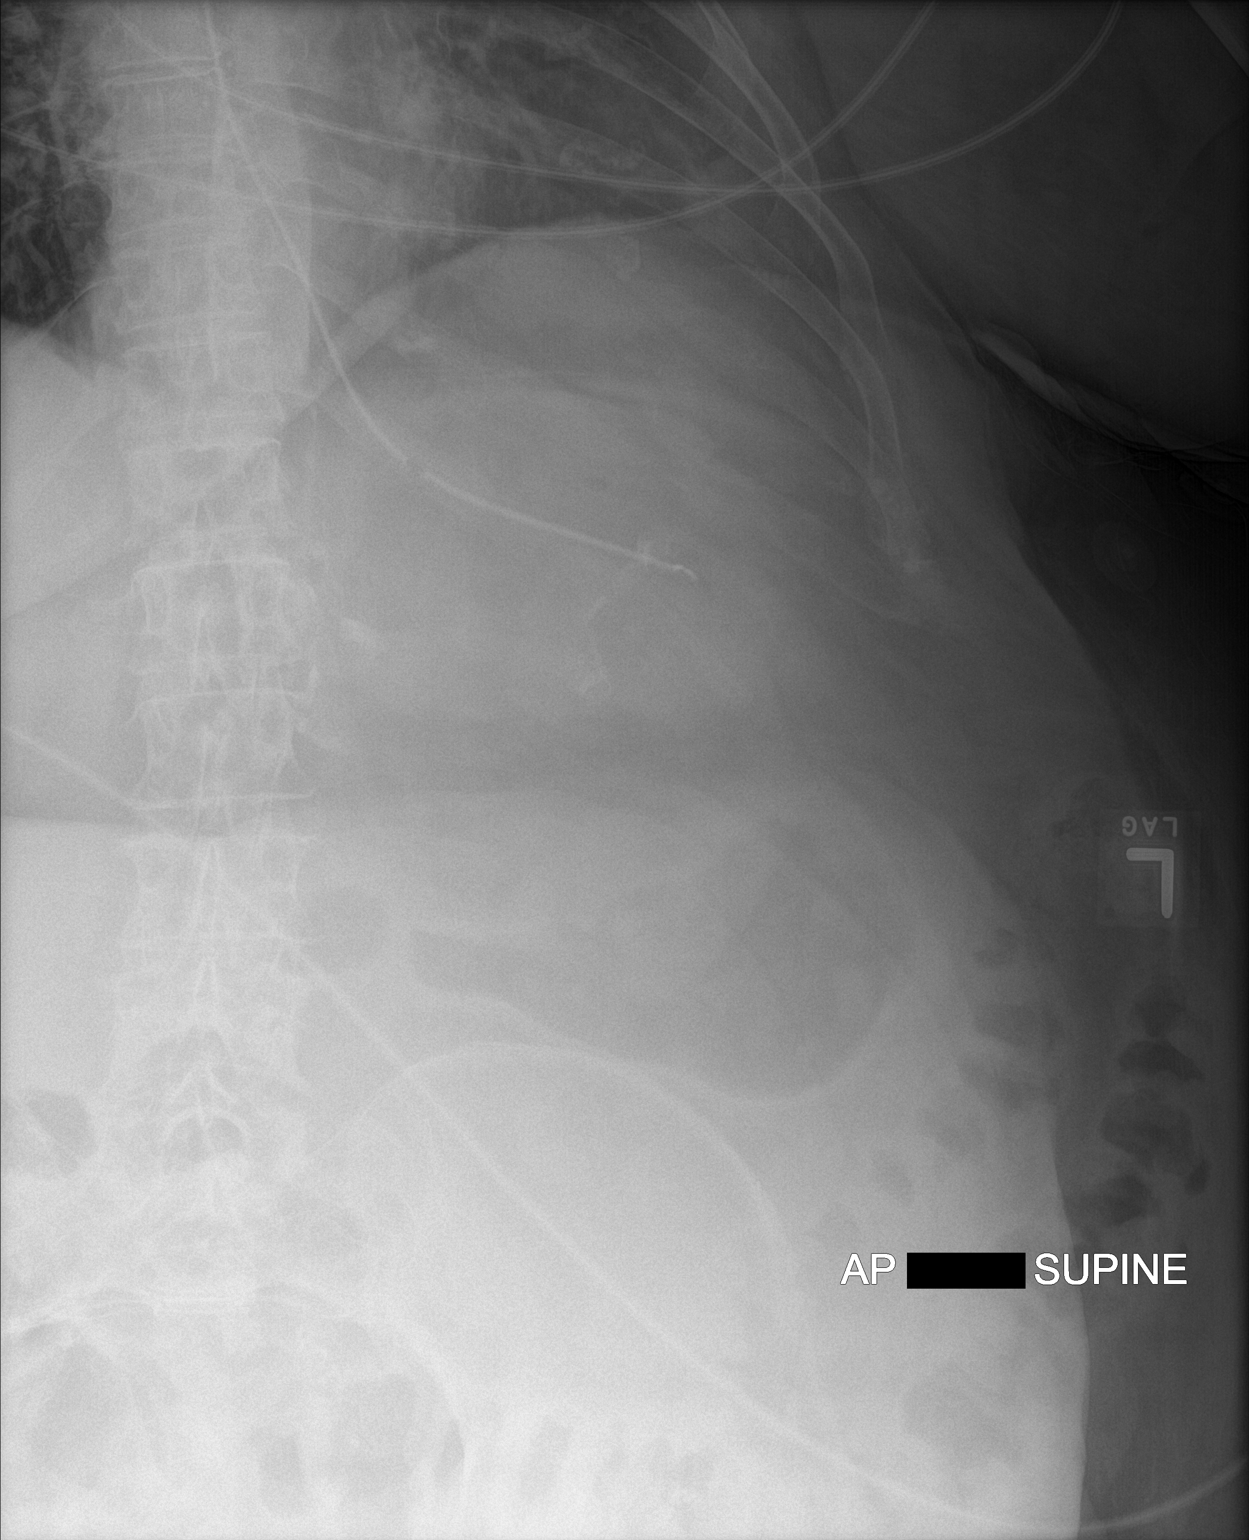

[1 of 1 positions shown; findings below may reference images not displayed]

FINDINGS: The enteric tube projects over the expected region of the gastric
body. The bowel gas pattern is nonspecific and nonobstructive.
IMPRESSION: Enteric tube tip projects over the expected region of the gastric
body.
